# Patient Record
Sex: Female | Born: 1975 | Race: Black or African American | Hispanic: No | Marital: Single | Smoking: Never smoker
Health system: Southern US, Community
[De-identification: ages and names within clinical notes are randomized; demographics above are authoritative.]

## PROBLEM LIST (undated history)

## (undated) DIAGNOSIS — R600 Localized edema: Secondary | ICD-10-CM

## (undated) DIAGNOSIS — R7303 Prediabetes: Secondary | ICD-10-CM

## (undated) DIAGNOSIS — R079 Chest pain, unspecified: Secondary | ICD-10-CM

## (undated) DIAGNOSIS — E039 Hypothyroidism, unspecified: Secondary | ICD-10-CM

## (undated) DIAGNOSIS — M549 Dorsalgia, unspecified: Secondary | ICD-10-CM

## (undated) DIAGNOSIS — F32A Depression, unspecified: Secondary | ICD-10-CM

## (undated) DIAGNOSIS — K219 Gastro-esophageal reflux disease without esophagitis: Secondary | ICD-10-CM

## (undated) DIAGNOSIS — K5900 Constipation, unspecified: Secondary | ICD-10-CM

## (undated) DIAGNOSIS — M2550 Pain in unspecified joint: Secondary | ICD-10-CM

## (undated) DIAGNOSIS — I10 Essential (primary) hypertension: Secondary | ICD-10-CM

## (undated) DIAGNOSIS — F419 Anxiety disorder, unspecified: Secondary | ICD-10-CM

## (undated) DIAGNOSIS — E669 Obesity, unspecified: Secondary | ICD-10-CM

## (undated) DIAGNOSIS — G4730 Sleep apnea, unspecified: Secondary | ICD-10-CM

## (undated) DIAGNOSIS — R002 Palpitations: Secondary | ICD-10-CM

## (undated) HISTORY — DX: Sleep apnea, unspecified: G47.30

## (undated) HISTORY — PX: KELOID EXCISION: SHX1856

## (undated) HISTORY — DX: Localized edema: R60.0

## (undated) HISTORY — DX: Depression, unspecified: F32.A

## (undated) HISTORY — DX: Chest pain, unspecified: R07.9

## (undated) HISTORY — DX: Dorsalgia, unspecified: M54.9

## (undated) HISTORY — DX: Palpitations: R00.2

## (undated) HISTORY — DX: Obesity, unspecified: E66.9

## (undated) HISTORY — DX: Constipation, unspecified: K59.00

## (undated) HISTORY — DX: Pain in unspecified joint: M25.50

## (undated) HISTORY — DX: Prediabetes: R73.03

## (undated) HISTORY — DX: Gastro-esophageal reflux disease without esophagitis: K21.9

---

## 2006-05-22 ENCOUNTER — Other Ambulatory Visit: Admission: RE | Admit: 2006-05-22 | Discharge: 2006-05-22 | Payer: Self-pay | Admitting: Obstetrics and Gynecology

## 2008-02-06 ENCOUNTER — Encounter: Admission: RE | Admit: 2008-02-06 | Discharge: 2008-02-06 | Payer: Self-pay | Admitting: Obstetrics and Gynecology

## 2008-06-05 ENCOUNTER — Ambulatory Visit (HOSPITAL_COMMUNITY): Admission: RE | Admit: 2008-06-05 | Discharge: 2008-06-05 | Payer: Self-pay | Admitting: Obstetrics and Gynecology

## 2008-06-22 ENCOUNTER — Inpatient Hospital Stay (HOSPITAL_COMMUNITY): Admission: AD | Admit: 2008-06-22 | Discharge: 2008-06-22 | Payer: Self-pay | Admitting: Obstetrics and Gynecology

## 2008-06-29 ENCOUNTER — Ambulatory Visit (HOSPITAL_COMMUNITY): Admission: RE | Admit: 2008-06-29 | Discharge: 2008-06-29 | Payer: Self-pay | Admitting: Obstetrics and Gynecology

## 2008-07-02 ENCOUNTER — Inpatient Hospital Stay (HOSPITAL_COMMUNITY): Admission: AD | Admit: 2008-07-02 | Discharge: 2008-07-02 | Payer: Self-pay | Admitting: Obstetrics and Gynecology

## 2008-07-03 ENCOUNTER — Inpatient Hospital Stay (HOSPITAL_COMMUNITY)
Admission: RE | Admit: 2008-07-03 | Discharge: 2008-07-05 | Payer: Self-pay | Source: Ambulatory Visit | Admitting: Obstetrics and Gynecology

## 2008-07-22 ENCOUNTER — Encounter: Admission: RE | Admit: 2008-07-22 | Discharge: 2008-07-22 | Payer: Self-pay | Admitting: Obstetrics and Gynecology

## 2009-09-01 ENCOUNTER — Ambulatory Visit (HOSPITAL_COMMUNITY): Admission: RE | Admit: 2009-09-01 | Discharge: 2009-09-01 | Payer: Self-pay | Admitting: Obstetrics and Gynecology

## 2009-09-03 ENCOUNTER — Inpatient Hospital Stay (HOSPITAL_COMMUNITY): Admission: AD | Admit: 2009-09-03 | Discharge: 2009-09-03 | Payer: Self-pay | Admitting: Obstetrics and Gynecology

## 2009-09-05 ENCOUNTER — Encounter (INDEPENDENT_AMBULATORY_CARE_PROVIDER_SITE_OTHER): Payer: Self-pay | Admitting: Obstetrics and Gynecology

## 2009-09-05 ENCOUNTER — Inpatient Hospital Stay (HOSPITAL_COMMUNITY): Admission: AD | Admit: 2009-09-05 | Discharge: 2009-09-07 | Payer: Self-pay | Admitting: Obstetrics and Gynecology

## 2009-12-26 ENCOUNTER — Ambulatory Visit (HOSPITAL_BASED_OUTPATIENT_CLINIC_OR_DEPARTMENT_OTHER): Admission: RE | Admit: 2009-12-26 | Discharge: 2009-12-26 | Payer: Self-pay | Admitting: Family Medicine

## 2010-01-01 ENCOUNTER — Ambulatory Visit: Payer: Self-pay | Admitting: Internal Medicine

## 2010-07-14 ENCOUNTER — Emergency Department (HOSPITAL_COMMUNITY): Admission: EM | Admit: 2010-07-14 | Discharge: 2010-07-14 | Payer: Self-pay | Admitting: Emergency Medicine

## 2010-07-29 ENCOUNTER — Emergency Department (HOSPITAL_COMMUNITY): Admission: EM | Admit: 2010-07-29 | Discharge: 2010-07-29 | Payer: Self-pay | Admitting: Emergency Medicine

## 2010-12-29 LAB — BASIC METABOLIC PANEL
BUN: 12 mg/dL (ref 6–23)
CO2: 26 mEq/L (ref 19–32)
Creatinine, Ser: 0.74 mg/dL (ref 0.4–1.2)
GFR calc Af Amer: >60 mL/min (ref >60)
GFR calc non Af Amer: >60 mL/min (ref >60)
Glucose, Bld: 88 mg/dL (ref 70–99)
Potassium: 3.4 mEq/L — ABNORMAL LOW (ref 3.5–5.1)
Sodium: 136 mEq/L (ref 135–145)

## 2010-12-29 LAB — DIFFERENTIAL
Basophils Absolute: 0 10*3/uL (ref 0.0–0.1)
Eosinophils Relative: 2 % (ref 0–5)
Lymphs Abs: 2.1 10*3/uL (ref 0.7–4.0)

## 2010-12-29 LAB — CBC
HCT: 41.6 % (ref 36.0–46.0)
Hemoglobin: 13.9 g/dL (ref 12.0–15.0)
MCH: 28.5 pg (ref 26.0–34.0)
MCHC: 33.4 g/dL (ref 30.0–36.0)
MCV: 85.3 fL (ref 78.0–100.0)
RBC: 4.87 MIL/uL (ref 3.87–5.11)
RDW: 13.1 % (ref 11.5–15.5)
WBC: 6.8 10*3/uL (ref 4.0–10.5)

## 2010-12-29 LAB — POCT CARDIAC MARKERS: CKMB, poc: 1.5 ng/mL (ref 1.0–8.0)

## 2011-01-18 LAB — CBC
Hemoglobin: 10.9 g/dL — ABNORMAL LOW (ref 12.0–15.0)
MCHC: 33.1 g/dL (ref 30.0–36.0)
MCV: 89.3 fL (ref 78.0–100.0)
MCV: 90.7 fL (ref 78.0–100.0)
RDW: 13.8 % (ref 11.5–15.5)
WBC: 14.6 10*3/uL — ABNORMAL HIGH (ref 4.0–10.5)
WBC: 8.9 10*3/uL (ref 4.0–10.5)

## 2011-01-18 LAB — COMPREHENSIVE METABOLIC PANEL
ALT: 15 U/L (ref 0–35)
Albumin: 3.2 g/dL — ABNORMAL LOW (ref 3.5–5.2)
Alkaline Phosphatase: 131 U/L — ABNORMAL HIGH (ref 39–117)
BUN: 7 mg/dL (ref 6–23)
Creatinine, Ser: 0.59 mg/dL (ref 0.4–1.2)
GFR calc Af Amer: >60 mL/min (ref >60)
GFR calc non Af Amer: >60 mL/min (ref >60)
Sodium: 135 mEq/L (ref 135–145)
Total Protein: 6.8 g/dL (ref 6.0–8.3)

## 2011-01-18 LAB — URIC ACID: Uric Acid, Serum: 4.7 mg/dL (ref 2.4–7.0)

## 2011-01-18 LAB — LACTATE DEHYDROGENASE: LDH: 188 U/L (ref 94–250)

## 2011-01-18 LAB — RPR: RPR Ser Ql: NONREACTIVE

## 2011-02-28 NOTE — H&P (Signed)
NAME:  Julia Mccoy, Julia Mccoy NO.:  [id number]   MEDICAL RECORD NO.:  [id number]          PATIENT TYPE:  MAT   LOCATION:  [facility]                          FACILITY:  [facility]   PHYSICIAN:  Sherron Monday, MD        DATE OF BIRTH:  Apr 11, 1976   DATE OF ADMISSION:  07/02/2008  DATE OF DISCHARGE:                              HISTORY & PHYSICAL   .   ADMITTING DIAGNOSES:  1. Intrauterine pregnancy at term.  2. Chronic hypertension.   PROCEDURE PLANNED:  Induction of labor.   HISTORY OF PRESENT ILLNESS:  A 35 year old African American female G1,  P0 who presents for induction of labor secondary to favorable cervix and  chronic hypertension with mild proteinuria.  She states she has had good  fetal movement, no loss of fluid, no vaginal bleeding, occasional  contractions.  Her pregnancy has been followed closely secondary to  chronic hypertension.  She has been on labetalol 300 twice daily with  decent control of her blood pressures.  She has also been monitored with  biweekly NSTs and frequent BPP secondary to the nonreactivity of the  NST.    Past Medical History:  1. Chronic Hypertension  2. Obesity  3. thyroid disease   Past Surgical History:  None   Past Ob/Gyn History  G1 is the present pregnancy  no abnormal pap smears or sexually transmitted diseases.   MEDICATIONS:  1. Labetalol 300 twice daily.  2. Synthroid.   ALLERGIES:  No known drug allergies.   SOCIAL HISTORY:  Denies alcohol, tobacco or drug use.  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  Patient denies a family history.   PRENATAL LABS:  Are the following:  First trimester screen is negative.  Hemoglobin 11.1, platelets 182,000.  Blood type O+, antibody screen  negative, sickle cell screen normal.  Pap smear on September 2008 was  within normal limits.  Gonorrhea negative, chlamydia negative, RPR  nonreactive, rubella immune, hepatitis B surface antigen negative, HIV  negative, TSH was within  normal limits, cystic fibrosis screen was  negative, normal nuchal thickness, Glucola of 119.  Group B strep was  positive.  Baseline 24-hour urine revealed a total protein of 137 mg and  an adequate sample ultrasound at 19 weeks revealed [facility] of July 09, 2008.  Normal anatomy, somewhat a limited heart anatomy, anterior  placenta and a female fetus.  Cervical length was noted to be shortened in  her prenatal care.  Her anatomy was followed up and had continued to be  of limited evaluation.  A followup ultrasound at 20 weeks revealed  normal heart anatomy and stable cervical length.  BPPs were normal  throughout her prenatal care.    Physical Exam:   Afebrile, Vital signs stable  General - no apparent distress  Cardiovascular - regular rate and rhythm  Lungs clear to auscultation Bilaterally  Abdomen soft, fundus nontender  Extremity symmetric, nontender   Fetal Heart tones 140's   ASSESSMENT/PLAN:  A 35 year old African American female G1 who will be  admitted for induction of labor secondary  to favorable cervix and  chronic hypertension with mild proteinuria.  She will be admitted on the  18th.  Her water will be broken and she will be started on Pitocin to  augment her labor.  She voices understanding to all this and wishes to  proceed.      Sherron Monday, MD  Electronically Signed     JB/MEDQ  D:  07/02/2008  T:  07/02/2008  Job:  [id number]

## 2011-02-28 NOTE — Discharge Summary (Signed)
NAME:  Julia Mccoy, SCHLIE NO.:  [id number]   MEDICAL RECORD NO.:  [id number]          PATIENT TYPE:  INP   LOCATION:  9136                          FACILITY:  [facility]   PHYSICIAN:  Sherron Monday, MD        DATE OF BIRTH:  [birth year]   DATE OF ADMISSION:  07/03/2008  DATE OF DISCHARGE:  07/05/2008                               DISCHARGE SUMMARY   ADMITTING DIAGNOSES:  Intrauterine pregnancy at term, chronic  hypertension, for induction of labor secondary to favorable cervix and  chronic hypertension.   DISCHARGE DIAGNOSES:  Intrauterine pregnancy at term, chronic  hypertension, for induction of labor secondary to favorable cervix and  chronic hypertension, infant delivered by a spontaneous vaginal  delivery.   HISTORY OF PRESENT ILLNESS:  A 35 year old G1, P0 who presents for labor  secondary to favorable cervix and chronic hypertension with mild  proteinuria.  She states she has had good fetal movement, no loss of  fluid, no vaginal bleeding and occasional contractions.  Her pregnancy  is followed closely secondary to chronic hypertension and during her  pregnancy she has been on labetalol twice daily for decent control of  her blood pressures.  She has been monitored with biweekly NST and  frequent ultrasound for growth or frequent BPP secondary to  nonreactivity or the nonstress test.   PAST MEDICAL HISTORY:  Significant for chronic hypertension and thyroid  disease.   PAST SURGICAL HISTORY:  She has had surgery on her keloids.   PAST OB/GYN HISTORY:  G1 is the present pregnancy.  She did have an  abnormal Pap smear and colposcopy.  No sexually transmitted diseases.   MEDICATIONS:  1. Labetalol 600 twice daily.  2. Prenatal vitamin.  3. Levothyroxine.   ALLERGIES:  No known drug allergies.   SOCIAL HISTORY:  She denies alcohol, tobacco, or drug use.  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   FAMILY HISTORY:  Her parents have chronic hypertension and aunt had  colon and breast cancer.  Father with diabetes.   PRENATAL LABORATORY DATA:  First trimester screen was within normal  limits.  Hemoglobin 11.1 and platelets 182,000.  O positive.  Antibody  screen negative.  Sickle cell within normal limits.  Pap smear within  normal limits.  Gonorrhea negative.  Chlamydia negative.  RPR  nonreactive.  Rubella immune.  Hepatitis C surface antigen negative.  HIV negative.  TSH within normal limits.  Cystic fibrosis screen  negative.  Normal nuchal thickness.  Glucose 119.  Group strep B screen  was positive.  Baseline 24-hour urine revealed a total protein of 137 mg  and an adequate sample.   Ultrasound 19 weeks revealed [facility] of July 09, 2008, normal anatomy,  somewhat limited herniotomy.  Anterior placenta and  female fetus.  Cervical length was noted to be shortened in her prenatal care.  Anatomy  and growth were followed.  BCCs were normal as well as her prenatal  care.   PHYSICAL EXAMINATION:  On admission, her exam was benign.  Fetal heart  tones were reactive.  Vaginal exam was 2-3  cm dilated, 50% effaced, and  -2 station. Her labor was monitored closely.  She was given Pitocin to  augment her labor as well as after the penicillin had been given for the  group B strep, an artifical rupture of membranes was performed.  Therefore by confirming rupture of membranes that she had during her  intrapartum course.  Scalp lead was also placed.  She was monitored and  progressed all the way to complete +2 , pushed well for approximately 30  minutes to deliver a viable female infant at 2321 with Apgars of 9 at 1  minute and 9 at 5 minutes and a weight of 7 pounds 1 ounce, and infant  delivered in LOP.  OB laceration was noted, second degree perineal and  the right labial repaired with 3-0 Vicryl in a typical fashion.  Placenta was delivered at 23:25 and was inspected intact.  EBL was  approximately 500 mL.  Postpartum course, circumcision was performed  for  the female infant without complications after discussing the risks,  benefits, and alternatives with the patient.  Postpartum course was  relatively uncomplicated.  Her blood pressure remained well controlled  and she continued on her labetalol.  As she is breast-feeding she will  continue on 300 twice daily of labetalol.  She was doing well on  postpartum day 2 with her pain well controlled, tolerating a regular  diet.  Normal lochia and she is working on breast-feeding.  She will be  discharged home with a prescription for Motrin, Vicodin and prenatal  vitamins as well as instructions on labetalol.  She will return to the  office in 2 weeks for a blood pressure check.  She was given routine  discharge instructions and advised to call if any questions or problems.      Sherron Monday, MD  Electronically Signed     JB/MEDQ  D:  07/05/2008  T:  07/06/2008  Job:  [id number]

## 2011-07-17 LAB — COMPREHENSIVE METABOLIC PANEL
ALT: 18
Albumin: 3.1 — ABNORMAL LOW
Albumin: 3.1 — ABNORMAL LOW
Alkaline Phosphatase: 98
BUN: 7
CO2: 23
Chloride: 106
Creatinine, Ser: 0.53
Creatinine, Ser: 0.55
GFR calc Af Amer: >60
Potassium: 3.8
Total Protein: 6.2

## 2011-07-17 LAB — CBC
HCT: 32.8 — ABNORMAL LOW
HCT: 34.5 — ABNORMAL LOW
Hemoglobin: 11 — ABNORMAL LOW
MCV: 88.8
Platelets: 151
RBC: 3.93
WBC: 4.6
WBC: 4.9
WBC: 9.5

## 2011-07-17 LAB — URIC ACID
Uric Acid, Serum: 4.8
Uric Acid, Serum: 5.2

## 2011-07-17 LAB — RPR: RPR Ser Ql: NONREACTIVE

## 2012-01-30 ENCOUNTER — Encounter (HOSPITAL_BASED_OUTPATIENT_CLINIC_OR_DEPARTMENT_OTHER): Payer: Self-pay | Admitting: *Deleted

## 2012-01-30 ENCOUNTER — Emergency Department (HOSPITAL_BASED_OUTPATIENT_CLINIC_OR_DEPARTMENT_OTHER)
Admission: EM | Admit: 2012-01-30 | Discharge: 2012-01-30 | Disposition: A | Discharge status: Home / Self Care | Payer: 59 | Attending: Emergency Medicine | Admitting: Emergency Medicine

## 2012-01-30 DIAGNOSIS — E079 Disorder of thyroid, unspecified: Secondary | ICD-10-CM | POA: Insufficient documentation

## 2012-01-30 DIAGNOSIS — I10 Essential (primary) hypertension: Secondary | ICD-10-CM | POA: Insufficient documentation

## 2012-01-30 DIAGNOSIS — R51 Headache: Secondary | ICD-10-CM | POA: Insufficient documentation

## 2012-01-30 DIAGNOSIS — R350 Frequency of micturition: Secondary | ICD-10-CM

## 2012-01-30 HISTORY — DX: Essential (primary) hypertension: I10

## 2012-01-30 LAB — URINALYSIS, ROUTINE W REFLEX MICROSCOPIC
Glucose, UA: NEGATIVE mg/dL
Hgb urine dipstick: NEGATIVE
Specific Gravity, Urine: 1.026 (ref 1.005–1.030)
pH: 5 (ref 5.0–8.0)

## 2012-01-30 LAB — DIFFERENTIAL
Eosinophils Relative: 0 % (ref 0–5)
Lymphocytes Relative: 21 % (ref 12–46)
Lymphs Abs: 0.8 10*3/uL (ref 0.7–4.0)
Monocytes Absolute: 0.6 10*3/uL (ref 0.1–1.0)

## 2012-01-30 LAB — COMPREHENSIVE METABOLIC PANEL
ALT: 20 U/L (ref 0–35)
CO2: 27 mEq/L (ref 19–32)
Calcium: 9.1 mg/dL (ref 8.4–10.5)
Chloride: 102 mEq/L (ref 96–112)
Creatinine, Ser: 0.8 mg/dL (ref 0.50–1.10)
GFR calc Af Amer: >90 mL/min (ref >90)
GFR calc non Af Amer: >90 mL/min (ref >90)
Glucose, Bld: 87 mg/dL (ref 70–99)
Sodium: 138 mEq/L (ref 135–145)
Total Bilirubin: 0.4 mg/dL (ref 0.3–1.2)

## 2012-01-30 LAB — CBC
HCT: 37.6 % (ref 36.0–46.0)
MCV: 85.3 fL (ref 78.0–100.0)
RBC: 4.41 MIL/uL (ref 3.87–5.11)
WBC: 3.8 10*3/uL — ABNORMAL LOW (ref 4.0–10.5)

## 2012-01-30 NOTE — ED Notes (Signed)
States she has had urinary symptoms, aching all over and headache. Urine sent from triage.

## 2012-01-30 NOTE — ED Notes (Signed)
Pt presents with multiple sx including frequent urination weakness, HA and "felt feverish" sx began this weekend

## 2012-01-30 NOTE — ED Provider Notes (Signed)
History     CSN: [id number]  Arrival date & time 01/30/12  2120   First MD Initiated Contact with Patient 01/30/12 2202      Chief Complaint  Patient presents with  . Urinary Frequency    (Consider location/radiation/quality/duration/timing/severity/associated sxs/prior treatment) Patient is a 36 y.o. female presenting with frequency. The history is provided by the patient.  Urinary Frequency This is a new problem. Episode onset: 3 days ago. The problem occurs constantly. The problem has been gradually worsening. Associated symptoms include headaches. Pertinent negatives include no chest pain, no abdominal pain and no shortness of breath. The symptoms are aggravated by nothing. The symptoms are relieved by nothing. She has tried nothing for the symptoms.    Past Medical History  Diagnosis Date  . Hypertension   . Thyroid disease     History reviewed. No pertinent past surgical history.  History reviewed. No pertinent family history.  History  Substance Use Topics  . Smoking status: Never Smoker   . Smokeless tobacco: Not on file  . Alcohol Use: No    OB History    Grav Para Term Preterm Abortions TAB SAB Ect Mult Living                  Review of Systems  Respiratory: Negative for shortness of breath.   Cardiovascular: Negative for chest pain.  Gastrointestinal: Negative for abdominal pain.  Genitourinary: Positive for frequency.  Neurological: Positive for headaches.  All other systems reviewed and are negative.    Allergies  Review of patient's allergies indicates no known allergies.  Home Medications   Current Outpatient Rx  Name Route Sig Dispense Refill  . AMLODIPINE BESYLATE 10 MG PO TABS Oral Take 10 mg by mouth daily.    Marland Kitchen LEVONORGESTREL 20 MCG/24HR IU IUD Intrauterine 1 each by Intrauterine route once. Inserted January 2011    . LEVOTHYROXINE SODIUM 88 MCG PO TABS Oral Take 88 mcg by mouth daily.    Marland Kitchen LOSARTAN POTASSIUM-HCTZ 50-12.5 MG PO TABS  Oral Take 1 tablet by mouth daily.      BP 143/101  Pulse 100  Temp(Src) 99.9 F (37.7 C) (Oral)  Resp 20  SpO2 100%  LMP 01/16/2012  Physical Exam  Nursing note and vitals reviewed. Constitutional: She is oriented to person, place, and time. She appears well-developed and well-nourished. No distress.  HENT:  Head: Normocephalic and atraumatic.  Neck: Normal range of motion. Neck supple.  Cardiovascular: Normal rate and regular rhythm.  Exam reveals no gallop and no friction rub.   No murmur heard. Pulmonary/Chest: Effort normal and breath sounds normal. No respiratory distress. She has no wheezes.  Abdominal: Soft. Bowel sounds are normal. She exhibits no distension. There is no tenderness.  Musculoskeletal: Normal range of motion.  Neurological: She is alert and oriented to person, place, and time.  Skin: Skin is warm and dry. She is not diaphoretic.    ED Course  Procedures (including critical care time)  Labs Reviewed  URINALYSIS, ROUTINE W REFLEX MICROSCOPIC - Abnormal; Notable for the following:    Color, Urine AMBER (*) BIOCHEMICALS MAY BE AFFECTED BY COLOR   Bilirubin Urine SMALL (*)    Ketones, ur 15 (*)    All other components within normal limits  PREGNANCY, URINE  CBC  DIFFERENTIAL  COMPREHENSIVE METABOLIC PANEL   No results found.   No diagnosis found.    MDM  The labs look okay.  The patient's main complaint is urinary frequency.  I was concerned about her sugar, however there was no glucose in the urine and the cmp showed a glucose of 87.  Her symptoms are difficult to explain but do not appear emergent.  She will be discharged to home with follow up prn if her symptoms do not resolve or worsen.          Geoffery Lyons, MD 01/30/12 [phone]

## 2012-01-30 NOTE — Discharge Instructions (Signed)
Urinary Frequency The number of times a normal person urinates depends upon how much liquid they take in and how much liquid they are losing. If the temperature is hot and there is high humidity then the person will sweat more and usually breathe a little more frequently. These factors decrease the amount of frequency of urination that would be considered normal. The amount you drink is easily determined, but the amount of fluid lost is sometimes more difficult to calculate.  Fluid is lost in two ways:  Sensible fluid loss is usually measured by the amount of urine that you get rid of. Losses of fluid can also occur with diarrhea.   Insensible fluid loss is more difficult to measure. It is caused by evaporation. Insensible loss of fluid occurs through breathing and sweating. It usually ranges from a little less than a quart to a little more than a quart of fluid a day.  In normal temperatures and activity levels the average person may urinate 4 to 7 times in a 24-hour period. Needing to urinate more often than that could indicate a problem. If one urinates 4 to 7 times in 24 hours and has large volumes each time, that could indicate a different problem from one who urinates 4 to 7 times a day and has small volumes. The time of urinating is also an important. Most urinating should be done during the waking hours. Getting up at night to urinate frequently can indicate some problems. CAUSES  The bladder is the organ in your lower abdomen that holds urine. Like a balloon, it swells some as it fills up. Your nerves sense this and tell you it is time to head for the bathroom. There are a number of reasons that you might feel the need to urinate more often than usual. They include:  Urinary tract infection. This is usually associated with other signs such as burning when you urinate.   In men, problems with the prostate (a walnut-size gland that is located near the tube that carries urine out of your body).  There are two reasons why the prostate can cause an increased frequency of urination:   An enlarged prostate that does not let the bladder empty well. If the bladder only half empties when you urinate then it only has half the capacity to fill before you have to urinate again.   The nerves in the bladder become more hypersensitive with an increased size of the prostate even if the bladder empties completely.   Pregnancy.   Obesity. Excess weight is more likely to cause a problem for women more than for men.   Bladder stones or other bladder problems.   Caffeine.   Alcohol.   Medications. For example, drugs that help the body get rid of extra fluid (diuretics) increase urine production. Some other medicines must be taken with lots of fluids.   Muscle or nerve weakness. This might be the result of a spinal cord injury, a stroke, multiple sclerosis or Parkinson's disease.   Long-standing diabetes can decrease the sensation of the bladder. This loss of sensation makes it harder to sense the bladder needs to be emptied. Over a period of years the bladder is stretched out by constant overfilling. This weakens the bladder muscles so that the bladder does not empty well and has less capacity to fill with new urine.   Interstitial cystitis (also called painful bladder syndrome). This condition develops because the tissues that line the insider of the bladder are inflamed (  inflammation is the body's way of reacting to injury or infection). It causes pain and frequent urination. It occurs in women more often than in men.  DIAGNOSIS   To decide what might be causing your urinary frequency, your healthcare provider will probably:   Ask about symptoms you have noticed.   Ask about your overall health. This will include questions about any medications you are taking.   Do a physical examination.   Order some tests. These might include:   A blood test to check for diabetes or other health issues  that could be contributing to the problem.   Urine testing. This could measure the flow of urine and the pressure on the bladder.   A test of your neurological system (the brain, spinal cord and nerves). This is the system that senses the need to urinate.   A bladder test to check whether it is emptying completely when you urinate.   Cytoscopy. This test uses a thin tube with a tiny camera on it. It offers a look inside your urethra and bladder to see if there are problems.   Imaging tests. You might be given a contrast dye and then asked to urinate. X-rays are taken to see how your bladder is working.  TREATMENT  It is important for you to be evaluated to determine if the amount or frequency that you have is unusual or abnormal. If it is found to be abnormal the cause should be determined and this can usually be found out easily. Depending upon the cause treatment could include medication, stimulation of the nerves, or surgery. There are not too many things that you can do as an individual to change your urinary frequency. It is important that you balance the amount of fluid intake needed to compensate for your activity and the temperature. Medical problems will be diagnosed and taken care of by your physician. There is no particular bladder training such as Kegel's exercises that you can do to help urinary frequency. This is an exercise this is usually done for people who have leaking of urine when they laugh cough or sneeze. HOME CARE INSTRUCTIONS   Take any medications your healthcare provider prescribed or suggested. Follow the directions carefully.   Practice any lifestyle changes that are recommended. These might include:   Drinking less fluid or drinking at different times of the day. If you need to urinate often during the night, for example, you may need to stop drinking fluids early in the evening.   Cutting down on caffeine or alcohol. They both can make you need to urinate more  often than normal. Caffeine is found in coffee, tea and sodas.   Losing weight, if that is recommended.   Keep a journal or a log. You might be asked to record how much you drink and when and when you feel the need to urinate. This will also help evaluate how well the treatment provided by your physician is working.  SEEK MEDICAL CARE IF:   Your need to urinate often gets worse.   You feel increased pain or irritation when you urinate.   You notice blood in your urine.   You have questions about any medications that your healthcare provider recommended.   You notice blood, pus or swelling at the site of any test or treatment procedure.   You develop a fever of more than 100.5 F (38.1 C).  SEEK IMMEDIATE MEDICAL CARE IF:  You develop a fever of more than 102.0   F (38.9 C). Document Released: 07/29/2009 Document Revised: 09/21/2011 Document Reviewed: 07/29/2009 Exit[employer] Patient Information 2012 [employer]. 

## 2014-07-24 ENCOUNTER — Encounter: Payer: Self-pay | Admitting: Dietician

## 2014-07-24 ENCOUNTER — Encounter: Payer: 59 | Attending: Family Medicine | Admitting: Dietician

## 2014-07-24 DIAGNOSIS — Z713 Dietary counseling and surveillance: Secondary | ICD-10-CM | POA: Diagnosis not present

## 2014-07-24 DIAGNOSIS — E669 Obesity, unspecified: Secondary | ICD-10-CM | POA: Diagnosis present

## 2014-07-24 NOTE — Progress Notes (Signed)
  Medical Nutrition Therapy:  Appt start time: 1115 end time:  1200.   Assessment:  Primary concerns today: Julia Mccoy was referred for weight management and hypertension. 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 She picks up her kids and goes through their night routine and then she might do work at home in the evening.  She lives with her children.  She eats out 5 times a week to places like [employer], fast-food, greek food, pizza, or Congochinese food.    Learning Readiness:  Ready  MEDICATIONS: see list   DIETARY INTAKE:  Usual eating pattern includes 3 meals and 0 snacks per day.  24-hr recall:  B (9:30-10 AM): yogurt and belvita crackers and fruit if she has a morning meeting, [place] if not Snk: none  L (1 PM): out to eat, mushroom swiss burger, potato wedges with sweet tea Snk ( PM): none D (12 AM): rice, chicken Snk ( PM): none Beverages: sweet tea at meals  Usual physical activity: none currently  Progress Towards Goal(s):  In progress.   Nutritional Diagnosis:  NB-1.1 Food and nutrition-related knowledge deficit As related to no prior formal nutrition education.  As evidenced by patient report.     Intervention:  Nutrition education and counseling for healthy, sustainable weight loss and hypertension management through dietary interventions.  Goals: Make breakfast at home each morning during the week (5 days a week). Pack your lunch at least 3 days a week.   Try to limit sodium to 500-600 mg per meal and 100-200 mg per snack. (<2,300 mg per day) Begin exercise routine.  Walk 15 min in the morning 3 days a week.  Teaching Method Utilized: Visual Auditory  Handouts given during visit include:  Hypertension DASH tips  Low Sodium Flavoring Options  MyPlate Portion Method  Fast Food Options  Heart Healthy Foods  Barriers to  learning/adherence to lifestyle change: none  Demonstrated degree of understanding via:  Teach Back   Monitoring/Evaluation:  Dietary intake, exercise, labs, and body weight in 2 month(s).

## 2014-07-24 NOTE — Patient Instructions (Signed)
Make breakfast at home each morning during the week (5 days a week). Pack your lunch at least 3 days a week.   Try to limit sodium to 500-600 mg per meal and 100-200 mg per snack. Begin exercise routine.  Walk 15 min in the morning 3 days a week.

## 2014-10-02 ENCOUNTER — Ambulatory Visit: Payer: 59 | Admitting: Dietician

## 2015-08-26 ENCOUNTER — Emergency Department (HOSPITAL_BASED_OUTPATIENT_CLINIC_OR_DEPARTMENT_OTHER)
Admission: EM | Admit: 2015-08-26 | Discharge: 2015-08-26 | Disposition: A | Discharge status: Home / Self Care | Payer: Medicaid Other | Attending: Emergency Medicine | Admitting: Emergency Medicine

## 2015-08-26 ENCOUNTER — Encounter (HOSPITAL_BASED_OUTPATIENT_CLINIC_OR_DEPARTMENT_OTHER): Payer: Self-pay

## 2015-08-26 DIAGNOSIS — Y9389 Activity, other specified: Secondary | ICD-10-CM | POA: Insufficient documentation

## 2015-08-26 DIAGNOSIS — X509XXA Other and unspecified overexertion or strenuous movements or postures, initial encounter: Secondary | ICD-10-CM | POA: Insufficient documentation

## 2015-08-26 DIAGNOSIS — E079 Disorder of thyroid, unspecified: Secondary | ICD-10-CM | POA: Insufficient documentation

## 2015-08-26 DIAGNOSIS — Y990 Civilian activity done for income or pay: Secondary | ICD-10-CM | POA: Diagnosis not present

## 2015-08-26 DIAGNOSIS — Y9289 Other specified places as the place of occurrence of the external cause: Secondary | ICD-10-CM | POA: Insufficient documentation

## 2015-08-26 DIAGNOSIS — S3992XA Unspecified injury of lower back, initial encounter: Secondary | ICD-10-CM | POA: Diagnosis present

## 2015-08-26 DIAGNOSIS — E669 Obesity, unspecified: Secondary | ICD-10-CM | POA: Insufficient documentation

## 2015-08-26 DIAGNOSIS — S39012A Strain of muscle, fascia and tendon of lower back, initial encounter: Secondary | ICD-10-CM | POA: Diagnosis not present

## 2015-08-26 DIAGNOSIS — I10 Essential (primary) hypertension: Secondary | ICD-10-CM | POA: Diagnosis not present

## 2015-08-26 DIAGNOSIS — Z79899 Other long term (current) drug therapy: Secondary | ICD-10-CM | POA: Diagnosis not present

## 2015-08-26 MED ORDER — CYCLOBENZAPRINE HCL 5 MG PO TABS: 5.0000 mg | ORAL_TABLET | Freq: Two times a day (BID) | ORAL | Status: DC | PRN

## 2015-08-26 NOTE — ED Notes (Signed)
Reports back pain that started on Monday and reports its from bending down under a desk. Denies numbness or tingling down either leg.

## 2015-08-26 NOTE — ED Provider Notes (Signed)
CSN: [id number]     Arrival date & time 08/26/15  1953 History   First MD Initiated Contact with Patient 08/26/15 2033     Chief Complaint  Patient presents with  . Back Pain     (Consider location/radiation/quality/duration/timing/severity/associated sxs/prior Treatment) HPI Comments: Pt comes in with c/o left lower back pain that started after climbing under her desk at work. Denies numbness, weakness or incontinence. She states that she has history of similar symptoms and it usually resolves on its own. She has not taken anything for the symptoms.   The history is provided by the patient. No language interpreter was used.    Past Medical History  Diagnosis Date  . Hypertension   . Thyroid disease   . Obesity    Past Surgical History  Procedure Laterality Date  . Keloid excision     No family history on file. Social History  Substance Use Topics  . Smoking status: Never Smoker   . Smokeless tobacco: None  . Alcohol Use: No   OB History    No data available     Review of Systems  All other systems reviewed and are negative.     Allergies  Review of patient's allergies indicates no known allergies.  Home Medications   Prior to Admission medications   Medication Sig Start Date End Date Taking? Authorizing Provider  amLODipine (NORVASC) 10 MG tablet Take 10 mg by mouth daily.    Historical Provider, MD  cyclobenzaprine (FLEXERIL) 5 MG tablet Take 1 tablet (5 mg total) by mouth 2 (two) times daily as needed. 08/26/15   Teressa LowerVrinda Freddy Kinne, NP  levonorgestrel (MIRENA) 20 MCG/24HR IUD 1 each by Intrauterine route once. Inserted January 2011    Historical Provider, MD  levothyroxine (SYNTHROID, LEVOTHROID) 88 MCG tablet Take 88 mcg by mouth daily.    Historical Provider, MD  losartan-hydrochlorothiazide (HYZAAR) 50-12.5 MG per tablet Take 1 tablet by mouth daily.    Historical Provider, MD   BP 130/80 mmHg  Pulse 84  Temp(Src) 98.4 F (36.9 C) (Oral)  Resp 18  Ht  5\' 11"  (1.803 m)  Wt 350 lb (158.759 kg)  BMI 48.84 kg/m2  SpO2 99% Physical Exam  Constitutional: She is oriented to person, place, and time. She appears well-developed and well-nourished.  Cardiovascular: Normal rate and regular rhythm.   Pulmonary/Chest: Effort normal and breath sounds normal.  Abdominal: Soft. Bowel sounds are normal. There is no tenderness.  Musculoskeletal:  Left lumbar paraspinal tenderness.able to do bilateral straight leg raises  Neurological: She is alert and oriented to person, place, and time.  Skin: Skin is warm and dry.  Nursing note and vitals reviewed.   ED Course  Procedures (including critical care time) Labs Review Labs Reviewed - No data to display  Imaging Review No results found. I have personally reviewed and evaluated these images and lab results as part of my medical decision-making.   EKG Interpretation None      MDM   Final diagnoses:  Lumbar strain, initial encounter    Don't think any imaging is needed at this time. Pt is neurologically intact.  Will given flexeril. Discussed follow up and return precautions with pt    Teressa LowerVrinda Andreya Lacks, NP 08/26/15 2056  Benjiman CoreNathan Yvette Roark, MD 08/26/15 [phone]

## 2015-08-26 NOTE — Discharge Instructions (Signed)
Muscle Strain °A muscle strain (pulled muscle) happens when a muscle is stretched beyond normal length. It happens when a sudden, violent force stretches your muscle too far. Usually, a few of the fibers in your muscle are torn. Muscle strain is common in athletes. Recovery usually takes 1-2 weeks. Complete healing takes 5-6 weeks.  °HOME CARE  °· Follow the PRICE method of treatment to help your injury get better. Do this the first 2-3 days after the injury: °¨ Protect. Protect the muscle to keep it from getting injured again. °¨ Rest. Limit your activity and rest the injured body part. °¨ Ice. Put ice in a plastic bag. Place a towel between your skin and the bag. Then, apply the ice and leave it on from 15-20 minutes each hour. After the third day, switch to moist heat packs. °¨ Compression. Use a splint or elastic bandage on the injured area for comfort. Do not put it on too tightly. °¨ Elevate. Keep the injured body part above the level of your heart. °· Only take medicine as told by your doctor. °· Warm up before doing exercise to prevent future muscle strains. °GET HELP IF:  °· You have more pain or puffiness (swelling) in the injured area. °· You feel numbness, tingling, or notice a loss of strength in the injured area. °MAKE SURE YOU:  °· Understand these instructions. °· Will watch your condition. °· Will get help right away if you are not doing well or get worse. °  °This information is not intended to replace advice given to you by your health care provider. Make sure you discuss any questions you have with your health care provider. °  °Document Released: 07/11/2008 Document Revised: 07/23/2013 Document Reviewed: 05/01/2013 °Elsevier Interactive Patient Education ©2016 [employer] ° °

## 2015-09-24 ENCOUNTER — Emergency Department (HOSPITAL_BASED_OUTPATIENT_CLINIC_OR_DEPARTMENT_OTHER)
Admission: EM | Admit: 2015-09-24 | Discharge: 2015-09-24 | Disposition: A | Discharge status: Home / Self Care | Payer: Medicaid Other | Attending: Emergency Medicine | Admitting: Emergency Medicine

## 2015-09-24 ENCOUNTER — Encounter (HOSPITAL_BASED_OUTPATIENT_CLINIC_OR_DEPARTMENT_OTHER): Payer: Self-pay | Admitting: *Deleted

## 2015-09-24 ENCOUNTER — Emergency Department (HOSPITAL_BASED_OUTPATIENT_CLINIC_OR_DEPARTMENT_OTHER): Payer: Medicaid Other

## 2015-09-24 DIAGNOSIS — J208 Acute bronchitis due to other specified organisms: Secondary | ICD-10-CM | POA: Insufficient documentation

## 2015-09-24 DIAGNOSIS — R0789 Other chest pain: Secondary | ICD-10-CM | POA: Insufficient documentation

## 2015-09-24 DIAGNOSIS — Z79899 Other long term (current) drug therapy: Secondary | ICD-10-CM | POA: Insufficient documentation

## 2015-09-24 DIAGNOSIS — I10 Essential (primary) hypertension: Secondary | ICD-10-CM | POA: Insufficient documentation

## 2015-09-24 DIAGNOSIS — Z7951 Long term (current) use of inhaled steroids: Secondary | ICD-10-CM | POA: Insufficient documentation

## 2015-09-24 DIAGNOSIS — R059 Cough, unspecified: Secondary | ICD-10-CM

## 2015-09-24 DIAGNOSIS — R05 Cough: Secondary | ICD-10-CM | POA: Diagnosis present

## 2015-09-24 DIAGNOSIS — E669 Obesity, unspecified: Secondary | ICD-10-CM | POA: Insufficient documentation

## 2015-09-24 DIAGNOSIS — E079 Disorder of thyroid, unspecified: Secondary | ICD-10-CM | POA: Diagnosis not present

## 2015-09-24 MED ORDER — IBUPROFEN 800 MG PO TABS
800.0000 mg | ORAL_TABLET | Freq: Once | ORAL | Status: AC
  Administered 2015-09-24: 800 mg via ORAL
  Filled 2015-09-24: qty 1

## 2015-09-24 MED ORDER — ALBUTEROL SULFATE HFA 108 (90 BASE) MCG/ACT IN AERS
2.0000 | INHALATION_SPRAY | RESPIRATORY_TRACT | Status: DC | PRN
  Administered 2015-09-24: 2 via RESPIRATORY_TRACT
  Filled 2015-09-24: qty 6.7

## 2015-09-24 MED ORDER — BENZONATATE 100 MG PO CAPS: 200.0000 mg | ORAL_CAPSULE | Freq: Two times a day (BID) | ORAL | Status: DC | PRN

## 2015-09-24 MED ORDER — AEROCHAMBER PLUS FLO-VU MEDIUM MISC
1.0000 | Freq: Once | Status: DC
  Filled 2015-09-24: qty 1

## 2015-09-24 MED ORDER — IBUPROFEN 800 MG PO TABS: 800.0000 mg | ORAL_TABLET | Freq: Three times a day (TID) | ORAL | Status: DC

## 2015-09-24 MED ORDER — FLUTICASONE PROPIONATE 50 MCG/ACT NA SUSP: 2.0000 | Freq: Every day | NASAL | Status: DC

## 2015-09-24 NOTE — ED Provider Notes (Signed)
CSN: [id number]     Arrival date & time 09/24/15  1924 History   First MD Initiated Contact with Patient 09/24/15 1950     Chief Complaint  Patient presents with  . Cough     (Consider location/radiation/quality/duration/timing/severity/associated sxs/prior Treatment) Patient is a 39 y.o. female presenting with cough. The history is provided by the patient and medical records. No language interpreter was used.  Cough Associated symptoms: no chest pain, no chills, no ear pain, no fever, no headaches, no myalgias, no rash, no rhinorrhea, no shortness of breath, no sore throat and no wheezing      Julia Mccoy is a 39 y.o. female  with a hx of hypertension, thyroid disease, obesity presents to the Emergency Department complaining of gradual, persistent, progressively worsening cough onset 2 weeks ago. Patient reports she initially had rhinorrhea, sore throat and otalgia however these have resolved. Her cough has persisted. She reports taking coricidin and Robitussin. She reports she saw her primary care physician last week who recommended she continue with her treatments. She reports anterior and lateral rib pain with coughing. She reports that she believes her cough has worsened in the last several days. She also has a persistent nasal congestion. Nothing seems to make her symptoms better or worse. She denies fever, chills, headache, neck pain, neck stiffness, shortness of breath, abdominal pain, nausea, vomiting, diarrhea, weakness, dizziness, syncope.  Past Medical History  Diagnosis Date  . Hypertension   . Thyroid disease   . Obesity    Past Surgical History  Procedure Laterality Date  . Keloid excision     No family history on file. Social History  Substance Use Topics  . Smoking status: Never Smoker   . Smokeless tobacco: None  . Alcohol Use: No   OB History    No data available     Review of Systems  Constitutional: Positive for fatigue. Negative for fever, chills and  appetite change.  HENT: Positive for congestion and postnasal drip. Negative for ear discharge, ear pain, mouth sores, rhinorrhea, sinus pressure and sore throat.   Eyes: Negative for visual disturbance.  Respiratory: Positive for cough and chest tightness. Negative for shortness of breath, wheezing and stridor.   Cardiovascular: Negative for chest pain, palpitations and leg swelling.  Gastrointestinal: Negative for nausea, vomiting, abdominal pain and diarrhea.  Genitourinary: Negative for dysuria, urgency, frequency and hematuria.  Musculoskeletal: Negative for myalgias, back pain, arthralgias and neck stiffness.  Skin: Negative for rash.  Neurological: Negative for syncope, light-headedness, numbness and headaches.  Hematological: Negative for adenopathy.  Psychiatric/Behavioral: The patient is not nervous/anxious.   All other systems reviewed and are negative.     Allergies  Review of patient's allergies indicates no known allergies.  Home Medications   Prior to Admission medications   Medication Sig Start Date End Date Taking? Authorizing Provider  amLODipine (NORVASC) 10 MG tablet Take 10 mg by mouth daily.    Historical Provider, MD  benzonatate (TESSALON) 100 MG capsule Take 2 capsules (200 mg total) by mouth 2 (two) times daily as needed for cough. 09/24/15   Malaia Buchta, PA-C  cyclobenzaprine (FLEXERIL) 5 MG tablet Take 1 tablet (5 mg total) by mouth 2 (two) times daily as needed. 08/26/15   Teressa Lower, NP  fluticasone (FLONASE) 50 MCG/ACT nasal spray Place 2 sprays into both nostrils daily. 09/24/15   Mychal Decarlo, PA-C  ibuprofen (ADVIL,MOTRIN) 800 MG tablet Take 1 tablet (800 mg total) by mouth 3 (three) times daily. 09/24/15  Dahlia ClientHannah Alicyn Klann, PA-C  levonorgestrel (MIRENA) 20 MCG/24HR IUD 1 each by Intrauterine route once. Inserted January 2011    Historical Provider, MD  levothyroxine (SYNTHROID, LEVOTHROID) 88 MCG tablet Take 88 mcg by mouth daily.     Historical Provider, MD  losartan-hydrochlorothiazide (HYZAAR) 50-12.5 MG per tablet Take 1 tablet by mouth daily.    Historical Provider, MD   BP 138/89 mmHg  Pulse 80  Temp(Src) 98 F (36.7 C) (Oral)  Wt 157.852 kg  SpO2 98% Physical Exam  Constitutional: She is oriented to person, place, and time. She appears well-developed and well-nourished. No distress.  HENT:  Head: Normocephalic and atraumatic.  Right Ear: Tympanic membrane, external ear and ear canal normal.  Left Ear: Tympanic membrane, external ear and ear canal normal.  Nose: Mucosal edema and rhinorrhea present. No epistaxis. Right sinus exhibits no maxillary sinus tenderness and no frontal sinus tenderness. Left sinus exhibits no maxillary sinus tenderness and no frontal sinus tenderness.  Mouth/Throat: Uvula is midline and mucous membranes are normal. Mucous membranes are not pale and not cyanotic. No oropharyngeal exudate, posterior oropharyngeal edema, posterior oropharyngeal erythema or tonsillar abscesses.  Eyes: Conjunctivae are normal. Pupils are equal, round, and reactive to light.  Neck: Normal range of motion and full passive range of motion without pain.  Cardiovascular: Normal rate and intact distal pulses.   Pulmonary/Chest: Effort normal. No accessory muscle usage or stridor. No tachypnea. No respiratory distress. She has decreased breath sounds. She exhibits tenderness.  Clear and equal breath sounds without focal wheezes, rhonchi, rales Diminished breath sounds throughout Mild tenderness to palpation along the anterior chest wall and bilateral ribs  Abdominal: Soft. Bowel sounds are normal. There is no tenderness.  Musculoskeletal: Normal range of motion.  Lymphadenopathy:    She has no cervical adenopathy.  Neurological: She is alert and oriented to person, place, and time.  Skin: Skin is warm and dry. No rash noted. She is not diaphoretic.  Psychiatric: She has a normal mood and affect.  Nursing note  and vitals reviewed.   ED Course  Procedures (including critical care time)  Imaging Review Dg Chest 2 View  09/24/2015  CLINICAL DATA:  Cough and congestion ; fever for 2 weeks EXAM: CHEST  2 VIEW COMPARISON:  None. FINDINGS: The lungs are clear. Heart size and pulmonary vascularity are normal. No adenopathy. No bone lesions. IMPRESSION: No edema or consolidation. Electronically Signed   By: Bretta BangWilliam  Woodruff III M.D.   On: 09/24/2015 20:28   I have personally reviewed and evaluated these images and lab results as part of my medical decision-making.    MDM   Final diagnoses:  Viral bronchitis  Cough   Julia Mccoy presents with contrast consistent with viral bronchitis.  Pt CXR negative for acute infiltrate. Patients symptoms are consistent with URI, likely viral etiology. Discussed that antibiotics are not indicated for viral infections. Pt will be discharged with symptomatic treatment.  Verbalizes understanding and is agreeable with plan. Pt is hemodynamically stable & in NAD prior to dc.  BP 138/89 mmHg  Pulse 80  Temp(Src) 98 F (36.7 C) (Oral)  Wt 157.852 kg  SpO2 98%    Dierdre ForthHannah Bilan Tedesco, PA-C 09/24/15 2037  Lyndal Pulleyaniel Knott, MD 09/25/15 [phone]

## 2015-09-24 NOTE — Discharge Instructions (Signed)
1. Medications: flonase, mucinex (not DM), tessalon, albuterol inhaler, usual home medications 2. Treatment: rest, drink plenty of fluids, take tylenol or ibuprofen for fever control 3. Follow Up: Please followup with your primary doctor in 3 days for discussion of your diagnoses and further evaluation after today's visit; if you do not have a primary care doctor use the resource guide provided to find one; Return to the ER for high fevers, difficulty breathing or other concerning symptoms    Acute Bronchitis Bronchitis is inflammation of the airways that extend from the windpipe into the lungs (bronchi). The inflammation often causes mucus to develop. This leads to a cough, which is the most common symptom of bronchitis.  In acute bronchitis, the condition usually develops suddenly and goes away over time, usually in a couple weeks. Smoking, allergies, and asthma can make bronchitis worse. Repeated episodes of bronchitis may cause further lung problems.  CAUSES Acute bronchitis is most often caused by the same virus that causes a cold. The virus can spread from person to person (contagious) through coughing, sneezing, and touching contaminated objects. SIGNS AND SYMPTOMS   Cough.   Fever.   Coughing up mucus.   Body aches.   Chest congestion.   Chills.   Shortness of breath.   Sore throat.  DIAGNOSIS  Acute bronchitis is usually diagnosed through a physical exam. Your health care provider will also ask you questions about your medical history. Tests, such as chest X-rays, are sometimes done to rule out other conditions.  TREATMENT  Acute bronchitis usually goes away in a couple weeks. Oftentimes, no medical treatment is necessary. Medicines are sometimes given for relief of fever or cough. Antibiotic medicines are usually not needed but may be prescribed in certain situations. In some cases, an inhaler may be recommended to help reduce shortness of breath and control the  cough. A cool mist vaporizer may also be used to help thin bronchial secretions and make it easier to clear the chest.  HOME CARE INSTRUCTIONS  Get plenty of rest.   Drink enough fluids to keep your urine clear or pale yellow (unless you have a medical condition that requires fluid restriction). Increasing fluids may help thin your respiratory secretions (sputum) and reduce chest congestion, and it will prevent dehydration.   Take medicines only as directed by your health care provider.  If you were prescribed an antibiotic medicine, finish it all even if you start to feel better.  Avoid smoking and secondhand smoke. Exposure to cigarette smoke or irritating chemicals will make bronchitis worse. If you are a smoker, consider using nicotine gum or skin patches to help control withdrawal symptoms. Quitting smoking will help your lungs heal faster.   Reduce the chances of another bout of acute bronchitis by washing your hands frequently, avoiding people with cold symptoms, and trying not to touch your hands to your mouth, nose, or eyes.   Keep all follow-up visits as directed by your health care provider.  SEEK MEDICAL CARE IF: Your symptoms do not improve after 1 week of treatment.  SEEK IMMEDIATE MEDICAL CARE IF:  You develop an increased fever or chills.   You have chest pain.   You have severe shortness of breath.  You have bloody sputum.   You develop dehydration.  You faint or repeatedly feel like you are going to pass out.  You develop repeated vomiting.  You develop a severe headache. MAKE SURE YOU:   Understand these instructions.  Will watch your condition.  Will get help right away if you are not doing well or get worse.   This information is not intended to replace advice given to you by your health care provider. Make sure you discuss any questions you have with your health care provider.   Document Released: 11/09/2004 Document Revised: 10/23/2014  Document Reviewed: 03/25/2013 Elsevier Interactive Patient Education 2016 [employer].    Emergency Department Resource Guide 1) Find a Doctor and Pay Out of Pocket Although you won't have to find out who is covered by your insurance plan, it is a good idea to ask around and get recommendations. You will then need to call the office and see if the doctor you have chosen will accept you as a new patient and what types of options they offer for patients who are self-pay. Some doctors offer discounts or will set up payment plans for their patients who do not have insurance, but you will need to ask so you aren't surprised when you get to your appointment.  2) Contact Your Local Health Department Not all health departments have doctors that can see patients for sick visits, but many do, so it is worth a call to see if yours does. If you don't know where your local health department is, you can check in your phone book. The CDC also has a tool to help you locate your state's health department, and many state websites also have listings of all of their local health departments.  3) Find a Walk-in Clinic If your illness is not likely to be very severe or complicated, you may want to try a walk in clinic. These are popping up all over the country in pharmacies, drugstores, and shopping centers. They're usually staffed by nurse practitioners or physician assistants that have been trained to treat common illnesses and complaints. They're usually fairly quick and inexpensive. However, if you have serious medical issues or chronic medical problems, these are probably not your best option.  No Primary Care Doctor: - Call Health Connect at  [phone] - they can help you locate a primary care doctor that  accepts your insurance, provides certain services, etc. - Physician Referral Service- [phone]  Chronic Pain Problems: Organization         Address  Phone   Notes  [facility]   [phone] Patients need to be referred by their primary care doctor.   Medication Assistance: Organization         Address  Phone   Notes  [facility] Medication [facility] [address] [place] [place], [place] [address] [phone] --Must be a resident of [facility] -- Must have NO insurance coverage whatsoever (no Medicaid/ Medicare, etc.) -- The pt. MUST have a primary care doctor that directs their care regularly and follows them in the community   [facility]ist  [phone]   [employer]  [phone]    Agencies that provide inexpensive medical care: Organization         Address  Phone   Notes  [facility]  [phone]   [facility]    [phone]   [facility] [address] [place], [place] [address] [phone]   [facility] [address] [place] [place] [phone]   Planned Parenthood    [phone]   [facility]    [phone]   [facility]  [address]  Lynne Logan Phone:  [phone], Fax:  [phone] Hours of Operation:  9 am - 6 pm, M-F.  Also accepts Medicaid/Medicare and self-pay.  [facility]  [address], [place] Phone: [phone], Fax: [phone]. Hours of Operation:  8:30 am - 5:30 pm, M-F.  Also accepts Medicaid and self-pay.  [facility] [place] [address], [place] Phone: [phone]   [facility] [address] Natasha Bence [place], [place] [phone], Ext. [phone] Mondays & Thursdays: 7-9 AM.  First 15 patients are seen on a first come, first serve basis.    Medicaid-accepting [facility] Providers:  Organization         Address  Phone   Notes  [facility] [address], [place] [phone] Also accepts self-pay patients.  [facility] [address] Laurell Josephs [place],  [place]  [phone]   [facility] [address], [place] [phone]   [facility] [address] [place] [phone]   Renaye Rakers [address], [place]   [phone] Only accepts [place] Access [place] patients after they have their name applied to their card.   Self-Pay (no insurance) in [facility]:  Organization         Address  Phone   Notes  Sickle Cell Patients, [facility] Internal Medicine [address] [place], [place] [phone]   [facility] [address] [place], [place] [phone]   [facility] [place]  [address] [place] [address], [place] [phone]   [facility]/Dr. Osei-Bonsu  [address] [place] or [address], [place] [phone] Phone number for both [place] and [place] locations is the same.  [facility]gent [facility] and [facility] [address], [place] [phone]   [facility] [address] [place] or [address] [phone]   [facility] [address] [place] [phone], phone; [phone], fax Sees patients 1st and 3rd Saturday of every month.  Must not qualify for public or private insurance (i.e. Medicaid, Medicare, [place] Health Choice, Veterans' Benefits)  Household income should be no more than 200% of the poverty level The clinic cannot treat you if you are pregnant or think you are pregnant  Sexually transmitted diseases are not treated at the clinic.    Dental Care: Organization         Address  Phone  Notes  [facility] Department of [facility] [address] [place], [place] [phone] Accepts children up to age 25 who are enrolled in [place] or [place] Health Choice; pregnant women with a Medicaid card; and children who have applied for Medicaid or [place] [facility]  Choice, but were declined, whose parents can pay a reduced fee at time of service.  [facility] Department of [facility]  [address], [place] [phone] Accepts children up to age 47 who are enrolled in [place] or [place] Health Choice; pregnant women with a Medicaid card; and children who have applied for Medicaid or [place] Health Choice, but were declined, whose parents can pay a reduced fee at time of service.  [facility]  [address] [place], [place] [phone] Patients are seen  by appointment only. Walk-ins are not accepted. [facility] will see patients 71 years of age and older. Monday - Tuesday (8am-5pm) Most Wednesdays (8:30-5pm) $30 per visit, cash only  [facility]  [address], [facility] [phone] Patients are seen by appointment only. Walk-ins are not accepted. [facility] will see patients 10 years of age and older. One Wednesday Evening (Monthly: Volunteer Based).  $30 per visit, cash only  [employer]  [phone] for adults; Children under age 45, call Graduate Pediatric Dentistry at [phone]. Children aged 32-14, please call [phone] to request a pediatric application.  Dental services are provided in all areas of dental care including fillings, crowns and bridges, complete and partial dentures, implants, gum treatment, root canals, and extractions. Preventive care is also provided. Treatment is provided to both adults and children. Patients are selected via a lottery and there is often a waiting list.   [facility] [address], [place]  [phone] [email or web address]   [facility] [address] [place], [place] [phone], Ext. [phone] Second and Fourth Thursday of each month, opens at 6:30 AM; Clinic ends at 9 AM.  Patients are seen on a first-come first-served basis, and a limited number are seen during each  clinic.   [employer]  [address] Ether Griffins [place], [place] [phone]   Eligibility Requirements You must have lived in [place], [place], or [place] counties for at least the last three months.   You cannot be eligible for state or federal sponsored [place], including [employer], [place], or [employer].   You generally cannot be eligible for healthcare insurance through your employer.    How to apply: Eligibility screenings are held every Tuesday and Wednesday afternoon from 1:00 pm until 4:00 pm. You do not need an appointment for the interview!  [facility] [address], [place], [place] [phone]   [facility] Health Department  [phone]   [facility] Health Department  [phone]   [facility] Health Department  [phone]    Behavioral Health Resources in the Community: Intensive Outpatient Programs Organization         Address  Phone  Notes  [facility] [address] [place], [place] [phone]   [facility] Outpatient [address], [place], [place] [phone]   [facility] [address] [place], [place]  [phone]   [facility] [address],  [place], [place] [phone] or [phone]   Substance Abuse Resources Organization         Address  Phone  Notes  [facility]  [phone]   [facility]  [phone]   [facility]  [phone]   Floydene Flock  [phone]   Residential & Outpatient Substance Abuse Program  [phone]   Psychological Services Organization         Address  Phone  Notes  [facility]  [phone]   [facility] Services  [phone]   [facility] Health [address] [place] [phone] or [phone]    Mobile Crisis Teams Organization         Address  Phone  Notes  [facility], Mobile  Crisis Care Unit  [phone]   [facility]  [address] [place], [place] [phone]   [facility] [address] [place]  [phone]    Self-Help/Support Groups Organization         Address  Phone             Notes  [facility] [place] - variety of support groups  336- I7437963 Call for more information  Narcotics Anonymous (NA), [facility] [address], [employer] [place]  2 meetings at this location   Statistician         Address  Phone  Notes  [facility] [address],    [place]  [phone]   [facility]  [address] [place] [address], [place], [place] [phone]   [facility] [address] [place], [place] [phone] Admissions: 8am-3pm M-F  [facility] [address],    [place], [place] [phone]   [facility] [address] [place], [place], [place] [phone]   [facility] [address]  [place], [place] [phone]   [facility] - Intensive Outpatient [address] Laurell Josephs [address], [place], [place] [phone]   [facility] ([facility] [address] [place].,  [place], [place] [phone] or [phone]   [facility] ([facility]) [address], [place], [place] [phone] Accepts Medicaid  [facility] [address]  [place] [phone] Substance Abuse/Addiction Treatment   [facility] Organization         Address  Phone  Notes  [facility]  [phone]   Angie Fava, PhD [address] Ervin Knack [place], [place]   [phone] or [phone]   [facility] Behavioral   [address] [place], [place] [phone]   [facility] [address], [place], [place] [phone] Insurance/Medicaid/sponsorship through [facility] [address]                                    [place], [place] [phone] Therapy/tele-psych/case  [facility] [address], [place] [phone]    Dr. Lolly Mustache  [phone]   [facility]  [employer] [facility] Dept. 1) [address], [place] 2) [address] [place] 3)  [address], [place] [phone]  [phone]   [facility] Child Abuse Hotline (916)194-9435 or [phone] (After Hours)

## 2015-09-24 NOTE — ED Notes (Signed)
Pt c/o cough, congestion cp w cough x 2 weeks  Worse past week

## 2015-09-24 NOTE — ED Notes (Signed)
Pt with URI sx x 2 weeks worsened in last week. Cough chest congestion.

## 2015-10-29 DIAGNOSIS — G4733 Obstructive sleep apnea (adult) (pediatric): Secondary | ICD-10-CM | POA: Insufficient documentation

## 2016-06-13 ENCOUNTER — Emergency Department (HOSPITAL_BASED_OUTPATIENT_CLINIC_OR_DEPARTMENT_OTHER)
Admission: EM | Admit: 2016-06-13 | Discharge: 2016-06-13 | Disposition: A | Discharge status: Home / Self Care | Payer: 59 | Attending: Emergency Medicine | Admitting: Emergency Medicine

## 2016-06-13 DIAGNOSIS — Y999 Unspecified external cause status: Secondary | ICD-10-CM | POA: Diagnosis not present

## 2016-06-13 DIAGNOSIS — I10 Essential (primary) hypertension: Secondary | ICD-10-CM | POA: Diagnosis not present

## 2016-06-13 DIAGNOSIS — Z79899 Other long term (current) drug therapy: Secondary | ICD-10-CM | POA: Insufficient documentation

## 2016-06-13 DIAGNOSIS — M549 Dorsalgia, unspecified: Secondary | ICD-10-CM | POA: Insufficient documentation

## 2016-06-13 DIAGNOSIS — Y9389 Activity, other specified: Secondary | ICD-10-CM | POA: Insufficient documentation

## 2016-06-13 DIAGNOSIS — Y92410 Unspecified street and highway as the place of occurrence of the external cause: Secondary | ICD-10-CM | POA: Diagnosis not present

## 2016-06-13 DIAGNOSIS — M542 Cervicalgia: Secondary | ICD-10-CM | POA: Diagnosis not present

## 2016-06-13 NOTE — Discharge Instructions (Signed)
Take ibuprofen as needed for pain. Use heat on your neck as needed for discomfort. Follow-up with your primary care provider in 3 days if your symptoms are worsening or not improving. Return to emergency department if you experience chest pain, shortness of breath, dizziness, worsening headache, visual changes, abdominal pain, nausea, vomiting, numbness/tingling or weakness or any other concerning symptoms.
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
## 2016-06-16 ENCOUNTER — Emergency Department (HOSPITAL_COMMUNITY)
Admission: EM | Admit: 2016-06-16 | Discharge: 2016-06-16 | Disposition: A | Discharge status: Home / Self Care | Payer: 59 | Attending: Emergency Medicine | Admitting: Emergency Medicine

## 2016-06-16 ENCOUNTER — Emergency Department (HOSPITAL_COMMUNITY): Payer: 59

## 2016-06-16 ENCOUNTER — Encounter (HOSPITAL_COMMUNITY): Payer: Self-pay | Admitting: Emergency Medicine

## 2016-06-16 DIAGNOSIS — E039 Hypothyroidism, unspecified: Secondary | ICD-10-CM | POA: Insufficient documentation

## 2016-06-16 DIAGNOSIS — I10 Essential (primary) hypertension: Secondary | ICD-10-CM | POA: Insufficient documentation

## 2016-06-16 DIAGNOSIS — R002 Palpitations: Secondary | ICD-10-CM | POA: Insufficient documentation

## 2016-06-16 DIAGNOSIS — R000 Tachycardia, unspecified: Secondary | ICD-10-CM | POA: Diagnosis present

## 2016-06-16 DIAGNOSIS — R5383 Other fatigue: Secondary | ICD-10-CM | POA: Diagnosis not present

## 2016-06-16 DIAGNOSIS — R5381 Other malaise: Secondary | ICD-10-CM

## 2016-06-16 DIAGNOSIS — Z79899 Other long term (current) drug therapy: Secondary | ICD-10-CM | POA: Insufficient documentation

## 2016-06-16 LAB — URINE MICROSCOPIC-ADD ON

## 2016-06-16 LAB — CBC WITH DIFFERENTIAL/PLATELET
Basophils Absolute: 0 10*3/uL (ref 0.0–0.1)
Basophils Relative: 0 %
EOS ABS: 0.1 10*3/uL (ref 0.0–0.7)
Eosinophils Relative: 1 %
HEMATOCRIT: 40.9 % (ref 36.0–46.0)
HEMOGLOBIN: 12.7 g/dL (ref 12.0–15.0)
LYMPHS ABS: 2.2 10*3/uL (ref 0.7–4.0)
Lymphocytes Relative: 42 %
MCH: 27.3 pg (ref 26.0–34.0)
MCHC: 31.1 g/dL (ref 30.0–36.0)
MCV: 88 fL (ref 78.0–100.0)
MONOS PCT: 8 %
Monocytes Absolute: 0.4 10*3/uL (ref 0.1–1.0)
NEUTROS PCT: 49 %
Neutro Abs: 2.6 10*3/uL (ref 1.7–7.7)
Platelets: 224 10*3/uL (ref 150–400)
RBC: 4.65 MIL/uL (ref 3.87–5.11)
RDW: 13.8 % (ref 11.5–15.5)
WBC: 5.3 10*3/uL (ref 4.0–10.5)

## 2016-06-16 LAB — URINALYSIS, ROUTINE W REFLEX MICROSCOPIC
BILIRUBIN URINE: NEGATIVE
GLUCOSE, UA: NEGATIVE mg/dL
Ketones, ur: NEGATIVE mg/dL
Nitrite: NEGATIVE
PH: 7 (ref 5.0–8.0)
Protein, ur: NEGATIVE mg/dL
SPECIFIC GRAVITY, URINE: 1.019 (ref 1.005–1.030)

## 2016-06-16 LAB — COMPREHENSIVE METABOLIC PANEL
ALK PHOS: 53 U/L (ref 38–126)
ALT: 19 U/L (ref 14–54)
ANION GAP: 7 (ref 5–15)
AST: 21 U/L (ref 15–41)
Albumin: 3.9 g/dL (ref 3.5–5.0)
BILIRUBIN TOTAL: 0.7 mg/dL (ref 0.3–1.2)
BUN: 10 mg/dL (ref 6–20)
CALCIUM: 9 mg/dL (ref 8.9–10.3)
CO2: 26 mmol/L (ref 22–32)
CREATININE: 0.75 mg/dL (ref 0.44–1.00)
Chloride: 105 mmol/L (ref 101–111)
GFR calc non Af Amer: >60 mL/min (ref >60)
Glucose, Bld: 104 mg/dL — ABNORMAL HIGH (ref 65–99)
Potassium: 3.6 mmol/L (ref 3.5–5.1)
Sodium: 138 mmol/L (ref 135–145)
TOTAL PROTEIN: 7.7 g/dL (ref 6.5–8.1)

## 2016-06-16 LAB — POC URINE PREG, ED: Preg Test, Ur: NEGATIVE

## 2016-06-16 LAB — I-STAT TROPONIN, ED: TROPONIN I, POC: 0 ng/mL (ref 0.00–0.08)
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
## 2016-06-16 NOTE — Discharge Instructions (Signed)
You were seen in the emergency room today for evaluation of feeling like your heart is racing and generally feeling unwell. Your workup was completely unremarkable. Please fill and take your levothyroxine when your insurance is reinstated. Please follow up with your primary care provider as soon as possible. Return to the ER for new or worsening symptoms.
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
## 2016-09-02 ENCOUNTER — Encounter (HOSPITAL_BASED_OUTPATIENT_CLINIC_OR_DEPARTMENT_OTHER): Payer: Self-pay | Admitting: Emergency Medicine

## 2016-09-02 ENCOUNTER — Emergency Department (HOSPITAL_BASED_OUTPATIENT_CLINIC_OR_DEPARTMENT_OTHER): Payer: 59

## 2016-09-02 ENCOUNTER — Emergency Department (HOSPITAL_BASED_OUTPATIENT_CLINIC_OR_DEPARTMENT_OTHER)
Admission: EM | Admit: 2016-09-02 | Discharge: 2016-09-03 | Disposition: A | Discharge status: Home / Self Care | Payer: 59 | Attending: Emergency Medicine | Admitting: Emergency Medicine

## 2016-09-02 DIAGNOSIS — I10 Essential (primary) hypertension: Secondary | ICD-10-CM | POA: Insufficient documentation

## 2016-09-02 DIAGNOSIS — R0789 Other chest pain: Secondary | ICD-10-CM | POA: Insufficient documentation

## 2016-09-02 DIAGNOSIS — Z79899 Other long term (current) drug therapy: Secondary | ICD-10-CM | POA: Diagnosis not present

## 2016-09-02 LAB — CBC
HCT: 39.8 % (ref 36.0–46.0)
HEMOGLOBIN: 12.7 g/dL (ref 12.0–15.0)
MCH: 27.9 pg (ref 26.0–34.0)
MCHC: 31.9 g/dL (ref 30.0–36.0)
MCV: 87.5 fL (ref 78.0–100.0)
PLATELETS: 216 10*3/uL (ref 150–400)
RBC: 4.55 MIL/uL (ref 3.87–5.11)
RDW: 14.1 % (ref 11.5–15.5)
WBC: 6.2 10*3/uL (ref 4.0–10.5)

## 2016-09-02 LAB — COMPREHENSIVE METABOLIC PANEL
ALBUMIN: 3.9 g/dL (ref 3.5–5.0)
ALK PHOS: 49 U/L (ref 38–126)
ALT: 20 U/L (ref 14–54)
AST: 24 U/L (ref 15–41)
Anion gap: 8 (ref 5–15)
BUN: 11 mg/dL (ref 6–20)
CALCIUM: 9.1 mg/dL (ref 8.9–10.3)
CHLORIDE: 105 mmol/L (ref 101–111)
CO2: 25 mmol/L (ref 22–32)
CREATININE: 0.72 mg/dL (ref 0.44–1.00)
GFR calc Af Amer: >60 mL/min (ref >60)
GFR calc non Af Amer: >60 mL/min (ref >60)
GLUCOSE: 114 mg/dL — AB (ref 65–99)
Potassium: 3.4 mmol/L — ABNORMAL LOW (ref 3.5–5.1)
SODIUM: 138 mmol/L (ref 135–145)
Total Bilirubin: 0.3 mg/dL (ref 0.3–1.2)
Total Protein: 7.8 g/dL (ref 6.5–8.1)

## 2016-09-02 NOTE — ED Notes (Signed)
Pt (denies: pain, sob, nausea or dizziness), alert, NAD, calm, interactive, resps e/u, no dyspnea noted, VSS/improved.

## 2016-09-02 NOTE — ED Notes (Signed)
IV attempts x 2 without success. Able to draw blood from site, however.

## 2016-09-02 NOTE — ED Notes (Signed)
EDP at BS 

## 2016-09-02 NOTE — ED Notes (Signed)
Back from xray, alert, NAD, calm, no dyspnea noted.

## 2016-09-02 NOTE — ED Triage Notes (Signed)
Pt reports chest "discomfort" and left arm pain since waking from nap this afternoon.

## 2016-09-02 NOTE — ED Provider Notes (Signed)
[facility] Note: Lowella DellJ. Lane Admire Bunnell, MD, FACEP  CSN: [id number] MRN: [id number] ARRIVAL: 09/02/16 at 2204 ROOM: [facility]  By signing my name below, I, Modena JanskyAlbert Thayil, attest that this documentation has been prepared under the direction and in the presence of Paula LibraJohn Clemmie Buelna, MD . Electronically Signed: Modena JanskyAlbert Thayil, Scribe. 09/02/2016. 11:25 PM.  CHIEF COMPLAINT  Chest Pain   HISTORY OF PRESENT ILLNESS  Tilden FossaQuandra Garron is a 40 y.o. female with a PMHx of HTN complaining of central chest discomfort that started about 4 hours ago. She has had similar discomfort in the past due to high blood pressure. She currently rates the pain as a 7/10. She reports no modifying factors for the pain. She denies any SOB, nausea, vomiting or diaphoresis.    Past Medical History:  Diagnosis Date  . Hypertension   . Obesity   . Thyroid disease     Past Surgical History:  Procedure Laterality Date  . KELOID EXCISION      No family history on file.  Social History  Substance Use Topics  . Smoking status: Never Smoker  . Smokeless tobacco: Never Used  . Alcohol use No    Prior to Admission medications   Medication Sig Start Date End Date Taking? Authorizing Provider  amLODipine (NORVASC) 10 MG tablet Take 10 mg by mouth daily.   Yes Historical Provider, MD  levothyroxine (SYNTHROID, LEVOTHROID) 88 MCG tablet Take 88 mcg by mouth daily.   Yes Historical Provider, MD  losartan-hydrochlorothiazide (HYZAAR) 50-12.5 MG per tablet Take 1 tablet by mouth daily.   Yes Historical Provider, MD  Multiple Vitamins-Minerals (MULTIVITAMIN WITH MINERALS) tablet Take 1 tablet by mouth daily.   Yes Historical Provider, MD  benzonatate (TESSALON) 100 MG capsule Take 2 capsules (200 mg total) by mouth 2 (two) times daily as needed for cough. Patient not taking: Reported on 06/16/2016 09/24/15   Dahlia ClientHannah Muthersbaugh, PA-C  cyclobenzaprine (FLEXERIL) 5 MG tablet Take 1 tablet (5 mg total) by mouth 2  (two) times daily as needed. Patient not taking: Reported on 06/16/2016 08/26/15   Teressa LowerVrinda Pickering, NP  fluticasone (FLONASE) 50 MCG/ACT nasal spray Place 2 sprays into both nostrils daily. Patient not taking: Reported on 06/16/2016 09/24/15   Dahlia ClientHannah Muthersbaugh, PA-C  ibuprofen (ADVIL,MOTRIN) 800 MG tablet Take 1 tablet (800 mg total) by mouth 3 (three) times daily. Patient not taking: Reported on 06/16/2016 09/24/15   Dahlia ClientHannah Muthersbaugh, PA-C    Allergies Patient has no known allergies.   REVIEW OF SYSTEMS  Negative except as noted here or in the History of Present Illness.   PHYSICAL EXAMINATION  Initial Vital Signs Blood pressure (!) 154/107, pulse 85, temperature 97.4 F (36.3 C), temperature source Oral, resp. rate 21, height 5\' 11"  (1.803 m), weight (!) 340 lb (154.2 kg), last menstrual period 08/21/2016, SpO2 99 %.  Examination General: Well-developed, obese female in no acute distress; appearance consistent with age of record HENT: normocephalic; atraumatic Eyes: pupils equal, round and reactive to light; extraocular muscles intact Neck: supple Heart: regular rate and rhythm; no murmur Lungs: clear to auscultation bilaterally Abdomen: soft; obese; nontender; bowel sounds present Extremities: No deformity; full range of motion; pulses normal Neurologic: Awake, alert and oriented; motor function intact in all extremities and symmetric; no facial droop Skin: Warm and dry, keloids to the anterior neck Psychiatric: Normal mood and affect   RESULTS  Summary of this visit's results, reviewed by myself:   EKG Interpretation  Date/Time:  Saturday September 02 2016  22:29:40 EST Ventricular Rate:  85 PR Interval:    QRS Duration: 95 QT Interval:  380 QTC Calculation: 452 R Axis:   82 Text Interpretation:  Sinus rhythm ST elev, probable normal early repol pattern Baseline wander in lead(s) II III aVF No significant change was found Confirmed by Aeneas Longsworth  MD, Jonny Ruiz ([id number]) on  09/02/2016 10:45:09 PM      Laboratory Studies: Results for orders placed or performed during the hospital encounter of 09/02/16 (from the past 24 hour(s))  CBC     Status: None   Collection Time: 09/02/16 10:43 PM  Result Value Ref Range   WBC 6.2 4.0 - 10.5 K/uL   RBC 4.55 3.87 - 5.11 MIL/uL   Hemoglobin 12.7 12.0 - 15.0 g/dL   HCT 60.4 54.0 - 98.1 %   MCV 87.5 78.0 - 100.0 fL   MCH 27.9 26.0 - 34.0 pg   MCHC 31.9 30.0 - 36.0 g/dL   RDW 19.1 47.8 - 29.5 %   Platelets 216 150 - 400 K/uL  Comprehensive metabolic panel     Status: Abnormal   Collection Time: 09/02/16 10:43 PM  Result Value Ref Range   Sodium 138 135 - 145 mmol/L   Potassium 3.4 (L) 3.5 - 5.1 mmol/L   Chloride 105 101 - 111 mmol/L   CO2 25 22 - 32 mmol/L   Glucose, Bld 114 (H) 65 - 99 mg/dL   BUN 11 6 - 20 mg/dL   Creatinine, Ser 6.21 0.44 - 1.00 mg/dL   Calcium 9.1 8.9 - 30.8 mg/dL   Total Protein 7.8 6.5 - 8.1 g/dL   Albumin 3.9 3.5 - 5.0 g/dL   AST 24 15 - 41 U/L   ALT 20 14 - 54 U/L   Alkaline Phosphatase 49 38 - 126 U/L   Total Bilirubin 0.3 0.3 - 1.2 mg/dL   GFR calc non Af Amer >60 >60 mL/min   GFR calc Af Amer >60 >60 mL/min   Anion gap 8 5 - 15  Troponin I     Status: None   Collection Time: 09/02/16 10:43 PM  Result Value Ref Range   Troponin I <0.03 <0.03 ng/mL  Troponin I     Status: None   Collection Time: 09/03/16 12:59 AM  Result Value Ref Range   Troponin I <0.03 <0.03 ng/mL   Imaging Studies: Dg Chest 2 View  Result Date: 09/02/2016 CLINICAL DATA:  Acute onset of generalized chest pain and nausea. Initial encounter. EXAM: CHEST  2 VIEW COMPARISON:  Chest radiograph performed 06/16/2016 FINDINGS: The lungs are well-aerated. Mild vascular congestion is noted. Mild bilateral atelectasis is seen. There is no evidence of pleural effusion or pneumothorax. The heart is borderline enlarged. No acute osseous abnormalities are seen. IMPRESSION: Mild vascular congestion and borderline  cardiomegaly. Mild bilateral atelectasis seen. Electronically Signed   By: Roanna Raider M.D.   On: 09/02/2016 23:13    ED COURSE  Nursing notes and initial vitals signs, including pulse oximetry, reviewed.  Vitals:   09/02/16 2232 09/02/16 2330 09/03/16 0000 09/03/16 0030  BP: (!) 154/107 136/86 137/85 136/87  Pulse: 85 78 80 79  Resp: 21 16 19  (!) 27  Temp:      TempSrc:      SpO2: 99% 100% 100% 99%  Weight:      Height:       1:43 AM Patient's symptoms completely relieved after GI cocktail. She has had 2 negative troponins and an unchanged EKG. We will  treat for acid reflux.  PROCEDURES    ED DIAGNOSES     ICD-9-CM ICD-10-CM   1. Atypical chest pain 786.59 R07.89     I personally performed the services described in this documentation, which was scribed in my presence. The recorded information has been reviewed and is accurate.     Paula LibraJohn Ariann Khaimov, MD 09/03/16 [phone]

## 2016-09-02 NOTE — ED Notes (Signed)
Pt reports at 8 pm this evening Chest Pain started while she was dancing around.  Pt felt dizzy when Chest Pain started pt denies dizziness now.  Pt denies nausea and vomiting.  Pt denies diarrhea.  Pt denies numbness and tingling.  Pt did not take anything for chest pain at home.  Pt NAD.

## 2016-09-03 LAB — TROPONIN I: Troponin I: <0.03 ng/mL (ref <0.03)

## 2016-09-03 MED ORDER — OMEPRAZOLE 20 MG PO CPDR: 20.0000 mg | DELAYED_RELEASE_CAPSULE | Freq: Every day | ORAL | 0 refills | Status: DC

## 2016-09-03 MED ORDER — GI COCKTAIL ~~LOC~~
30.0000 mL | Freq: Once | ORAL | Status: AC
  Administered 2016-09-03: 30 mL via ORAL
  Filled 2016-09-03: qty 30

## 2016-09-03 MED ORDER — PANTOPRAZOLE SODIUM 40 MG PO TBEC
40.0000 mg | DELAYED_RELEASE_TABLET | Freq: Once | ORAL | Status: AC
  Administered 2016-09-03: 40 mg via ORAL
  Filled 2016-09-03: qty 1

## 2016-10-26 DIAGNOSIS — E782 Mixed hyperlipidemia: Secondary | ICD-10-CM | POA: Insufficient documentation

## 2016-10-26 DIAGNOSIS — R7303 Prediabetes: Secondary | ICD-10-CM | POA: Insufficient documentation

## 2016-11-08 DIAGNOSIS — F411 Generalized anxiety disorder: Secondary | ICD-10-CM | POA: Insufficient documentation

## 2016-12-10 ENCOUNTER — Emergency Department (HOSPITAL_BASED_OUTPATIENT_CLINIC_OR_DEPARTMENT_OTHER): Payer: BLUE CROSS/BLUE SHIELD

## 2016-12-10 ENCOUNTER — Emergency Department (HOSPITAL_BASED_OUTPATIENT_CLINIC_OR_DEPARTMENT_OTHER)
Admission: EM | Admit: 2016-12-10 | Discharge: 2016-12-10 | Disposition: A | Discharge status: Home / Self Care | Payer: BLUE CROSS/BLUE SHIELD | Attending: Emergency Medicine | Admitting: Emergency Medicine

## 2016-12-10 ENCOUNTER — Encounter (HOSPITAL_BASED_OUTPATIENT_CLINIC_OR_DEPARTMENT_OTHER): Payer: Self-pay | Admitting: Emergency Medicine

## 2016-12-10 DIAGNOSIS — R000 Tachycardia, unspecified: Secondary | ICD-10-CM | POA: Diagnosis not present

## 2016-12-10 DIAGNOSIS — E039 Hypothyroidism, unspecified: Secondary | ICD-10-CM | POA: Diagnosis not present

## 2016-12-10 DIAGNOSIS — I10 Essential (primary) hypertension: Secondary | ICD-10-CM | POA: Insufficient documentation

## 2016-12-10 DIAGNOSIS — Z79899 Other long term (current) drug therapy: Secondary | ICD-10-CM | POA: Diagnosis not present

## 2016-12-10 DIAGNOSIS — R002 Palpitations: Secondary | ICD-10-CM | POA: Insufficient documentation

## 2016-12-10 LAB — CBC WITH DIFFERENTIAL/PLATELET
BASOS ABS: 0 10*3/uL (ref 0.0–0.1)
Basophils Relative: 0 %
Eosinophils Absolute: 0.1 10*3/uL (ref 0.0–0.7)
Eosinophils Relative: 1 %
HEMATOCRIT: 38.9 % (ref 36.0–46.0)
HEMOGLOBIN: 12.7 g/dL (ref 12.0–15.0)
LYMPHS PCT: 33 %
Lymphs Abs: 2.4 10*3/uL (ref 0.7–4.0)
MCH: 28.3 pg (ref 26.0–34.0)
MCHC: 32.6 g/dL (ref 30.0–36.0)
MCV: 86.8 fL (ref 78.0–100.0)
MONO ABS: 0.6 10*3/uL (ref 0.1–1.0)
Monocytes Relative: 8 %
NEUTROS ABS: 4.2 10*3/uL (ref 1.7–7.7)
Neutrophils Relative %: 58 %
Platelets: 230 10*3/uL (ref 150–400)
RBC: 4.48 MIL/uL (ref 3.87–5.11)
RDW: 13.8 % (ref 11.5–15.5)
WBC: 7.2 10*3/uL (ref 4.0–10.5)

## 2016-12-10 LAB — BASIC METABOLIC PANEL
Anion gap: 6 (ref 5–15)
BUN: 9 mg/dL (ref 6–20)
CHLORIDE: 103 mmol/L (ref 101–111)
CO2: 28 mmol/L (ref 22–32)
CREATININE: 0.75 mg/dL (ref 0.44–1.00)
Calcium: 9 mg/dL (ref 8.9–10.3)
GFR calc non Af Amer: >60 mL/min (ref >60)
Glucose, Bld: 132 mg/dL — ABNORMAL HIGH (ref 65–99)
Potassium: 3.6 mmol/L (ref 3.5–5.1)
Sodium: 137 mmol/L (ref 135–145)

## 2016-12-10 LAB — TROPONIN I: Troponin I: <0.03 ng/mL (ref <0.03)

## 2016-12-10 LAB — D-DIMER, QUANTITATIVE (NOT AT ARMC)

## 2016-12-10 MED ORDER — METOPROLOL TARTRATE 25 MG PO TABS: 12.5000 mg | ORAL_TABLET | Freq: Two times a day (BID) | ORAL | 0 refills | Status: DC

## 2016-12-10 NOTE — ED Provider Notes (Signed)
[facility] Provider Note   CSN: [id number] Arrival date & time: 12/10/16  [id number]     History   Chief Complaint Chief Complaint  Patient presents with  . Palpitations    HPI Julia Mccoy is a 41 y.o. female.  HPI Patient has had racing heart since last night. It started while watching a movie. She denies chest pain or dyspnea. This has occurred intermittently over the past year. No syncope or lightheadedness. Patient has not had further evaluation for tachycardia\palpitation. Denies caffeine consumption, denies tobacco use, denies OTC products. She reports she did follow up with her primary care doctor regarding these symptoms. She reports she was prescribed Prozac but she has not started it yet. Past Medical History:  Diagnosis Date  . Hypertension   . Obesity   . Thyroid disease     There are no active problems to display for this patient.   Past Surgical History:  Procedure Laterality Date  . KELOID EXCISION      OB History    No data available       Home Medications    Prior to Admission medications   Medication Sig Start Date End Date Taking? Authorizing Provider  amLODipine (NORVASC) 10 MG tablet Take 10 mg by mouth daily.    Historical Provider, MD  levothyroxine (SYNTHROID, LEVOTHROID) 88 MCG tablet Take 88 mcg by mouth daily.    Historical Provider, MD  losartan-hydrochlorothiazide (HYZAAR) 50-12.5 MG per tablet Take 1 tablet by mouth daily.    Historical Provider, MD  metoprolol tartrate (LOPRESSOR) 25 MG tablet Take 0.5 tablets (12.5 mg total) by mouth 2 (two) times daily. 12/10/16   Arby BarretteMarcy Smiley Birr, MD  Multiple Vitamins-Minerals (MULTIVITAMIN WITH MINERALS) tablet Take 1 tablet by mouth daily.    Historical Provider, MD  omeprazole (PRILOSEC) 20 MG capsule Take 1 capsule (20 mg total) by mouth daily. 09/03/16   Paula LibraJohn Molpus, MD    Family History No family history on file.  Social History Social History  Substance Use Topics  . Smoking  status: Never Smoker  . Smokeless tobacco: Never Used  . Alcohol use No     Allergies   Patient has no known allergies.   Review of Systems Review of Systems 10 Systems reviewed and are negative for acute change except as noted in the HPI.   Physical Exam Updated Vital Signs BP 132/87   Pulse 100   Temp 98.4 F (36.9 C) (Oral)   Resp 20   Ht 5\' 11"  (1.803 m)   Wt (!) 333 lb (151 kg)   LMP 11/22/2016 (Exact Date)   SpO2 100%   BMI 46.44 kg/m   Physical Exam  Constitutional: She is oriented to person, place, and time. No distress.  Alert and nontoxic. No respiratory distress. Obesity.  HENT:  Head: Normocephalic and atraumatic.  Eyes: Conjunctivae and EOM are normal.  Neck: Neck supple.  Large keloid scars on neck. No thyromegaly.  Cardiovascular: Regular rhythm.   No murmur heard. Tachycardia. No rub murmur gallop. Monitor shows sinus rhythm of 105.  Pulmonary/Chest: Effort normal and breath sounds normal. No respiratory distress.  Abdominal: Soft. There is no tenderness.  Musculoskeletal: She exhibits no edema or tenderness.  Neurological: She is alert and oriented to person, place, and time. No cranial nerve deficit. She exhibits normal muscle tone. Coordination normal.  Skin: Skin is warm and dry.  Psychiatric: She has a normal mood and affect.  Nursing note and vitals reviewed.    ED  Treatments / Results  Labs (all labs ordered are listed, but only abnormal results are displayed) Labs Reviewed  BASIC METABOLIC PANEL - Abnormal; Notable for the following:       Result Value   Glucose, Bld 132 (*)    All other components within normal limits  TROPONIN I  D-DIMER, QUANTITATIVE (NOT AT [facility])  CBC WITH DIFFERENTIAL/PLATELET  URINALYSIS, ROUTINE W REFLEX MICROSCOPIC  PREGNANCY, URINE  RAPID URINE DRUG SCREEN, HOSP PERFORMED    EKG  EKG Interpretation None       Radiology Dg Chest 2 View  Result Date: 12/10/2016 CLINICAL DATA:  Palpitations  beginning yesterday.  Hypertension. EXAM: CHEST  2 VIEW COMPARISON:  09/02/2016 FINDINGS: The heart size and mediastinal contours are within normal limits. Both lungs are clear. No evidence of pneumothorax or pleural effusion. Mild thoracic spine degenerative changes again noted. IMPRESSION: No active cardiopulmonary disease. Electronically Signed   By: Myles Rosenthal M.D.   On: 12/10/2016 08:10    Procedures Procedures (including critical care time)  Medications Ordered in ED Medications - No data to display   Initial Impression / Assessment and Plan / ED Course  I have reviewed the triage vital signs and the nursing notes.  Pertinent labs & imaging results that were available during my care of the patient were reviewed by me and considered in my medical decision making (see chart for details).      Final Clinical Impressions(s) / ED Diagnoses   Final diagnoses:  Palpitations  Tachycardia  Patient is alert and clinically well in appearance. She has mild sinus tachycardia. This has been paroxysmal over about the past year. At this time, diagnostic evaluation is within normal limits. Patient does not have any electrolyte disturbance or anemia. Troponin is normal and d-dimer is negative. At this time, plan will be for follow-up with cardiology. I will empirically start the patient on a low dose of metoprolol. She is counseled on signs and symptoms for which return.  New Prescriptions New Prescriptions   METOPROLOL TARTRATE (LOPRESSOR) 25 MG TABLET    Take 0.5 tablets (12.5 mg total) by mouth 2 (two) times daily.     Arby Barrette, MD 12/10/16 [phone]

## 2016-12-10 NOTE — ED Triage Notes (Signed)
Pt reports "palpitations, feeling like my heart is racing" intermittently since around 2300 last night. Stated at the beginning she felt short of breath, but then just feeling like its racing. Denies chest pain, lightheadedness, nausea, vomiting or other symptoms.

## 2016-12-10 NOTE — ED Notes (Signed)
Pt in waiting room when dayshift staff arrived. Rn, Arline Asperi Masten brought pt back to room at this time and this EMT began vitals and EKG.

## 2016-12-13 ENCOUNTER — Emergency Department (HOSPITAL_BASED_OUTPATIENT_CLINIC_OR_DEPARTMENT_OTHER)
Admission: EM | Admit: 2016-12-13 | Discharge: 2016-12-13 | Disposition: A | Discharge status: Home / Self Care | Payer: BLUE CROSS/BLUE SHIELD | Attending: Emergency Medicine | Admitting: Emergency Medicine

## 2016-12-13 ENCOUNTER — Emergency Department (HOSPITAL_BASED_OUTPATIENT_CLINIC_OR_DEPARTMENT_OTHER): Payer: BLUE CROSS/BLUE SHIELD

## 2016-12-13 ENCOUNTER — Encounter (HOSPITAL_BASED_OUTPATIENT_CLINIC_OR_DEPARTMENT_OTHER): Payer: Self-pay | Admitting: *Deleted

## 2016-12-13 DIAGNOSIS — E039 Hypothyroidism, unspecified: Secondary | ICD-10-CM | POA: Insufficient documentation

## 2016-12-13 DIAGNOSIS — R0789 Other chest pain: Secondary | ICD-10-CM | POA: Insufficient documentation

## 2016-12-13 DIAGNOSIS — Z79899 Other long term (current) drug therapy: Secondary | ICD-10-CM | POA: Diagnosis not present

## 2016-12-13 DIAGNOSIS — I10 Essential (primary) hypertension: Secondary | ICD-10-CM | POA: Insufficient documentation

## 2016-12-13 DIAGNOSIS — R002 Palpitations: Secondary | ICD-10-CM | POA: Insufficient documentation

## 2016-12-13 DIAGNOSIS — R079 Chest pain, unspecified: Secondary | ICD-10-CM

## 2016-12-13 HISTORY — DX: Hypothyroidism, unspecified: E03.9

## 2016-12-13 LAB — TROPONIN I: Troponin I: <0.03 ng/mL (ref <0.03)

## 2016-12-13 NOTE — Discharge Instructions (Signed)
Follow-up with your cardiologist tomorrow as scheduled.  Continue your Lopressor as previously recommended.

## 2016-12-13 NOTE — ED Triage Notes (Signed)
Pt reports chest pressure and palpitations since 0630 this morning. Was seen here for similar symptoms 3 days ago. States she had had a cough for a couple of weeks

## 2016-12-13 NOTE — ED Notes (Signed)
Pt verbalizes understanding to f/u with cardiology as scheduled

## 2016-12-13 NOTE — ED Provider Notes (Signed)
[facility]   CSN: [id number] Arrival date & time: 12/13/16  [id number]     History   Chief Complaint Chief Complaint  Patient presents with  . Chest Pain    HPI Julia Mccoy is a 41 y.o. female.  Patient is a 41 year old female with history of hypertension and obesity. She presents for evaluation of palpitations and chest tightness. This started early this morning when she was getting her children ready for school. She reports feeling mildly short of breath and pressure in her jaw. This lasted for several minutes, then seemed to improve. She still feels as though her heart is racing.  She was seen here 3 days ago with similar complaints. She underwent a cardiac workup, however nothing emergent was found. Her d-dimer was negative and EKG and troponin were unremarkable. She was scheduled for a cardiology appointment which she has made for tomorrow.   The history is provided by the patient.  Chest Pain   This is a new problem. The current episode started 1 to 2 hours ago. The problem occurs constantly. The problem has been gradually improving. The pain is present in the substernal region. The pain is moderate. The quality of the pain is described as pressure-like. The pain does not radiate. She has tried nothing for the symptoms. The treatment provided no relief.    Past Medical History:  Diagnosis Date  . Hypertension   . Hypothyroidism   . Obesity   . Thyroid disease     There are no active problems to display for this patient.   Past Surgical History:  Procedure Laterality Date  . KELOID EXCISION      OB History    No data available       Home Medications    Prior to Admission medications   Medication Sig Start Date End Date Taking? Authorizing Provider  amLODipine (NORVASC) 10 MG tablet Take 10 mg by mouth daily.   Yes Historical Provider, MD  levothyroxine (SYNTHROID, LEVOTHROID) 88 MCG tablet Take 88 mcg by mouth daily.   Yes Historical  Provider, MD  losartan-hydrochlorothiazide (HYZAAR) 50-12.5 MG per tablet Take 1 tablet by mouth daily.   Yes Historical Provider, MD  Multiple Vitamins-Minerals (MULTIVITAMIN WITH MINERALS) tablet Take 1 tablet by mouth daily.   Yes Historical Provider, MD  metoprolol tartrate (LOPRESSOR) 25 MG tablet Take 0.5 tablets (12.5 mg total) by mouth 2 (two) times daily. 12/10/16   Arby Barrette, MD  omeprazole (PRILOSEC) 20 MG capsule Take 1 capsule (20 mg total) by mouth daily. 09/03/16   Paula Libra, MD    Family History No family history on file.  Social History Social History  Substance Use Topics  . Smoking status: Never Smoker  . Smokeless tobacco: Never Used  . Alcohol use No     Allergies   Patient has no known allergies.   Review of Systems Review of Systems  Cardiovascular: Positive for chest pain.  All other systems reviewed and are negative.    Physical Exam Updated Vital Signs BP 126/81 (BP Location: Right Arm)   Pulse 108   Temp 97.8 F (36.6 C) (Oral)   Resp 18   Ht 5\' 11"  (1.803 m)   Wt (!) 333 lb (151 kg)   LMP 11/22/2016 (Exact Date)   SpO2 100%   BMI 46.44 kg/m   Physical Exam  Constitutional: She is oriented to person, place, and time. She appears well-developed and well-nourished. No distress.  HENT:  Head: Normocephalic  and atraumatic.  Neck: Normal range of motion. Neck supple.  Cardiovascular: Normal rate and regular rhythm.  Exam reveals no gallop and no friction rub.   No murmur heard. Pulmonary/Chest: Effort normal and breath sounds normal. No respiratory distress. She has no wheezes.  Abdominal: Soft. Bowel sounds are normal. She exhibits no distension. There is no tenderness.  Musculoskeletal: Normal range of motion. She exhibits no edema.  There is no calf tenderness or swelling.  Neurological: She is alert and oriented to person, place, and time.  Skin: Skin is warm and dry. She is not diaphoretic.  Nursing note and vitals  reviewed.    ED Treatments / Results  Labs (all labs ordered are listed, but only abnormal results are displayed) Labs Reviewed  TROPONIN I    EKG  EKG Interpretation  Date/Time:  Wednesday December 13 2016 07:35:55 EST Ventricular Rate:  97 PR Interval:    QRS Duration: 82 QT Interval:  366 QTC Calculation: 465 R Axis:   82 Text Interpretation:  Sinus rhythm Probable left atrial enlargement Baseline wander in lead(s) I II III aVR aVF Unchanged from 12/10/2016 Confirmed by Jesten Cappuccio  MD, Anyssa Sharpless ([id number]) on 12/13/2016 7:48:31 AM       Radiology No results found.  Procedures Procedures (including critical care time)  Medications Ordered in ED Medications - No data to display   Initial Impression / Assessment and Plan / ED Course  I have reviewed the triage vital signs and the nursing notes.  Pertinent labs & imaging results that were available during my care of the patient were reviewed by me and considered in my medical decision making (see chart for details).  Troponin is negative. EKG is unchanged. She has a follow-up appointment with cardiology tomorrow to discuss the same symptoms. I see no indication for hospitalization or emergent intervention. She is to follow-up tomorrow as scheduled.  Final Clinical Impressions(s) / ED Diagnoses   Final diagnoses:  None    New Prescriptions New Prescriptions   No medications on file     Geoffery Lyonsouglas Tomasa Dobransky, MD 12/13/16 [phone]

## 2016-12-14 ENCOUNTER — Encounter: Payer: Self-pay | Admitting: Physician Assistant

## 2016-12-14 ENCOUNTER — Ambulatory Visit (INDEPENDENT_AMBULATORY_CARE_PROVIDER_SITE_OTHER): Payer: BLUE CROSS/BLUE SHIELD | Admitting: Physician Assistant

## 2016-12-14 VITALS — BP 125/84 | HR 66 | Ht 71.0 in | Wt 330.0 lb

## 2016-12-14 DIAGNOSIS — R072 Precordial pain: Secondary | ICD-10-CM

## 2016-12-14 DIAGNOSIS — R002 Palpitations: Secondary | ICD-10-CM

## 2016-12-14 DIAGNOSIS — E039 Hypothyroidism, unspecified: Secondary | ICD-10-CM | POA: Diagnosis not present

## 2016-12-14 NOTE — Progress Notes (Signed)
Cardiology Office Note   Date:  12/14/2016   ID:  Julia Mccoy, DOB 06-05-1976, MRN [id number]  PCP:  Heron NayYOUNG, LAUREN E, PA  Cardiologist:  New, will be Dr. Lauraine RinneHarding  Julia Herdt, PA-C   Chief Complaint  Patient presents with  . New Evaluation    pt had palpitations, went to er was told she had mild tachy, yesterday morning went back to ED with chest pains that she woke up with    History of Present Illness: Julia Mccoy is a 41 y.o. female with a history of hypertension and obesity, thyroid disease  ER visit 12/10/2016 for palpitations, no chest pain at that time. Patient previously evaluated by PCP for this and prescribed Prozac. Appointment made ER visit 12/13/2016 for palpitations and chest tightness, nothing acute on labs or ECG, patient encouraged to keep appointment  Julia Mccoy presents for cardiology evaluation post-hospital  Pt w/ hx of about 10 episodes over the past 2 years of sudden onset of very high BP (same as before she started on rx) and palpitations. The symptoms would pass in about 10-15 minutes. She would go get fresh air but did not take any meds or get any evaluation during them. Her PCP recommended Prozac, not started.  02/24, pt was in a movie and her heart started racing, she did not have any other symptoms at that time. She does not know how fast her heart was, but it was regular. She agrees with a heart rate of about 130. She went to bed but could not sleep because of her heart. She was able to doze a little. She went to the ER the following morning. She was having some mild sinus tachycardia, no arrhythmia seen. She was prescribed the metoprolol, but never started it.   02/28 am, she had symptoms again. This time she had palpitations as well as chest discomfort, tightness and jaw discomfort. It got to a 6/10. She had never had the CP/jaw pain before. She was SOB, felt hot. She had an episode of diarrhea, no vomiting. She was coughing, gagging a little  with the cough.   She went to the ER, she was still having CP, but the palpitations had improved (not resolved). She did not get any rx, her ECG was SR in the 90s. She was discharged home, keep cards appt.   She got a cold about 2 weeks ago, with a cough, no fevers. She has been taking generic Robitussin and Coricidin HBP for the symptoms.  She is compliant with rx, has not had a recent TSH but her PCP has ordered it.   She is not very active at baseline. She attends a dance class once a week. She gets SOB climbing stairs, has not done so in a long time.    Past Medical History:  Diagnosis Date  . Hypertension   . Hypothyroidism   . Hypothyroidism   . Obesity     Past Surgical History:  Procedure Laterality Date  . KELOID EXCISION      Current Outpatient Prescriptions  Medication Sig Dispense Refill  . amLODipine (NORVASC) 10 MG tablet Take 10 mg by mouth daily.    Marland Kitchen. levothyroxine (SYNTHROID, LEVOTHROID) 88 MCG tablet Take 88 mcg by mouth daily.    Marland Kitchen. losartan-hydrochlorothiazide (HYZAAR) 50-12.5 MG per tablet Take 1 tablet by mouth daily.    . Multiple Vitamins-Minerals (MULTIVITAMIN WITH MINERALS) tablet Take 1 tablet by mouth daily.    . metoprolol tartrate (LOPRESSOR) 25 MG tablet  Take 0.5 tablets (12.5 mg total) by mouth 2 (two) times daily. (Patient not taking: Reported on 12/14/2016) 30 tablet 0   No current facility-administered medications for this visit.     Allergies:   Patient has no known allergies.    Social History:  The patient  reports that she has never smoked. She has never used smokeless tobacco. She reports that she does not drink alcohol or use drugs.   Family History:  The patient's family history includes Diabetes in her father; Heart attack in her cousin; Hyperlipidemia in her father and mother; Hypertension in her father and mother.    ROS:  Please see the history of present illness. All other systems are reviewed and negative.    PHYSICAL  EXAM: VS:  BP 125/84 (BP Location: Left Arm, Patient Position: Sitting, Cuff Size: Large)   Pulse 66   Ht 5\' 11"  (1.803 m)   Wt (!) 330 lb (149.7 kg)   LMP 11/22/2016 (Exact Date)   BMI 46.03 kg/m  , BMI Body mass index is 46.03 kg/m. GEN: Well nourished, well developed, female in no acute distress  HEENT: normal for age  Neck: no JVD seen but difficult to assess 2nd body habitus, no carotid bruit, no masses Cardiac: RRR; no murmur, no rubs, or gallops Respiratory:  clear to auscultation bilaterally, but BS decreased at bases, normal work of breathing GI: soft, nontender, nondistended, + BS MS: no deformity or atrophy; no edema; distal pulses are 2+ in all 4 extremities   Skin: warm and dry, no rash Neuro:  Strength and sensation are intact Psych: euthymic mood, full affect   EKG:  EKG is not ordered today. The ECG from 02/28 ER visit demonstrates SR, HR 90, no acute changes   Recent Labs: 09/02/2016: ALT 20 12/10/2016: BUN 9; Creatinine, Ser 0.75; Hemoglobin 12.7; Platelets 230; Potassium 3.6; Sodium 137    Lipid Panel No results found for: CHOL, TRIG, HDL, CHOLHDL, VLDL, LDLCALC, LDLDIRECT   Wt Readings from Last 3 Encounters:  12/14/16 (!) 330 lb (149.7 kg)  12/13/16 (!) 333 lb (151 kg)  12/10/16 (!) 333 lb (151 kg)     Other studies Reviewed: Additional studies/ records that were reviewed today include: hospital records.  ASSESSMENT AND PLAN:The patient and the plan were reviewed with Dr. Herbie Baltimore who agrees  1.  Palpitations: pt will need to wear an Event monitor, follow up after that. No arrhythmias documented in 2 ER visits. She had been started on metoprolol by the ER, she had not started taking yet, she should start.   2. Chest pain: Had once in the setting of palpitations, she has not had exertional symptoms. Because she also had palpitations, will ck echo. If that is normal, hold off on ischemic testing for now. If she has additional episodes or her echo is  abnormal, will need ischemic evaluation.  3. Hypothyroid: get TSH per PCP, continue rx. If Synthroid dose too high, that could contribute to palpitations.   Current medicines are reviewed at length with the patient today.  The patient does not have concerns regarding medicines.  The following changes have been made:  Go ahead and start metoprolol as ordered.  Labs/ tests ordered today include:  No orders of the defined types were placed in this encounter.    Disposition:   FU with Dr Herbie Baltimore after monitor.   Tawny Asal  12/14/2016 3:15 PM    [facility] Phone: [phone]; Fax: [phone]  This note was written with the assistance of speech recognition software. Please excuse any transcriptional errors.

## 2016-12-14 NOTE — Patient Instructions (Addendum)
Medication Instructions:  START Metoprolol  Labwork: None   Testing/Procedures: Your physician has recommended that you wear an 30 DAY event monitor. Event monitors are medical devices that record the heart's electrical activity. Doctors most often us these monitors to diagnose arrhythmias. Arrhythmias are problems with the speed or rhythm of the heartbeat. The monitor is a small, portable device. You can wear one while you do your normal daily activities. This is usually used to diagnose what is causing palpitations/syncope (passing out).  Your physician has requested that you have an echocardiogram. Echocardiography is a painless test that uses sound waves to create images of your heart. It provides your doctor with information about the size and shape of your heart and how well your heart's chambers and valves are working. This procedure takes approximately one hour. There are no restrictions for this procedure.  BOTH TESTS ARE COMPLETED AT THE [address] OFFICE: [address]  Follow-Up: Your physician recommends that you schedule a follow-up appointment in: 6 WEEKS [facility]    Any Other Special Instructions Will Be Listed Below (If Applicable).  If you need a refill on your cardiac medications before your next appointment, please call your pharmacy.

## 2016-12-20 ENCOUNTER — Other Ambulatory Visit: Payer: Self-pay | Admitting: Physician Assistant

## 2016-12-20 DIAGNOSIS — R002 Palpitations: Secondary | ICD-10-CM

## 2016-12-20 DIAGNOSIS — R079 Chest pain, unspecified: Secondary | ICD-10-CM

## 2016-12-21 ENCOUNTER — Encounter: Payer: Self-pay | Admitting: *Deleted

## 2016-12-21 NOTE — Progress Notes (Signed)
Please call and see if she is able to reschedule Thanks

## 2016-12-21 NOTE — Progress Notes (Signed)
Patient ID: Julia Mccoy, female   DOBTilden Mccoy: 1976/10/15, 41 y.o.   MRN: [id number] Patient did not show up for 12/21/16, 9:00, appointment, to have a 30 day cardiac event monitor applied.  She has a follow up appointment scheduled with Dr Herbie BaltimoreHarding on 01/31/17.

## 2016-12-25 NOTE — Progress Notes (Signed)
Let's sure that she does not, in for a follow-up appointment with me until she has had the monitor on. Otherwise I would be wasting my time and hers.  Bryan Lemmaavid Charlann Wayne, MD

## 2016-12-26 NOTE — Progress Notes (Signed)
Pt is coming on 03/15 for the monitor.  Her appt with Dr Herbie BaltimoreHarding is 04/18.  Do I need to move the f/u appt out?

## 2016-12-27 NOTE — Progress Notes (Signed)
Agreed -

## 2016-12-27 NOTE — Progress Notes (Signed)
Yes, please move patient follow up appointment with Dr Herbie BaltimoreHarding  At least 2 -3 weeks out from the completion of monitor  thanks

## 2016-12-28 ENCOUNTER — Ambulatory Visit (INDEPENDENT_AMBULATORY_CARE_PROVIDER_SITE_OTHER): Payer: BLUE CROSS/BLUE SHIELD

## 2016-12-28 DIAGNOSIS — R079 Chest pain, unspecified: Secondary | ICD-10-CM

## 2016-12-28 DIAGNOSIS — R002 Palpitations: Secondary | ICD-10-CM | POA: Diagnosis not present

## 2017-01-03 ENCOUNTER — Other Ambulatory Visit: Payer: Self-pay

## 2017-01-03 ENCOUNTER — Ambulatory Visit (HOSPITAL_COMMUNITY): Payer: BLUE CROSS/BLUE SHIELD | Attending: Internal Medicine

## 2017-01-03 DIAGNOSIS — R002 Palpitations: Secondary | ICD-10-CM | POA: Insufficient documentation

## 2017-01-03 DIAGNOSIS — R072 Precordial pain: Secondary | ICD-10-CM | POA: Insufficient documentation

## 2017-01-05 ENCOUNTER — Telehealth: Payer: Self-pay | Admitting: *Deleted

## 2017-01-05 NOTE — Telephone Encounter (Signed)
Left msg to call.

## 2017-01-05 NOTE — Telephone Encounter (Signed)
-----   Message from Rhonda G Barrett, PA-C sent at 3Darrol Jump/23/2018 12:03 PM EDT ----- Pt of Dr Herbie BaltimoreHarding Please let her know her echo was fine. It did show that her BP has been high for a long time. Good BP control is important, no indication for a stress test at this time f/u with Dr Herbie BaltimoreHarding. Thanks

## 2017-01-10 NOTE — Telephone Encounter (Signed)
Spoke to patient who indicates understanding of results and recommendation to keep good BP control going forward. She is aware she will need to f/u w Dr. Herbie BaltimoreHarding as scheduled.   She also informed me she was having some issues w the monitor and "could not get it to work correctly" since being fitted.  She requested call from monitor techs - please assist patient if able. Thanks

## 2017-01-15 NOTE — Telephone Encounter (Signed)
Left message to contact the monitor company, [employer], directly at [phone].  They will be able to assist and troubleshoot any problems with their monitor.  They are available 24/7 for assistance, parts, or supplies.

## 2017-01-31 ENCOUNTER — Ambulatory Visit: Payer: BLUE CROSS/BLUE SHIELD | Admitting: Cardiology

## 2017-02-23 ENCOUNTER — Ambulatory Visit: Payer: BLUE CROSS/BLUE SHIELD | Admitting: Cardiology

## 2017-03-09 ENCOUNTER — Telehealth: Payer: Self-pay | Admitting: *Deleted

## 2017-03-09 NOTE — Telephone Encounter (Signed)
-----   Message from Marykay Lexavid W Harding, MD sent at 03/07/2017  1:04 AM EDT ----- Pretty normal monitor. We can discuss and follow-up no arrhythmias noted. Nothing significant  dh

## 2017-03-09 NOTE — Telephone Encounter (Signed)
LEFT MESSAGE TO CALL BACK IN REGARDS TO EVENT MONITOR RESULTS  PATIENT ALSO NEEDS FOLLOW UP APPOINTMENT FOR MONITOR AND ECHO- IF LIKE DISCUSS FURTHER.

## 2017-03-19 NOTE — Telephone Encounter (Signed)
Spoke to patient on 03/14/17  Result given and follow up appointment

## 2017-04-04 ENCOUNTER — Ambulatory Visit: Payer: BLUE CROSS/BLUE SHIELD | Admitting: Cardiology

## 2017-04-11 ENCOUNTER — Ambulatory Visit: Payer: BLUE CROSS/BLUE SHIELD | Admitting: Cardiology

## 2017-04-11 NOTE — Progress Notes (Deleted)
PCP: Heron Nay, PA  Clinic Note: No chief complaint on file.   HPI: Julia Mccoy is a 41 y.o. female who is being seen today for follow-up evaluation of rapid palpitations at the request of Heron Nay, PA.  Julia Mccoy was initially seen in consultation by Theodore Demark, PA-C-C March 2018 -> she apparently does not emergency room on February 25 complained of palpitations and then 3 days later went in with palpitations and chest tightness. She is referred for cardiology evaluation. When she saw Bjorn Loser she described 10 episodes of sudden onset high blood pressure and palpitations lasting roughly 10-15 minutes. She was started on Prozac by her PCP. She was evaluated with a event monitor and echocardiogram.  Recent Hospitalizations: ***  Studies Personally Reviewed - (if available, images/films reviewed: From Epic Chart or Care Everywhere)  30 day Event Monitor March 2018: Normal study Mostly normal sinus rhythm with sinus bradycardia and tachycardia.  Minimum heart rate 56 bpm at 11:55 PM. Maximum heart rate 145 bpm at 5:31 AM  No PVCs, PACs or arrhythmias noted.   2-D echo 01/03/2017: Normal LV size with moderate LVH. EF 65%.  No RWMA. GR 1 DD w/ indeterminate filling.  Interval History: ***   No chest pain or shortness of breath with rest or exertion. No PND, orthopnea or edema. No palpitations, lightheadedness, dizziness, weakness or syncope/near syncope. No TIA/amaurosis fugax symptoms. No melena, hematochezia, hematuria, or epstaxis. No claudication.  ROS: A comprehensive was performed. ROS   I have reviewed and (if needed) personally updated the patient's problem list, medications, allergies, past medical and surgical history, social and family history.   Past Medical History:  Diagnosis Date  . Hypertension   . Hypothyroidism   . Obesity     Past Surgical History:  Procedure Laterality Date  . KELOID EXCISION      No outpatient prescriptions have  been marked as taking for the 04/11/17 encounter (Appointment) with Marykay Lex, MD.    No Known Allergies  Social History   Social History  . Marital status: Single    Spouse name: N/A  . Number of children: N/A  . Years of education: N/A   Social History Main Topics  . Smoking status: Never Smoker  . Smokeless tobacco: Never Used  . Alcohol use No  . Drug use: No  . Sexual activity: Not on file   Other Topics Concern  . Not on file   Social History Narrative  . No narrative on file    family history includes Diabetes in her father; Heart attack in her cousin; Hyperlipidemia in her father and mother; Hypertension in her father and mother.  Wt Readings from Last 3 Encounters:  12/14/16 (!) 330 lb (149.7 kg)  12/13/16 (!) 333 lb (151 kg)  12/10/16 (!) 333 lb (151 kg)    PHYSICAL EXAM There were no vitals taken for this visit. General appearance: alert, cooperative, appears stated age, no distress. Morbidly obese HEENT: [place]/AT, EOMI, MMM, anicteric sclera Neck: no adenopathy, no carotid bruit and no JVD Lungs: clear to auscultation bilaterally, normal percussion bilaterally and non-labored Heart: regular rate and rhythm, S1 &S2 normal, no murmur, click, rub or gallop; nondisplaced PMI. Difficult to palpate Abdomen: soft, non-tender; bowel sounds normal; no masses,  no organomegaly; no HJR Extremities: extremities normal, atraumatic, no cyanosis, or edema  Pulses: 2+ and symmetric;  Neurologic: Mental status: Alert & oriented x 3, thought content appropriate; non-focal exam.  Pleasant mood & affect.  Adult ECG Report  Rate: *** ;  Rhythm: {rhythm:17366};   Narrative Interpretation: ***   Other studies Reviewed: Additional studies/ records that were reviewed today include:  Recent Labs:  ***     ASSESSMENT / PLAN: Problem List Items Addressed This Visit    None      Current medicines are reviewed at length with the patient today. (+/- concerns)  *** The following changes have been made: ***  There are no Patient Instructions on file for this visit.  Studies Ordered:   No orders of the defined types were placed in this encounter.     Bryan Lemmaavid Forrest Jaroszewski, M.D., M.S. Interventional Cardiologist   Pager # [phone] Phone # [phone] [address] [place], [place] [address]

## 2017-12-19 DIAGNOSIS — Z800 Family history of malignant neoplasm of digestive organs: Secondary | ICD-10-CM | POA: Insufficient documentation

## 2017-12-19 DIAGNOSIS — K5909 Other constipation: Secondary | ICD-10-CM | POA: Insufficient documentation

## 2019-12-08 ENCOUNTER — Emergency Department (HOSPITAL_BASED_OUTPATIENT_CLINIC_OR_DEPARTMENT_OTHER): Payer: 59

## 2019-12-08 ENCOUNTER — Encounter (HOSPITAL_BASED_OUTPATIENT_CLINIC_OR_DEPARTMENT_OTHER): Payer: Self-pay | Admitting: *Deleted

## 2019-12-08 ENCOUNTER — Other Ambulatory Visit: Payer: Self-pay

## 2019-12-08 ENCOUNTER — Emergency Department (HOSPITAL_BASED_OUTPATIENT_CLINIC_OR_DEPARTMENT_OTHER)
Admission: EM | Admit: 2019-12-08 | Discharge: 2019-12-08 | Disposition: A | Discharge status: Home / Self Care | Payer: 59 | Attending: Emergency Medicine | Admitting: Emergency Medicine

## 2019-12-08 DIAGNOSIS — E039 Hypothyroidism, unspecified: Secondary | ICD-10-CM | POA: Diagnosis not present

## 2019-12-08 DIAGNOSIS — Z79899 Other long term (current) drug therapy: Secondary | ICD-10-CM | POA: Diagnosis not present

## 2019-12-08 DIAGNOSIS — R0789 Other chest pain: Secondary | ICD-10-CM | POA: Diagnosis not present

## 2019-12-08 DIAGNOSIS — I10 Essential (primary) hypertension: Secondary | ICD-10-CM | POA: Insufficient documentation

## 2019-12-08 DIAGNOSIS — R079 Chest pain, unspecified: Secondary | ICD-10-CM

## 2019-12-08 LAB — CBC WITH DIFFERENTIAL/PLATELET
Abs Immature Granulocytes: 0.02 10*3/uL (ref 0.00–0.07)
Basophils Absolute: 0 10*3/uL (ref 0.0–0.1)
Basophils Relative: 0 %
Eosinophils Absolute: 0 10*3/uL (ref 0.0–0.5)
Eosinophils Relative: 0 %
HCT: 39.3 % (ref 36.0–46.0)
Hemoglobin: 12.4 g/dL (ref 12.0–15.0)
Immature Granulocytes: 0 %
Lymphocytes Relative: 29 %
Lymphs Abs: 1.8 10*3/uL (ref 0.7–4.0)
MCH: 27.3 pg (ref 26.0–34.0)
MCHC: 31.6 g/dL (ref 30.0–36.0)
MCV: 86.6 fL (ref 80.0–100.0)
Monocytes Absolute: 0.4 10*3/uL (ref 0.1–1.0)
Monocytes Relative: 7 %
Neutro Abs: 3.9 10*3/uL (ref 1.7–7.7)
Neutrophils Relative %: 64 %
Platelets: 248 10*3/uL (ref 150–400)
RBC: 4.54 MIL/uL (ref 3.87–5.11)
RDW: 14.7 % (ref 11.5–15.5)
WBC: 6.2 10*3/uL (ref 4.0–10.5)
nRBC: 0 % (ref 0.0–0.2)

## 2019-12-08 LAB — COMPREHENSIVE METABOLIC PANEL
ALT: 16 U/L (ref 0–44)
AST: 17 U/L (ref 15–41)
Albumin: 3.9 g/dL (ref 3.5–5.0)
Alkaline Phosphatase: 53 U/L (ref 38–126)
Anion gap: 9 (ref 5–15)
BUN: 13 mg/dL (ref 6–20)
CO2: 23 mmol/L (ref 22–32)
Calcium: 9 mg/dL (ref 8.9–10.3)
Chloride: 103 mmol/L (ref 98–111)
Creatinine, Ser: 0.71 mg/dL (ref 0.44–1.00)
GFR calc Af Amer: >60 mL/min (ref >60)
GFR calc non Af Amer: >60 mL/min (ref >60)
Glucose, Bld: 132 mg/dL — ABNORMAL HIGH (ref 70–99)
Potassium: 3.3 mmol/L — ABNORMAL LOW (ref 3.5–5.1)
Sodium: 135 mmol/L (ref 135–145)
Total Bilirubin: 0.5 mg/dL (ref 0.3–1.2)
Total Protein: 8.4 g/dL — ABNORMAL HIGH (ref 6.5–8.1)

## 2019-12-08 LAB — PREGNANCY, URINE: Preg Test, Ur: NEGATIVE

## 2019-12-08 LAB — TROPONIN I (HIGH SENSITIVITY)
Troponin I (High Sensitivity): 3 ng/L (ref <18)
Troponin I (High Sensitivity): 2 ng/L (ref <18)

## 2019-12-08 LAB — TSH: TSH: 2.199 u[IU]/mL (ref 0.350–4.500)

## 2019-12-08 NOTE — ED Triage Notes (Signed)
Her feels like her BP has been elevated last night. Her arms were tingling. States she just felt like her BP was high. She did not take her BP at home.

## 2019-12-08 NOTE — ED Provider Notes (Signed)
[facility] EMERGENCY DEPARTMENT Provider Note   CSN: [id number] Arrival date & time: 12/08/19  1109     History Chief Complaint  Patient presents with  . Hypertension    Terrell Shimko is a 44 y.o. female with a pertinent PMH of HTN, hypothyroid, GAD, who presents with a chief complaint of chest pain.  HPI  Patient presents with acute onset chest pain that started last night. The chest pain is central with associated palpitations and intermittent tingling of arms and legs. She admits to being anxious over night, which made it difficult for her to sleep. She admits to a single episode of emesis this morning without nausea. She denies shortness of breath, orthopnea, PND, or LE edema. She denies smoking, OCPs, Hx of DM, recent travel, prolonged immobilization, hx of cancer, or recent trauma. She does have a history of HTN, Hypothyroidism, family history of CAD.    Past Medical History:  Diagnosis Date  . Hypertension   . Hypothyroidism   . Obesity     Patient Active Problem List   Diagnosis Date Noted  . Hypothyroidism     Past Surgical History:  Procedure Laterality Date  . KELOID EXCISION       OB History   No obstetric history on file.     Family History  Problem Relation Age of Onset  . Hypertension Mother   . Hyperlipidemia Mother   . Hypertension Father   . Hyperlipidemia Father   . Diabetes Father   . Heart attack Cousin     Social History   Tobacco Use  . Smoking status: Never Smoker  . Smokeless tobacco: Never Used  Substance Use Topics  . Alcohol use: No  . Drug use: No    Home Medications Prior to Admission medications   Medication Sig Start Date End Date Taking? Authorizing Provider  amLODipine (NORVASC) 10 MG tablet Take 10 mg by mouth daily.   Yes [provider]  levothyroxine (SYNTHROID, LEVOTHROID) 88 MCG tablet Take 88 mcg by mouth daily.   Yes [provider]  losartan-hydrochlorothiazide (HYZAAR) 50-12.5  MG per tablet Take 1 tablet by mouth daily.   Yes [provider]  metoprolol tartrate (LOPRESSOR) 25 MG tablet Take 0.5 tablets (12.5 mg total) by mouth 2 (two) times daily. Patient not taking: Reported on 12/14/2016 12/10/16   Arby Barrette, MD  Multiple Vitamins-Minerals (MULTIVITAMIN WITH MINERALS) tablet Take 1 tablet by mouth daily.    [provider]    Allergies    Patient has no known allergies.  Review of Systems   Review of Systems - All review of systems are negative except what is noted on the HPI.  Physical Exam Updated Vital Signs BP (!) 142/77 (BP Location: Right Arm)   Pulse 72   Temp 98.7 F (37.1 C) (Oral)   Resp (!) 22   Ht 5\' 11"  (1.803 m)   Wt (!) 157.9 kg   LMP 11/08/2019   SpO2 100%   BMI 48.54 kg/m   Physical Exam Constitutional:      Appearance: She is obese.     Comments: Appears anxious  HENT:     Head: Normocephalic and atraumatic.  Eyes:     Extraocular Movements: Extraocular movements intact.     Conjunctiva/sclera: Conjunctivae normal.     Pupils: Pupils are equal, round, and reactive to light.  Cardiovascular:     Rate and Rhythm: Regular rhythm. Tachycardia present.     Pulses: Normal pulses.  Heart sounds: Normal heart sounds.  Pulmonary:     Effort: Pulmonary effort is normal. No respiratory distress.     Breath sounds: Normal breath sounds.  Abdominal:     General: There is no distension.     Tenderness: There is no abdominal tenderness.  Musculoskeletal:        General: No swelling, tenderness, deformity or signs of injury. Normal range of motion.     Cervical back: Normal range of motion. No rigidity or tenderness.  Skin:    General: Skin is warm and dry.     Capillary Refill: Capillary refill takes less than 2 seconds.  Neurological:     General: No focal deficit present.     Mental Status: She is alert and oriented to person, place, and time.     Cranial Nerves: No cranial nerve deficit.     Sensory:  Sensation is intact. No sensory deficit.     Motor: No weakness.     Coordination: Coordination is intact. Coordination normal.     Gait: Gait is intact. Gait normal.     ED Results / Procedures / Treatments   Labs (all labs ordered are listed, but only abnormal results are displayed) Labs Reviewed  COMPREHENSIVE METABOLIC PANEL - Abnormal; Notable for the following components:      Result Value   Potassium 3.3 (*)    Glucose, Bld 132 (*)    Total Protein 8.4 (*)    All other components within normal limits  CBC WITH DIFFERENTIAL/PLATELET  PREGNANCY, URINE  TSH  TROPONIN I (HIGH SENSITIVITY)  TROPONIN I (HIGH SENSITIVITY)    EKG EKG Interpretation  Date/Time:  Monday December 08 2019 11:39:56 EST Ventricular Rate:  79 PR Interval:    QRS Duration: 84 QT Interval:  408 QTC Calculation: 468 R Axis:   81 Text Interpretation: Sinus rhythm ST elev, probable normal early repol pattern Baseline wander in lead(s) V6 No significant change since last tracing Confirmed by Frederick Peers [phone]) on 12/08/2019 12:03:14 PM   Radiology DG Chest Portable 1 View  Result Date: 12/08/2019 CLINICAL DATA:  Chest pain, hypertension EXAM: PORTABLE CHEST 1 VIEW COMPARISON:  12/10/2016 FINDINGS: The heart size and mediastinal contours are within normal limits. No focal airspace consolidation, pleural effusion, or pneumothorax. The visualized skeletal structures are unremarkable. IMPRESSION: No active disease. Electronically Signed   By: Duanne Guess D.O.   On: 12/08/2019 12:05    Procedures Procedures (including critical care time)  Medications Ordered in ED Medications - No data to display  ED Course  I have reviewed the triage vital signs and the nursing notes.  Pertinent labs & imaging results that were available during my care of the patient were reviewed by me and considered in my medical decision making (see chart for details).    MDM Rules/Calculators/A&P                         Final Clinical Impression(s) / ED Diagnoses Final diagnoses:  Chest pain, unspecified type   Chest pain: Patient presented with acute onset chest pain and bilateral extremity parethesias. She is not currently having chest pain or paresthesias. Her 2x troponins are WNL.  Patient was counseled on return precautions and she states that she understands.  Counseled on the importance of following up with her PCP. - Follow up with PCP  Rx / DC Orders ED Discharge Orders    None       Dellia Cloud,  MD 12/08/19 P[place], Wenda Overland, MD 12/09/19 [phone]

## 2019-12-08 NOTE — Discharge Instructions (Addendum)
Thank you for letting us take part in your care. Today we discussed chest pain. Please see the instruction notes below"\  Instructions: Please follow up with your PCP.

## 2020-02-03 ENCOUNTER — Emergency Department (HOSPITAL_BASED_OUTPATIENT_CLINIC_OR_DEPARTMENT_OTHER): Payer: 59

## 2020-02-03 ENCOUNTER — Other Ambulatory Visit: Payer: Self-pay

## 2020-02-03 ENCOUNTER — Encounter (HOSPITAL_BASED_OUTPATIENT_CLINIC_OR_DEPARTMENT_OTHER): Payer: Self-pay | Admitting: Emergency Medicine

## 2020-02-03 ENCOUNTER — Emergency Department (HOSPITAL_BASED_OUTPATIENT_CLINIC_OR_DEPARTMENT_OTHER)
Admission: EM | Admit: 2020-02-03 | Discharge: 2020-02-03 | Disposition: A | Discharge status: Home / Self Care | Payer: 59 | Attending: Emergency Medicine | Admitting: Emergency Medicine

## 2020-02-03 DIAGNOSIS — I10 Essential (primary) hypertension: Secondary | ICD-10-CM | POA: Insufficient documentation

## 2020-02-03 DIAGNOSIS — G44209 Tension-type headache, unspecified, not intractable: Secondary | ICD-10-CM | POA: Diagnosis not present

## 2020-02-03 DIAGNOSIS — Z79899 Other long term (current) drug therapy: Secondary | ICD-10-CM | POA: Insufficient documentation

## 2020-02-03 DIAGNOSIS — R519 Headache, unspecified: Secondary | ICD-10-CM | POA: Diagnosis present

## 2020-02-03 DIAGNOSIS — E039 Hypothyroidism, unspecified: Secondary | ICD-10-CM | POA: Insufficient documentation

## 2020-02-03 HISTORY — DX: Anxiety disorder, unspecified: F41.9

## 2020-02-03 LAB — BASIC METABOLIC PANEL
Anion gap: 10 (ref 5–15)
BUN: 16 mg/dL (ref 6–20)
CO2: 23 mmol/L (ref 22–32)
Calcium: 9.2 mg/dL (ref 8.9–10.3)
Chloride: 102 mmol/L (ref 98–111)
Creatinine, Ser: 0.84 mg/dL (ref 0.44–1.00)
GFR calc Af Amer: >60 mL/min (ref >60)
GFR calc non Af Amer: >60 mL/min (ref >60)
Glucose, Bld: 100 mg/dL — ABNORMAL HIGH (ref 70–99)
Potassium: 3.7 mmol/L (ref 3.5–5.1)
Sodium: 135 mmol/L (ref 135–145)

## 2020-02-03 LAB — CBC WITH DIFFERENTIAL/PLATELET
Abs Immature Granulocytes: 0.01 10*3/uL (ref 0.00–0.07)
Basophils Absolute: 0 10*3/uL (ref 0.0–0.1)
Basophils Relative: 0 %
Eosinophils Absolute: 0.1 10*3/uL (ref 0.0–0.5)
Eosinophils Relative: 1 %
HCT: 41.9 % (ref 36.0–46.0)
Hemoglobin: 13.2 g/dL (ref 12.0–15.0)
Immature Granulocytes: 0 %
Lymphocytes Relative: 29 %
Lymphs Abs: 2.2 10*3/uL (ref 0.7–4.0)
MCH: 27.7 pg (ref 26.0–34.0)
MCHC: 31.5 g/dL (ref 30.0–36.0)
MCV: 87.8 fL (ref 80.0–100.0)
Monocytes Absolute: 0.8 10*3/uL (ref 0.1–1.0)
Monocytes Relative: 11 %
Neutro Abs: 4.4 10*3/uL (ref 1.7–7.7)
Neutrophils Relative %: 59 %
Platelets: 220 10*3/uL (ref 150–400)
RBC: 4.77 MIL/uL (ref 3.87–5.11)
RDW: 14.5 % (ref 11.5–15.5)
WBC: 7.6 10*3/uL (ref 4.0–10.5)
nRBC: 0 % (ref 0.0–0.2)

## 2020-02-03 MED ORDER — DIPHENHYDRAMINE HCL 50 MG/ML IJ SOLN
12.5000 mg | Freq: Once | INTRAMUSCULAR | Status: AC
  Administered 2020-02-03: 12.5 mg via INTRAVENOUS
  Filled 2020-02-03: qty 1

## 2020-02-03 MED ORDER — KETOROLAC TROMETHAMINE 30 MG/ML IJ SOLN
30.0000 mg | Freq: Once | INTRAMUSCULAR | Status: AC
  Administered 2020-02-03: 30 mg via INTRAVENOUS
  Filled 2020-02-03: qty 1

## 2020-02-03 MED ORDER — IOHEXOL 350 MG/ML SOLN
100.0000 mL | Freq: Once | INTRAVENOUS | Status: AC | PRN
  Administered 2020-02-03: 100 mL via INTRAVENOUS

## 2020-02-03 MED ORDER — METOCLOPRAMIDE HCL 5 MG/ML IJ SOLN
5.0000 mg | Freq: Once | INTRAMUSCULAR | Status: AC
  Administered 2020-02-03: 5 mg via INTRAVENOUS
  Filled 2020-02-03: qty 2

## 2020-02-03 MED ORDER — SODIUM CHLORIDE 0.9 % IV BOLUS
1000.0000 mL | Freq: Once | INTRAVENOUS | Status: AC
  Administered 2020-02-03: 1000 mL via INTRAVENOUS

## 2020-02-03 NOTE — ED Provider Notes (Signed)
[facility] EMERGENCY DEPARTMENT Provider Note   CSN: [id number] Arrival date & time: 02/03/20  1643    History Chief Complaint  Patient presents with  . Headache   Julia Mccoy is a 44 y.o. female with past medical history significant for hypertension who presents for evaluation of headache.  Patient with intermittent headaches over the last 2 weeks.  States pain always starts at the posterior aspect of her head and radiates into her temples.  Today she states she has worsening neck pain located to her left paraspinal muscles which radiates into her posterior scalp.  No recent injury or trauma.  Denies any sudden onset thunderclap headache.  She has no associated visual changes, weakness or paresthesias.  Has been taking aspirin at home for her headaches which resolves her headaches however this did not resolve her headache or neck pain today.  She rates her pain a 10/10.  Denies facial droop, difficulty with word speaking, unilateral weakness, nausea, vomiting, chest pain, shortness of breath, photophobia, phonophobia, abdominal pain, lateral weakness, paresthesias.  Denies additional aggravating or alleviating factors.  States she did start using a different pillow 1 week ago because she thought her prior pillows are what was causing her HAs.  History obtained from patient and past medical records.  No interpreter is used.  HPI     Past Medical History:  Diagnosis Date  . Anxiety   . Hypertension   . Hypothyroidism   . Obesity     Patient Active Problem List   Diagnosis Date Noted  . Hypothyroidism     Past Surgical History:  Procedure Laterality Date  . KELOID EXCISION       OB History   No obstetric history on file.     Family History  Problem Relation Age of Onset  . Hypertension Mother   . Hyperlipidemia Mother   . Hypertension Father   . Hyperlipidemia Father   . Diabetes Father   . Heart attack Cousin     Social History   Tobacco Use  .  Smoking status: Never Smoker  . Smokeless tobacco: Never Used  Substance Use Topics  . Alcohol use: No  . Drug use: No    Home Medications Prior to Admission medications   Medication Sig Start Date End Date Taking? Authorizing Provider  amLODipine (NORVASC) 10 MG tablet Take 10 mg by mouth daily.    [provider]  busPIRone (BUSPAR) 10 MG tablet Take 10 mg by mouth 2 (two) times daily as needed. 11/27/19   [provider]  FLUoxetine (PROZAC) 20 MG capsule Take 20 mg by mouth daily. 11/27/19   [provider]  levothyroxine (SYNTHROID, LEVOTHROID) 88 MCG tablet Take 88 mcg by mouth daily.    [provider]  losartan-hydrochlorothiazide (HYZAAR) 50-12.5 MG per tablet Take 1 tablet by mouth daily.    [provider]    Allergies    Patient has no known allergies.  Review of Systems   Review of Systems  Constitutional: Negative.   HENT: Negative.   Respiratory: Negative.   Cardiovascular: Negative.   Gastrointestinal: Negative.   Genitourinary: Negative.   Musculoskeletal: Positive for neck pain. Negative for arthralgias, back pain, gait problem, joint swelling, myalgias and neck stiffness.  Skin: Negative.   Neurological: Positive for headaches. Negative for dizziness, tremors, seizures, syncope, facial asymmetry, speech difficulty, weakness, light-headedness and numbness.  All other systems reviewed and are negative.  Physical Exam Updated Vital Signs BP (!) 148/89 (BP Location:  Right Arm)   Pulse 67   Temp 99.4 F (37.4 C) (Oral)   Resp 19   Ht 5\' 11"  (1.803 m)   Wt (!) 151.4 kg   LMP 01/06/2020   SpO2 100%   BMI 46.54 kg/m   Physical Exam Physical Exam  Constitutional: Pt is oriented to person, place, and time. Pt appears well-developed and well-nourished. No distress. Sitting in room playing game on phone looking down at phone. HENT:  Head: Normocephalic and atraumatic.  Mouth/Throat: Oropharynx is clear and moist.    Eyes: Conjunctivae and EOM are normal. Pupils are equal, round, and reactive to light. No scleral icterus.  No horizontal, vertical or rotational nystagmus  Neck: Normal range of motion. Neck supple.  Full active and passive ROM without pain Moderate tenderness to left paraspinal area of posterior neck No nuchal rigidity or meningeal signs  Cardiovascular: Normal rate, regular rhythm and intact distal pulses.   Pulmonary/Chest: Effort normal and breath sounds normal. No respiratory distress. Pt has no wheezes. No rales.  Abdominal: Soft. Bowel sounds are normal. There is no tenderness. There is no rebound and no guarding.  Musculoskeletal: Normal range of motion.  Lymphadenopathy:    No cervical adenopathy.  Neurological: Pt. is alert and oriented to person, place, and time. He has normal reflexes. No cranial nerve deficit.  Exhibits normal muscle tone. Coordination normal.  Mental Status:  Alert, oriented, thought content appropriate. Speech fluent without evidence of aphasia. Able to follow 2 step commands without difficulty.  Cranial Nerves:  II:  Peripheral visual fields grossly normal, pupils equal, round, reactive to light III,IV, VI: ptosis not present, extra-ocular motions intact bilaterally  V,VII: smile symmetric, facial light touch sensation equal VIII: hearing grossly normal bilaterally  IX,X: midline uvula rise  XI: bilateral shoulder shrug equal and strong XII: midline tongue extension  Motor:  5/5 in upper and lower extremities bilaterally including strong and equal grip strength and dorsiflexion/plantar flexion Sensory: Pinprick and light touch normal in all extremities.  Deep Tendon Reflexes: 2+ and symmetric  Cerebellar: normal finger-to-nose with bilateral upper extremities Gait: normal gait and balance CV: distal pulses palpable throughout   Skin: Skin is warm and dry. No rash noted. Pt is not diaphoretic.  Psychiatric: Pt has a normal mood and affect. Behavior is  normal. Judgment and thought content normal.  Nursing note and vitals reviewed. ED Results / Procedures / Treatments   Labs (all labs ordered are listed, but only abnormal results are displayed) Labs Reviewed  BASIC METABOLIC PANEL - Abnormal; Notable for the following components:      Result Value   Glucose, Bld 100 (*)    All other components within normal limits  CBC WITH DIFFERENTIAL/PLATELET    EKG None  Radiology CT Angio Head W or Wo Contrast  Result Date: 02/03/2020 CLINICAL DATA:  Initial evaluation for acute headache. EXAM: CT ANGIOGRAPHY HEAD AND NECK TECHNIQUE: Multidetector CT imaging of the head and neck was performed using the standard protocol during bolus administration of intravenous contrast. Multiplanar CT image reconstructions and MIPs were obtained to evaluate the vascular anatomy. Carotid stenosis measurements (when applicable) are obtained utilizing NASCET criteria, using the distal internal carotid diameter as the denominator. CONTRAST:  02/05/2020 OMNIPAQUE IOHEXOL 350 MG/ML SOLN COMPARISON:  None. FINDINGS: CT HEAD FINDINGS Brain: Cerebral volume within normal limits for patient age. No evidence for acute intracranial hemorrhage. No findings to suggest acute large vessel territory infarct. No mass lesion, midline shift, or mass effect. Ventricles  are normal in size without evidence for hydrocephalus. No extra-axial fluid collection identified. Empty sella noted. -- Vascular: No hyperdense vessel identified. Skull: Scalp soft tissues demonstrate no acute abnormality. Calvarium intact. Mild hyperostosis frontalis interna noted. Sinuses/Orbits: Globes and orbital soft tissues within normal limits. Opacification of the lateral recess of the left sphenoid sinus. Paranasal sinuses are otherwise clear. No mastoid effusion. CTA NECK FINDINGS Aortic arch: Aortic arch of normal caliber with normal 3 vessel morphology. No flow-limiting stenosis seen about the origin of the great  vessels. Visualized subclavian arteries widely patent. Right carotid system: Right CCA not well assessed proximally due to body habitus. Visualized right common and internal carotid arteries widely patent without stenosis, dissection or occlusion. Left carotid system: Left CCA not well evaluated proximally due to habitus. Visualized left common and internal carotid arteries widely patent without stenosis, dissection or occlusion. Vertebral arteries: Both vertebral arteries arise from the subclavian arteries. Proximal vertebral arteries not well assessed due to habitus. Visualized vertebral arteries widely patent without stenosis, dissection or occlusion. Skeleton: Mild cervical spondylosis at C5-6. No acute osseous abnormality. No discrete or worrisome osseous lesions. Other neck: No other acute soft tissue abnormality within the neck. No mass lesion or adenopathy. Upper chest: Unremarkable. Review of the MIP images confirms the above findings CTA HEAD FINDINGS Anterior circulation: Both internal carotid arteries patent to the termini without stenosis or other abnormality. A1 segments widely patent. Normal anterior communicating artery complex. Anterior cerebral arteries patent to their distal aspects without stenosis. No M1 stenosis or occlusion. Normal MCA bifurcations. Distal MCA branches well perfused and symmetric. Posterior circulation: Both vertebral arteries widely patent to the vertebrobasilar junction without stenosis. Both picas patent. Basilar widely patent to its distal aspect without stenosis. Superior cerebral arteries grossly patent proximally. Both PCAs primarily supplied via the basilar and are well perfused to their distal aspects. Venous sinuses: Patent. Anatomic variants: None significant.  No intracranial aneurysm. Review of the MIP images confirms the above findings IMPRESSION: CT HEAD IMPRESSION: 1. No acute intracranial abnormality. 2. Empty sella. While this finding is often incidental in  nature and of no clinical significance, this can also be seen in the setting of idiopathic intracranial hypertension. 3. Otherwise unremarkable head CT. CTA HEAD AND NECK IMPRESSION: Normal CTA of the head and neck. No large vessel occlusion, hemodynamically significant stenosis, or other acute vascular abnormality. No aneurysm. Electronically Signed   By: Rise Mu M.D.   On: 02/03/2020 21:00   CT Angio Neck W and/or Wo Contrast  Result Date: 02/03/2020 CLINICAL DATA:  Initial evaluation for acute headache. EXAM: CT ANGIOGRAPHY HEAD AND NECK TECHNIQUE: Multidetector CT imaging of the head and neck was performed using the standard protocol during bolus administration of intravenous contrast. Multiplanar CT image reconstructions and MIPs were obtained to evaluate the vascular anatomy. Carotid stenosis measurements (when applicable) are obtained utilizing NASCET criteria, using the distal internal carotid diameter as the denominator. CONTRAST:  OMNIPAQUE IOHEXOL 350 MG/ML SOLN COMPARISON:  None. FINDINGS: CT HEAD FINDINGS Brain: Cerebral volume within normal limits for patient age. No evidence for acute intracranial hemorrhage. No findings to suggest acute large vessel territory infarct. No mass lesion, midline shift, or mass effect. Ventricles are normal in size without evidence for hydrocephalus. No extra-axial fluid collection identified. Empty sella noted. -- Vascular: No hyperdense vessel identified. Skull: Scalp soft tissues demonstrate no acute abnormality. Calvarium intact. Mild hyperostosis frontalis interna noted. Sinuses/Orbits: Globes and orbital soft tissues within normal limits. Opacification of the  lateral recess of the left sphenoid sinus. Paranasal sinuses are otherwise clear. No mastoid effusion. CTA NECK FINDINGS Aortic arch: Aortic arch of normal caliber with normal 3 vessel morphology. No flow-limiting stenosis seen about the origin of the great vessels. Visualized subclavian  arteries widely patent. Right carotid system: Right CCA not well assessed proximally due to body habitus. Visualized right common and internal carotid arteries widely patent without stenosis, dissection or occlusion. Left carotid system: Left CCA not well evaluated proximally due to habitus. Visualized left common and internal carotid arteries widely patent without stenosis, dissection or occlusion. Vertebral arteries: Both vertebral arteries arise from the subclavian arteries. Proximal vertebral arteries not well assessed due to habitus. Visualized vertebral arteries widely patent without stenosis, dissection or occlusion. Skeleton: Mild cervical spondylosis at C5-6. No acute osseous abnormality. No discrete or worrisome osseous lesions. Other neck: No other acute soft tissue abnormality within the neck. No mass lesion or adenopathy. Upper chest: Unremarkable. Review of the MIP images confirms the above findings CTA HEAD FINDINGS Anterior circulation: Both internal carotid arteries patent to the termini without stenosis or other abnormality. A1 segments widely patent. Normal anterior communicating artery complex. Anterior cerebral arteries patent to their distal aspects without stenosis. No M1 stenosis or occlusion. Normal MCA bifurcations. Distal MCA branches well perfused and symmetric. Posterior circulation: Both vertebral arteries widely patent to the vertebrobasilar junction without stenosis. Both picas patent. Basilar widely patent to its distal aspect without stenosis. Superior cerebral arteries grossly patent proximally. Both PCAs primarily supplied via the basilar and are well perfused to their distal aspects. Venous sinuses: Patent. Anatomic variants: None significant.  No intracranial aneurysm. Review of the MIP images confirms the above findings IMPRESSION: CT HEAD IMPRESSION: 1. No acute intracranial abnormality. 2. Empty sella. While this finding is often incidental in nature and of no clinical  significance, this can also be seen in the setting of idiopathic intracranial hypertension. 3. Otherwise unremarkable head CT. CTA HEAD AND NECK IMPRESSION: Normal CTA of the head and neck. No large vessel occlusion, hemodynamically significant stenosis, or other acute vascular abnormality. No aneurysm. Electronically Signed   By: Rise Mu M.D.   On: 02/03/2020 21:00    Procedures Procedures (including critical care time)  Medications Ordered in ED Medications  sodium chloride 0.9 % bolus 1,000 mL (0 mLs Intravenous Stopped 02/03/20 2106)  metoCLOPramide (REGLAN) injection 5 mg (5 mg Intravenous Given 02/03/20 1903)  diphenhydrAMINE (BENADRYL) injection 12.5 mg (12.5 mg Intravenous Given 02/03/20 1905)  iohexol (OMNIPAQUE) 350 MG/ML injection 100 mL (100 mLs Intravenous Contrast Given 02/03/20 1937)  ketorolac (TORADOL) 30 MG/ML injection 30 mg (30 mg Intravenous Given 02/03/20 2109)   ED Course  I have reviewed the triage vital signs and the nursing notes.  Pertinent labs & imaging results that were available during my care of the patient were reviewed by me and considered in my medical decision making (see chart for details).  44 year old female presents for evaluation of headache and neck pain.  She is afebrile, nonseptic, non-ill-appearing.  Patient with intermittent headaches x2 weeks which are normally resolved with aspirin.  Today developed neck pain.  Denies sudden onset thunderclap headache.  No emesis, photophobia or phonophobia.  Did recently try new pillows 1 week ago due to her thinking that her pillows were the cause of her headaches.  With nonfocal neuro exam without deficits.  She does have some moderate tenderness to her left paraspinal muscles.  She has no meningismus.  She looks  overall well.  Her headache and neck pain will obtain CTA head and neck.  We will also give migraine cocktail.  Symptoms do not seem consistent with ICH.  Patient reassess.  Headache resolved  with Reglan and Benadryl.  She is awaiting CT imaging.  Patient reassessed.  CTA head and neck negative for acute dissection.  Possibility of empty sella. Discussed with attending Dr. Rex Kras. Does not need LP here in ED. She can follow up with PCP for HA and Optometry for dilated eye exam to assess for increased pressure. Patient follows with [facility].  Patient has no eye complaints at this time and is requesting dc home. Low suspicion for CVA, dissection, mass, SAH, meningitis, temporal arteritis, acute angle glaucoma.  Pt is afebrile with no focal neuro deficits, nuchal rigidity, or change in vision. Suspect patients neck pain likely MSK related.   The patient has been appropriately medically screened and/or stabilized in the ED. I have low suspicion for any other emergent medical condition which would require further screening, evaluation or treatment in the ED or require inpatient management.  Patient is hemodynamically stable and in no acute distress.  Patient able to ambulate in department prior to ED.  Evaluation does not show acute pathology that would require ongoing or additional emergent interventions while in the emergency department or further inpatient treatment.  I have discussed the diagnosis with the patient and answered all questions.  Pain is been managed while in the emergency department and patient has no further complaints prior to discharge.  Patient is comfortable with plan discussed in room and is stable for discharge at this time.  I have discussed strict return precautions for returning to the emergency department.  Patient was encouraged to follow-up with PCP/specialist refer to at discharge.   MDM Rules/Calculators/A&P                       Final Clinical Impression(s) / ED Diagnoses Final diagnoses:  Acute nonintractable headache, unspecified headache type    Rx / DC Orders ED Discharge Orders    None       Chantia Amalfitano A, PA-C 02/03/20 2143      Little, Wenda Overland, MD 02/03/20 2317

## 2020-02-03 NOTE — Discharge Instructions (Signed)
Need to follow-up with the eye doctor to obtain a better visual inspection to see if there are any signs of increased pressure in your head.  I would suggest you also follow-up with your primary care doctor.  Return for new or worsening symptoms.

## 2020-02-03 NOTE — ED Triage Notes (Signed)
Pt has been having intermittent headaches for the past couple weeks.  They start in the back of her neck and move up to her forehead and over her eyes.  At this time the pain is in the back of her neck.

## 2020-02-03 NOTE — ED Notes (Signed)
Pt transported to radiology.

## 2020-09-08 ENCOUNTER — Encounter (HOSPITAL_COMMUNITY): Payer: Self-pay

## 2020-09-08 ENCOUNTER — Emergency Department (HOSPITAL_COMMUNITY)
Admission: EM | Admit: 2020-09-08 | Discharge: 2020-09-08 | Disposition: A | Discharge status: Home / Self Care | Payer: 59 | Attending: Emergency Medicine | Admitting: Emergency Medicine

## 2020-09-08 ENCOUNTER — Emergency Department (HOSPITAL_COMMUNITY): Payer: 59

## 2020-09-08 DIAGNOSIS — Z79899 Other long term (current) drug therapy: Secondary | ICD-10-CM | POA: Diagnosis not present

## 2020-09-08 DIAGNOSIS — M25531 Pain in right wrist: Secondary | ICD-10-CM | POA: Diagnosis not present

## 2020-09-08 DIAGNOSIS — T23471A Corrosion of unspecified degree of right wrist, initial encounter: Secondary | ICD-10-CM | POA: Diagnosis not present

## 2020-09-08 DIAGNOSIS — I10 Essential (primary) hypertension: Secondary | ICD-10-CM | POA: Diagnosis not present

## 2020-09-08 DIAGNOSIS — T6591XA Toxic effect of unspecified substance, accidental (unintentional), initial encounter: Secondary | ICD-10-CM | POA: Diagnosis not present

## 2020-09-08 DIAGNOSIS — Y92410 Unspecified street and highway as the place of occurrence of the external cause: Secondary | ICD-10-CM | POA: Diagnosis not present

## 2020-09-08 DIAGNOSIS — E039 Hypothyroidism, unspecified: Secondary | ICD-10-CM | POA: Insufficient documentation

## 2020-09-08 DIAGNOSIS — W2211XA Striking against or struck by driver side automobile airbag, initial encounter: Secondary | ICD-10-CM

## 2020-09-08 MED ORDER — NAPROXEN 500 MG PO TABS: 500.0000 mg | ORAL_TABLET | Freq: Two times a day (BID) | ORAL | 0 refills | Status: DC

## 2020-09-08 MED ORDER — IBUPROFEN 800 MG PO TABS
800.0000 mg | ORAL_TABLET | Freq: Once | ORAL | Status: AC
  Administered 2020-09-08: 800 mg via ORAL
  Filled 2020-09-08: qty 1
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
## 2020-09-08 NOTE — ED Notes (Signed)
Ice pack applied.
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
## 2020-10-11 ENCOUNTER — Other Ambulatory Visit: Payer: 59

## 2021-01-07 ENCOUNTER — Encounter (HOSPITAL_BASED_OUTPATIENT_CLINIC_OR_DEPARTMENT_OTHER): Payer: Self-pay

## 2021-01-07 ENCOUNTER — Other Ambulatory Visit: Payer: Self-pay

## 2021-01-07 ENCOUNTER — Emergency Department (HOSPITAL_BASED_OUTPATIENT_CLINIC_OR_DEPARTMENT_OTHER)
Admission: EM | Admit: 2021-01-07 | Discharge: 2021-01-07 | Disposition: A | Discharge status: Home / Self Care | Payer: 59 | Attending: Emergency Medicine | Admitting: Emergency Medicine

## 2021-01-07 ENCOUNTER — Emergency Department (HOSPITAL_BASED_OUTPATIENT_CLINIC_OR_DEPARTMENT_OTHER): Payer: 59

## 2021-01-07 DIAGNOSIS — I10 Essential (primary) hypertension: Secondary | ICD-10-CM | POA: Insufficient documentation

## 2021-01-07 DIAGNOSIS — R110 Nausea: Secondary | ICD-10-CM | POA: Insufficient documentation

## 2021-01-07 DIAGNOSIS — Z79899 Other long term (current) drug therapy: Secondary | ICD-10-CM | POA: Diagnosis not present

## 2021-01-07 DIAGNOSIS — F606 Avoidant personality disorder: Secondary | ICD-10-CM | POA: Insufficient documentation

## 2021-01-07 DIAGNOSIS — R638 Other symptoms and signs concerning food and fluid intake: Secondary | ICD-10-CM | POA: Insufficient documentation

## 2021-01-07 DIAGNOSIS — R350 Frequency of micturition: Secondary | ICD-10-CM | POA: Insufficient documentation

## 2021-01-07 DIAGNOSIS — E039 Hypothyroidism, unspecified: Secondary | ICD-10-CM | POA: Insufficient documentation

## 2021-01-07 DIAGNOSIS — R52 Pain, unspecified: Secondary | ICD-10-CM

## 2021-01-07 DIAGNOSIS — R109 Unspecified abdominal pain: Secondary | ICD-10-CM | POA: Diagnosis present

## 2021-01-07 LAB — CBG MONITORING, ED: Glucose-Capillary: 115 mg/dL — ABNORMAL HIGH (ref 70–99)

## 2021-01-07 LAB — COMPREHENSIVE METABOLIC PANEL
ALT: 17 U/L (ref 0–44)
AST: 21 U/L (ref 15–41)
Albumin: 4.4 g/dL (ref 3.5–5.0)
Alkaline Phosphatase: 56 U/L (ref 38–126)
Anion gap: 9 (ref 5–15)
BUN: 14 mg/dL (ref 6–20)
CO2: 24 mmol/L (ref 22–32)
Calcium: 9.3 mg/dL (ref 8.9–10.3)
Chloride: 101 mmol/L (ref 98–111)
Creatinine, Ser: 0.73 mg/dL (ref 0.44–1.00)
GFR, Estimated: >60 mL/min (ref >60)
Glucose, Bld: 96 mg/dL (ref 70–99)
Potassium: 3.4 mmol/L — ABNORMAL LOW (ref 3.5–5.1)
Sodium: 134 mmol/L — ABNORMAL LOW (ref 135–145)
Total Bilirubin: 0.3 mg/dL (ref 0.3–1.2)
Total Protein: 9.4 g/dL — ABNORMAL HIGH (ref 6.5–8.1)

## 2021-01-07 LAB — URINALYSIS, ROUTINE W REFLEX MICROSCOPIC
Bilirubin Urine: NEGATIVE
Glucose, UA: NEGATIVE mg/dL
Hgb urine dipstick: NEGATIVE
Ketones, ur: NEGATIVE mg/dL
Leukocytes,Ua: NEGATIVE
Nitrite: NEGATIVE
Protein, ur: NEGATIVE mg/dL
Specific Gravity, Urine: 1.025 (ref 1.005–1.030)
pH: 6.5 (ref 5.0–8.0)

## 2021-01-07 LAB — CBC WITH DIFFERENTIAL/PLATELET
Abs Immature Granulocytes: 0.01 10*3/uL (ref 0.00–0.07)
Basophils Absolute: 0 10*3/uL (ref 0.0–0.1)
Basophils Relative: 0 %
Eosinophils Absolute: 0 10*3/uL (ref 0.0–0.5)
Eosinophils Relative: 1 %
HCT: 42.8 % (ref 36.0–46.0)
Hemoglobin: 13.8 g/dL (ref 12.0–15.0)
Immature Granulocytes: 0 %
Lymphocytes Relative: 35 %
Lymphs Abs: 2.2 10*3/uL (ref 0.7–4.0)
MCH: 28 pg (ref 26.0–34.0)
MCHC: 32.2 g/dL (ref 30.0–36.0)
MCV: 86.8 fL (ref 80.0–100.0)
Monocytes Absolute: 0.5 10*3/uL (ref 0.1–1.0)
Monocytes Relative: 7 %
Neutro Abs: 3.5 10*3/uL (ref 1.7–7.7)
Neutrophils Relative %: 57 %
Platelets: 245 10*3/uL (ref 150–400)
RBC: 4.93 MIL/uL (ref 3.87–5.11)
RDW: 14 % (ref 11.5–15.5)
WBC: 6.2 10*3/uL (ref 4.0–10.5)
nRBC: 0 % (ref 0.0–0.2)

## 2021-01-07 LAB — PREGNANCY, URINE: Preg Test, Ur: NEGATIVE

## 2021-01-07 LAB — LIPASE, BLOOD: Lipase: 40 U/L (ref 11–51)

## 2021-01-07 MED ORDER — PANTOPRAZOLE SODIUM 20 MG PO TBEC: 20.0000 mg | DELAYED_RELEASE_TABLET | Freq: Every day | ORAL | 0 refills | Status: DC

## 2021-01-07 MED ORDER — POTASSIUM CHLORIDE CRYS ER 20 MEQ PO TBCR
20.0000 meq | EXTENDED_RELEASE_TABLET | Freq: Once | ORAL | Status: AC
  Administered 2021-01-07: 20 meq via ORAL
  Filled 2021-01-07: qty 1

## 2021-01-07 MED ORDER — ALUM & MAG HYDROXIDE-SIMETH 200-200-20 MG/5ML PO SUSP
30.0000 mL | Freq: Once | ORAL | Status: AC
  Administered 2021-01-07: 30 mL via ORAL
  Filled 2021-01-07: qty 30

## 2021-01-07 NOTE — ED Provider Notes (Signed)
[facility] EMERGENCY DEPARTMENT Provider Note   CSN: [id number] Arrival date & time: 01/07/21  1640     History Chief Complaint  Patient presents with  . Abdominal Pain    States after eating she develops abdominal pain and nausea.  . Nausea    Julia Mccoy is a 45 y.o. female with a past medical history of anxiety, hypertension, obesity, who presents today for evaluation of feeling poorly after she eats.  She states that for the past 2 weeks after she eats something that is high in sugar she develops abdominal pain and nausea.  When asked her if it is possible that this is actually foods high in fat she says that she has not been paying attention to that and is unsure.  She states that in general she feels poorly however has difficulty describing it beyond that.  She states that she feels no pain during it but will dry heave and gag.  She denies syncopal events.  She reports no abdominal pain.  She notes that she has been having urinary frequency.  She denies any medication changes.  No diarrhea.  She states that she has only had the symptoms when she eats, has not had them if she doesn't eat.  She denies chest pains, however just notes she feels poorly and is unable to further clarify.    HPI     Past Medical History:  Diagnosis Date  . Anxiety   . Hypertension   . Hypothyroidism   . Obesity     Patient Active Problem List   Diagnosis Date Noted  . Hypothyroidism     Past Surgical History:  Procedure Laterality Date  . KELOID EXCISION       OB History   No obstetric history on file.     Family History  Problem Relation Age of Onset  . Hypertension Mother   . Hyperlipidemia Mother   . Hypertension Father   . Hyperlipidemia Father   . Diabetes Father   . Heart attack Cousin     Social History   Tobacco Use  . Smoking status: Never Smoker  . Smokeless tobacco: Never Used  Substance Use Topics  . Alcohol use: No  . Drug use: No    Home  Medications Prior to Admission medications   Medication Sig Start Date End Date Taking? Authorizing Provider  pantoprazole (PROTONIX) 20 MG tablet Take 1 tablet (20 mg total) by mouth daily. 01/07/21  Yes Cristina Gong, PA-C  amLODipine (NORVASC) 10 MG tablet Take 10 mg by mouth daily.    [provider]  busPIRone (BUSPAR) 10 MG tablet Take 10 mg by mouth 2 (two) times daily as needed. 11/27/19   [provider]  FLUoxetine (PROZAC) 20 MG capsule Take 20 mg by mouth daily. 11/27/19   [provider]  levothyroxine (SYNTHROID, LEVOTHROID) 88 MCG tablet Take 88 mcg by mouth daily.    [provider]  losartan-hydrochlorothiazide (HYZAAR) 50-12.5 MG per tablet Take 1 tablet by mouth daily.    [provider]  naproxen (NAPROSYN) 500 MG tablet Take 1 tablet (500 mg total) by mouth 2 (two) times daily. 09/08/20   Eber Hong, MD    Allergies    Patient has no known allergies.  Review of Systems   Review of Systems  Constitutional: Negative for chills and fever.  Respiratory: Negative for cough and shortness of breath.   Gastrointestinal: Positive for nausea. Negative for abdominal pain, constipation, diarrhea and vomiting.  Genitourinary: Positive for frequency.  Musculoskeletal: Negative for back pain.  Skin: Negative for color change and rash.  Neurological: Negative for weakness and headaches.  Psychiatric/Behavioral: The patient is nervous/anxious.   All other systems reviewed and are negative.   Physical Exam Updated Vital Signs BP (!) 141/72 (BP Location: Right Arm)   Pulse 66   Temp 98.2 F (36.8 C) (Oral)   Resp 18   Ht 5\' 11"  (1.803 m)   Wt (!) 156.5 kg   LMP 12/12/2020   SpO2 100%   BMI 48.12 kg/m   Physical Exam Vitals and nursing note reviewed.  Constitutional:      General: She is not in acute distress.    Appearance: She is not diaphoretic.  HENT:     Head: Normocephalic and atraumatic.  Eyes:     General:  No scleral icterus.       Right eye: No discharge.        Left eye: No discharge.     Conjunctiva/sclera: Conjunctivae normal.  Cardiovascular:     Rate and Rhythm: Normal rate and regular rhythm.  Pulmonary:     Effort: Pulmonary effort is normal. No respiratory distress.     Breath sounds: No stridor.  Abdominal:     General: Bowel sounds are normal. There is no distension.     Palpations: Abdomen is soft.     Tenderness: There is abdominal tenderness in the left upper quadrant.     Hernia: No hernia is present.  Musculoskeletal:        General: No deformity.     Cervical back: Normal range of motion.  Skin:    General: Skin is warm and dry.  Neurological:     Mental Status: She is alert.     Motor: No abnormal muscle tone.     Comments: Patient is awake and alert.  Speech is nonslurred.  She answers questions appropriately without difficulty.  Psychiatric:        Mood and Affect: Mood is anxious.        Behavior: Behavior normal.     ED Results / Procedures / Treatments   Labs (all labs ordered are listed, but only abnormal results are displayed) Labs Reviewed  COMPREHENSIVE METABOLIC PANEL - Abnormal; Notable for the following components:      Result Value   Sodium 134 (*)    Potassium 3.4 (*)    Total Protein 9.4 (*)    All other components within normal limits  CBG MONITORING, ED - Abnormal; Notable for the following components:   Glucose-Capillary 115 (*)    All other components within normal limits  LIPASE, BLOOD  CBC WITH DIFFERENTIAL/PLATELET  URINALYSIS, ROUTINE W REFLEX MICROSCOPIC  PREGNANCY, URINE    EKG EKG Interpretation  Date/Time:  Friday January 07 2021 19:07:21 EDT Ventricular Rate:  74 PR Interval:    QRS Duration: 82 QT Interval:  410 QTC Calculation: 455 R Axis:   76 Text Interpretation: Sinus rhythm ST elev, probable normal early repol pattern No significant change since prior 2/21 Confirmed by 3/21 [phone]) on 01/07/2021  7:09:13 PM   Radiology DG Chest 2 View  Result Date: 01/07/2021 CLINICAL DATA:  Intermittent abdominal and chest pain. EXAM: CHEST - 2 VIEW COMPARISON:  12/08/2019 FINDINGS: The heart size and mediastinal contours are within normal limits. Both lungs are clear. 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 IMPRESSION: No active cardiopulmonary abnormalities. Electronically Signed   By: 12/10/2019.D.  On: 01/07/2021 19:27    Procedures Procedures   Medications Ordered in ED Medications  alum & mag hydroxide-simeth (MAALOX/MYLANTA) 200-200-20 MG/5ML suspension 30 mL (30 mLs Oral Given 01/07/21 1904)  potassium chloride SA (KLOR-CON) CR tablet 20 mEq (20 mEq Oral Given 01/07/21 1904)    ED Course  I have reviewed the triage vital signs and the nursing notes.  Pertinent labs & imaging results that were available during my care of the patient were reviewed by me and considered in my medical decision making (see chart for details).  Clinical Course as of 01/07/21 2343  Caleen Essex Jan 07, 2021  2056 Patient is reevaluated, she has tolerated water's and crackers without difficulty and states she feels better after GI cocktail. [EH]    Clinical Course User Index [EH] Norman Clay   MDM Rules/Calculators/A&P                         Patient is a 45 year old woman who presents today for evaluation of nominal pain/feeling poorly after she eats.  She is unable to provide clear symptoms for what happens beyond that she feels poorly.  She has not had this symptom outside the setting of eating. She had been attributing this to eating sugary foods however I suspect this is actually related to eating fatty foods.  She was previously prediabetic and does not take any medications for diabetes. Here her CBG is normal.  I recommended that she follow-up with her primary care doctor for reevaluation of the A1c.  CBC, UA, CMP, lipase and pregnancy test are all unremarkable.   Given her difficulty in describing her symptoms I did obtain EKG and chest x-ray without cause for her symptoms found.  She was able to p.o. challenge with water and crackers after GI cocktail and felt better. I suspect that she has either gastritis/ulcers or is having symptomatic cholelithiasis.  She does not have evidence of cholecystitis as her abdomen is soft nontender, she does not have any right upper quadrant abdominal pain with reassuring labs. Recommended a bland, low-fat diet.  Recommended PPI and PCP follow-up for consideration of nonemergent right upper quadrant ultrasound to evaluate for possible gallstones.  On repeat exam patient does not have a surgical abdomen and is generally well-appearing with reassuring vitals.  Return precautions were discussed with patient who states their understanding.  At the time of discharge patient denied any unaddressed complaints or concerns.  Patient is agreeable for discharge home.  Note: Portions of this report may have been transcribed using voice recognition software. Every effort was made to ensure accuracy; however, inadvertent computerized transcription errors may be present  Final Clinical Impression(s) / ED Diagnoses Final diagnoses:  Pain aggravated by eating or drinking    Rx / DC Orders ED Discharge Orders         Ordered    pantoprazole (PROTONIX) 20 MG tablet  Daily        01/07/21 2101           Cristina Gong, [place] 01/07/21 2346    Terrilee Files, MD 01/08/21 [phone]

## 2021-01-07 NOTE — Discharge Instructions (Addendum)
As we discussed today I suspect that your symptoms may be caused by a combination of stomach irritation such as heartburn or stomach ulcers in addition to gallbladder related pain.  Your blood work today was very reassuring. Please start taking Protonix.  Take this every morning, spaced out from your thyroid medications.   If your insurance does not cover this or you prefer you may get Prilosec which is available over-the-counter.  Please make sure you are eating bland foods.  Your potassium was slightly low today.  Please try to eat a banana every day for the next few days to help with this. Additionally some of your pain may be caused by stones in your gallbladder that hurt when your gallbladder squeezes after eating a fatty meal.  I would recommend that you try to eat low-fat foods and follow-up with your primary care doctor.  If your symptoms worsen, you develop fevers, are unable to eat or drink, your pain becomes constant, starts happening when you are not eating or you have other concerns please seek additional medical care and evaluation.

## 2021-02-07 ENCOUNTER — Other Ambulatory Visit: Payer: Self-pay | Admitting: Obstetrics and Gynecology

## 2021-02-07 DIAGNOSIS — N6489 Other specified disorders of breast: Secondary | ICD-10-CM

## 2021-02-28 ENCOUNTER — Ambulatory Visit
Admission: RE | Admit: 2021-02-28 | Discharge: 2021-02-28 | Disposition: A | Discharge status: Home / Self Care | Payer: 59 | Source: Ambulatory Visit | Attending: Obstetrics and Gynecology | Admitting: Obstetrics and Gynecology

## 2021-02-28 ENCOUNTER — Other Ambulatory Visit: Payer: Self-pay

## 2021-02-28 ENCOUNTER — Ambulatory Visit: Payer: 59

## 2021-02-28 DIAGNOSIS — N6489 Other specified disorders of breast: Secondary | ICD-10-CM

## 2021-04-20 ENCOUNTER — Other Ambulatory Visit: Payer: Self-pay

## 2021-04-20 ENCOUNTER — Encounter (INDEPENDENT_AMBULATORY_CARE_PROVIDER_SITE_OTHER): Payer: Self-pay | Admitting: Bariatrics

## 2021-04-20 ENCOUNTER — Ambulatory Visit (INDEPENDENT_AMBULATORY_CARE_PROVIDER_SITE_OTHER): Payer: 59 | Admitting: Bariatrics

## 2021-04-20 VITALS — BP 137/82 | HR 72 | Temp 97.8°F | Ht 70.0 in | Wt 340.0 lb

## 2021-04-20 DIAGNOSIS — Z6841 Body Mass Index (BMI) 40.0 and over, adult: Secondary | ICD-10-CM

## 2021-04-20 DIAGNOSIS — Z1331 Encounter for screening for depression: Secondary | ICD-10-CM

## 2021-04-20 DIAGNOSIS — I10 Essential (primary) hypertension: Secondary | ICD-10-CM

## 2021-04-20 DIAGNOSIS — Z0289 Encounter for other administrative examinations: Secondary | ICD-10-CM

## 2021-04-20 DIAGNOSIS — E559 Vitamin D deficiency, unspecified: Secondary | ICD-10-CM

## 2021-04-20 DIAGNOSIS — Z9189 Other specified personal risk factors, not elsewhere classified: Secondary | ICD-10-CM | POA: Diagnosis not present

## 2021-04-20 DIAGNOSIS — R5383 Other fatigue: Secondary | ICD-10-CM | POA: Diagnosis not present

## 2021-04-20 DIAGNOSIS — R7303 Prediabetes: Secondary | ICD-10-CM

## 2021-04-20 DIAGNOSIS — G4739 Other sleep apnea: Secondary | ICD-10-CM

## 2021-04-20 DIAGNOSIS — R0602 Shortness of breath: Secondary | ICD-10-CM

## 2021-04-20 DIAGNOSIS — E038 Other specified hypothyroidism: Secondary | ICD-10-CM | POA: Diagnosis not present

## 2021-04-27 NOTE — Progress Notes (Signed)
Chief Complaint:   OBESITY Julia Mccoy (MR# [id number]) is a 45 y.o. female who presents for evaluation and treatment of obesity and related comorbidities. Current BMI is Body mass index is 48.78 kg/m. Julia Mccoy has been struggling with her weight for many years and has been unsuccessful in either losing weight, maintaining weight loss, or reaching her healthy weight goal.  Julia Mccoy does not necessarily like to cook secondary to fatigue. She states that she craves "everything", and she skips meals.  Julia Mccoy is currently in the action stage of change and ready to dedicate time achieving and maintaining a healthier weight. Julia Mccoy is interested in becoming our patient and working on intensive lifestyle modifications including (but not limited to) diet and exercise for weight loss.  Julia Mccoy's habits were reviewed today and are as follows: she thinks her family will eat healthier with her, her desired weight loss is 80 lbs, she has been heavy most of her life, she started gaining weight in high school, her heaviest weight ever was 361 pounds, she has significant food cravings issues, she skips meals frequently, she is trying to follow a vegetarian diet, she is frequently drinking liquids with calories, she frequently makes poor food choices, she has problems with excessive hunger, she frequently eats larger portions than normal, and she struggles with emotional eating.  Depression Screen Julia Mccoy's Food and Mood (modified PHQ-9) score was 16.  Depression screen PHQ 2/9 04/20/2021  Decreased Interest 2  Down, Depressed, Hopeless 2  PHQ - 2 Score 4  Altered sleeping 2  Tired, decreased energy 2  Change in appetite 2  Feeling bad or failure about yourself  2  Trouble concentrating 3  Moving slowly or fidgety/restless 0  Suicidal thoughts 1  PHQ-9 Score 16  Difficult doing work/chores Somewhat difficult   Subjective:   1. Other fatigue Julia Mccoy admits to daytime somnolence and admits to  waking up still tired. Patent has a history of symptoms of daytime fatigue and morning headache. Julia Mccoy generally gets 6 or 7 hours of sleep per night, and states that she has nightime awakenings. Snoring is present. Apneic episodes are present. Epworth Sleepiness Score is 13.  2. SOB (shortness of breath) on exertion Julia Mccoy notes increasing shortness of breath with exercising and seems to be worsening over time with weight gain. She notes getting out of breath sooner with activity than she used to. This has not gotten worse recently. Julia Mccoy denies shortness of breath at rest or orthopnea.  3. Essential hypertension Julia Mccoy is taking losartan and amlodipine.  4. Other specified hypothyroidism Julia Mccoy is taking levothyroxine, and she notes fatigue.  5. Other sleep apnea Julia Mccoy wears her CPAP.  6. Pre-diabetes Julia Mccoy is not on medications.  7. Vitamin D deficiency Julia Mccoy is taking multivitamins.  8. At risk for activity intolerance Julia Mccoy is at risk for exercise intolerance due to obesity, fatigue, and back and joint pain.  Assessment/Plan:   1. Other fatigue Julia Mccoy does feel that her weight is causing her energy to be lower than it should be. Fatigue may be related to obesity, depression or many other causes. Labs will be ordered, and in the meanwhile, Julia Mccoy will focus on self care including making healthy food choices, increasing physical activity and focusing on stress reduction.  - EKG 12-Lead - Comprehensive metabolic panel - Hemoglobin A1c - Insulin, random - Lipid Panel With LDL/HDL Ratio - T3 - T4, free - TSH - VITAMIN D 25 Hydroxy (Vit-D Deficiency, Fractures)  2. SOB (shortness  of breath) on exertion Julia Mccoy does feel that she gets out of breath more easily that she used to when she exercises. Julia Mccoy's shortness of breath appears to be obesity related and exercise induced. She has agreed to work on weight loss and gradually increase exercise to treat her  exercise induced shortness of breath. Will continue to monitor closely.  - Lipid Panel With LDL/HDL Ratio  3. Essential hypertension Julia Mccoy will continue her medications, and will continue working on healthy weight loss and exercise to improve blood pressure control. We will check labs today, and we will watch for signs of hypotension as she continues her lifestyle modifications.  - Comprehensive metabolic panel - Lipid Panel With LDL/HDL Ratio  4. Other specified hypothyroidism Julia Mccoy will continue her medications, and we will check labs today. Orders and follow up as documented in patient record.  Counseling Good thyroid control is important for overall health. Supratherapeutic thyroid levels are dangerous and will not improve weight loss results. Counseling: The correct way to take levothyroxine is fasting, with water, separated by at least 30 minutes from breakfast, and separated by more than 4 hours from calcium, iron, multivitamins, acid reflux medications (PPIs).   - T3 - T4, free - TSH  5. Other sleep apnea Intensive lifestyle modifications are the first line treatment for this issue. We discussed several lifestyle modifications today. Julia Mccoy will continue her CPAP daily, and will continue to work on diet, exercise and weight loss efforts. We will continue to monitor. Orders and follow up as documented in patient record.   6. Pre-diabetes Julia Mccoy will continue to work on weight loss, exercise, increasing healthy fats and protein, and decreasing simple carbohydrates to help decrease the risk of diabetes. We will check labs today.  - Hemoglobin A1c - Insulin, random  7. Vitamin D deficiency Low Vitamin D level contributes to fatigue and are associated with obesity, breast, and colon cancer. We will check labs today. Julia Mccoy will follow-up for routine testing of Vitamin D, at least 2-3 times per year to avoid over-replacement.  - VITAMIN D 25 Hydroxy (Vit-D Deficiency,  Fractures)  8. Depression screen Julia Mccoy had a positive depression screening. Depression is commonly associated with obesity and often results in emotional eating behaviors. We will monitor this closely and work on CBT to help improve the non-hunger eating patterns. Referral to Psychology may be required if no improvement is seen as she continues in our clinic.  9. At risk for activity intolerance Julia Mccoy was given approximately 15 minutes of exercise intolerance counseling today. She is 45 y.o. female and has risk factors exercise intolerance including obesity. We discussed intensive lifestyle modifications today with an emphasis on specific weight loss instructions and strategies. Julia Mccoy will slowly increase activity as tolerated.  Repetitive spaced learning was employed today to elicit superior memory formation and behavioral change.  10. Class 3 severe obesity with serious comorbidity and body mass index (BMI) of 45.0 to 49.9 in adult, unspecified obesity type (HCC) Julia Mccoy is currently in the action stage of change and her goal is to continue with weight loss efforts. I recommend Julia Mccoy begin the structured treatment plan as follows:  She has agreed to the Category 2 Plan.  I reviewed labs from 01/07/2021 with the patient today.   Exercise goals: No exercise has been prescribed at this time.   Behavioral modification strategies: increasing lean protein intake, decreasing simple carbohydrates, increasing vegetables, increasing water intake, decreasing eating out, no skipping meals, meal planning and cooking strategies, keeping healthy foods  in the home, and planning for success.  She was informed of the importance of frequent follow-up visits to maximize her success with intensive lifestyle modifications for her multiple health conditions. She was informed we would discuss her lab results at her next visit unless there is a critical issue that needs to be addressed sooner. Takiya agreed to  keep her next visit at the agreed upon time to discuss these results.  Objective:   Blood pressure 137/82, pulse 72, temperature 97.8 F (36.6 C), height 5\' 10"  (1.778 m), weight (!) 340 lb (154.2 kg), last menstrual period 04/11/2021, SpO2 98 %. Body mass index is 48.78 kg/m.  EKG: Normal sinus rhythm, rate 68 BPM.  Indirect Calorimeter completed today shows a VO2 of 253 and a REE of 1753.  Her calculated basal metabolic rate is 04/13/2021 thus her basal metabolic rate is worse than expected.  General: Cooperative, alert, well developed, in no acute distress. HEENT: Conjunctivae and lids unremarkable. Cardiovascular: Regular rhythm.  Lungs: Normal work of breathing. Neurologic: No focal deficits.   Lab Results  Component Value Date   CREATININE 0.73 01/07/2021   BUN 14 01/07/2021   NA 134 (L) 01/07/2021   K 3.4 (L) 01/07/2021   CL 101 01/07/2021   CO2 24 01/07/2021   Lab Results  Component Value Date   ALT 17 01/07/2021   AST 21 01/07/2021   ALKPHOS 56 01/07/2021   BILITOT 0.3 01/07/2021   No results found for: HGBA1C No results found for: INSULIN Lab Results  Component Value Date   TSH 2.199 12/08/2019   No results found for: CHOL, HDL, LDLCALC, LDLDIRECT, TRIG, CHOLHDL Lab Results  Component Value Date   WBC 6.2 01/07/2021   HGB 13.8 01/07/2021   HCT 42.8 01/07/2021   MCV 86.8 01/07/2021   PLT 245 01/07/2021   No results found for: IRON, TIBC, FERRITIN  Attestation Statements:   Reviewed by clinician on day of visit: allergies, medications, problem list, medical history, surgical history, family history, social history, and previous encounter notes.   01/09/2021, am acting as Trude Mcburney for Energy manager, DO.  I have reviewed the above documentation for accuracy and completeness, and I agree with the above. [employer], DO

## 2021-04-28 ENCOUNTER — Encounter (INDEPENDENT_AMBULATORY_CARE_PROVIDER_SITE_OTHER): Payer: Self-pay | Admitting: Bariatrics

## 2021-04-28 DIAGNOSIS — I10 Essential (primary) hypertension: Secondary | ICD-10-CM | POA: Insufficient documentation

## 2021-04-28 DIAGNOSIS — Z6841 Body Mass Index (BMI) 40.0 and over, adult: Secondary | ICD-10-CM | POA: Insufficient documentation

## 2021-04-28 DIAGNOSIS — R87619 Unspecified abnormal cytological findings in specimens from cervix uteri: Secondary | ICD-10-CM | POA: Insufficient documentation

## 2021-05-04 LAB — COMPREHENSIVE METABOLIC PANEL
ALT: 13 IU/L (ref 0–32)
AST: 16 IU/L (ref 0–40)
Albumin/Globulin Ratio: 1.2 (ref 1.2–2.2)
Albumin: 4.2 g/dL (ref 3.8–4.8)
Alkaline Phosphatase: 60 IU/L (ref 44–121)
BUN/Creatinine Ratio: 19 (ref 9–23)
BUN: 13 mg/dL (ref 6–24)
Bilirubin Total: 0.4 mg/dL (ref 0.0–1.2)
CO2: 19 mmol/L — ABNORMAL LOW (ref 20–29)
Calcium: 8.8 mg/dL (ref 8.7–10.2)
Chloride: 101 mmol/L (ref 96–106)
Creatinine, Ser: 0.7 mg/dL (ref 0.57–1.00)
Globulin, Total: 3.4 g/dL (ref 1.5–4.5)
Glucose: 99 mg/dL (ref 65–99)
Potassium: 3.9 mmol/L (ref 3.5–5.2)
Sodium: 135 mmol/L (ref 134–144)
Total Protein: 7.6 g/dL (ref 6.0–8.5)
eGFR: 109 mL/min/{1.73_m2} (ref >59)

## 2021-05-04 LAB — LIPID PANEL WITH LDL/HDL RATIO
Cholesterol, Total: 159 mg/dL (ref 100–199)
HDL: 37 mg/dL — ABNORMAL LOW (ref >39)
LDL Chol Calc (NIH): 108 mg/dL — ABNORMAL HIGH (ref 0–99)
LDL/HDL Ratio: 2.9 ratio (ref 0.0–3.2)
Triglycerides: 69 mg/dL (ref 0–149)
VLDL Cholesterol Cal: 14 mg/dL (ref 5–40)

## 2021-05-04 LAB — T4, FREE: Free T4: 1.05 ng/dL (ref 0.82–1.77)

## 2021-05-04 LAB — HEMOGLOBIN A1C
Est. average glucose Bld gHb Est-mCnc: 114 mg/dL
Hgb A1c MFr Bld: 5.6 % (ref 4.8–5.6)

## 2021-05-04 LAB — INSULIN, RANDOM: INSULIN: 22.4 u[IU]/mL (ref 2.6–24.9)

## 2021-05-04 LAB — VITAMIN D 25 HYDROXY (VIT D DEFICIENCY, FRACTURES): Vit D, 25-Hydroxy: 25.8 ng/mL — ABNORMAL LOW (ref 30.0–100.0)

## 2021-05-04 LAB — TSH: TSH: 2.86 u[IU]/mL (ref 0.450–4.500)

## 2021-05-04 LAB — T3: T3, Total: 110 ng/dL (ref 71–180)

## 2021-05-05 ENCOUNTER — Encounter (INDEPENDENT_AMBULATORY_CARE_PROVIDER_SITE_OTHER): Payer: Self-pay | Admitting: Bariatrics

## 2021-05-05 ENCOUNTER — Ambulatory Visit (INDEPENDENT_AMBULATORY_CARE_PROVIDER_SITE_OTHER): Payer: 59 | Admitting: Bariatrics

## 2021-05-05 ENCOUNTER — Other Ambulatory Visit: Payer: Self-pay

## 2021-05-05 VITALS — BP 131/94 | HR 63 | Temp 98.1°F | Ht 70.0 in | Wt 335.0 lb

## 2021-05-05 DIAGNOSIS — E559 Vitamin D deficiency, unspecified: Secondary | ICD-10-CM

## 2021-05-05 DIAGNOSIS — Z6841 Body Mass Index (BMI) 40.0 and over, adult: Secondary | ICD-10-CM

## 2021-05-05 DIAGNOSIS — K5909 Other constipation: Secondary | ICD-10-CM | POA: Diagnosis not present

## 2021-05-05 DIAGNOSIS — R7303 Prediabetes: Secondary | ICD-10-CM

## 2021-05-05 DIAGNOSIS — I10 Essential (primary) hypertension: Secondary | ICD-10-CM | POA: Diagnosis not present

## 2021-05-05 DIAGNOSIS — Z9189 Other specified personal risk factors, not elsewhere classified: Secondary | ICD-10-CM

## 2021-05-05 DIAGNOSIS — E66813 Obesity, class 3: Secondary | ICD-10-CM

## 2021-05-05 MED ORDER — VITAMIN D (ERGOCALCIFEROL) 1.25 MG (50000 UNIT) PO CAPS: 50000.0000 [IU] | ORAL_CAPSULE | ORAL | 0 refills | Status: DC

## 2021-05-07 ENCOUNTER — Other Ambulatory Visit: Payer: Self-pay

## 2021-05-07 ENCOUNTER — Emergency Department (HOSPITAL_BASED_OUTPATIENT_CLINIC_OR_DEPARTMENT_OTHER)
Admission: EM | Admit: 2021-05-07 | Discharge: 2021-05-07 | Disposition: A | Discharge status: Home / Self Care | Payer: 59 | Attending: Emergency Medicine | Admitting: Emergency Medicine

## 2021-05-07 ENCOUNTER — Encounter (HOSPITAL_BASED_OUTPATIENT_CLINIC_OR_DEPARTMENT_OTHER): Payer: Self-pay | Admitting: Emergency Medicine

## 2021-05-07 DIAGNOSIS — E039 Hypothyroidism, unspecified: Secondary | ICD-10-CM | POA: Insufficient documentation

## 2021-05-07 DIAGNOSIS — R109 Unspecified abdominal pain: Secondary | ICD-10-CM | POA: Diagnosis present

## 2021-05-07 DIAGNOSIS — K219 Gastro-esophageal reflux disease without esophagitis: Secondary | ICD-10-CM | POA: Diagnosis not present

## 2021-05-07 DIAGNOSIS — I10 Essential (primary) hypertension: Secondary | ICD-10-CM | POA: Diagnosis not present

## 2021-05-07 DIAGNOSIS — K5900 Constipation, unspecified: Secondary | ICD-10-CM | POA: Insufficient documentation

## 2021-05-07 MED ORDER — SENNOSIDES-DOCUSATE SODIUM 8.6-50 MG PO TABS: 1.0000 | ORAL_TABLET | Freq: Every day | ORAL | 0 refills | Status: AC

## 2021-05-07 NOTE — ED Triage Notes (Signed)
Pt c/o abd pain and constipation

## 2021-05-07 NOTE — ED Provider Notes (Signed)
[facility] Emergency Department Provider Note MRN:  [id number]  Arrival date & time: 05/07/21     Chief Complaint   Abdominal Pain   History of Present Illness   Julia Mccoy is a 45 y.o. year-old female with a history of hypertension presenting to the ED with chief complaint of constipation.  For 5 days of little to no bowel movements, feeling some abdominal bloating, wants help with constipation.  Denies any fever, no history of abdominal surgeries.  No chest pain or shortness of breath but does endorse some left shoulder pain 2 or 3 days ago.  Review of Systems  A complete 10 system review of systems was obtained and all systems are negative except as noted in the HPI and PMH.   Patient's Health History    Past Medical History:  Diagnosis Date   Anxiety    Back pain    Chest pain    Constipation    Depression    Edema of both lower extremities    GERD (gastroesophageal reflux disease)    Hypertension    Hypothyroidism    Joint pain    Obesity    Palpitations    Pre-diabetes    Sleep apnea     Past Surgical History:  Procedure Laterality Date   KELOID EXCISION      Family History  Problem Relation Age of Onset   Hypertension Mother    Hyperlipidemia Mother    Depression Mother    Hypertension Father    Hyperlipidemia Father    Diabetes Father    Cancer Father    Heart attack Cousin     Social History   Socioeconomic History   Marital status: Single    Spouse name: Not on file   Number of children: Not on file   Years of education: Not on file   Highest education level: Not on file  Occupational History   Occupation: Therapist Mental Health  Tobacco Use   Smoking status: Never   Smokeless tobacco: Never  Substance and Sexual Activity   Alcohol use: No   Drug use: No   Sexual activity: Not on file  Other Topics Concern   Not on file  Social History Narrative   Not on file   Social Determinants of Health    Financial Resource Strain: Not on file  Food Insecurity: Not on file  Transportation Needs: Not on file  Physical Activity: Not on file  Stress: Not on file  Social Connections: Not on file  Intimate Partner Violence: Not on file     Physical Exam   Vitals:   05/07/21 0324  BP: (!) 146/99  Pulse: 80  Resp: 16  Temp: 98.5 F (36.9 C)  SpO2: 99%    CONSTITUTIONAL: Well-appearing, NAD NEURO:  Alert and oriented x 3, no focal deficits EYES:  eyes equal and reactive ENT/NECK:  no LAD, no JVD CARDIO: Regular rate, well-perfused, normal S1 and S2 PULM:  CTAB no wheezing or rhonchi GI/GU:  normal bowel sounds, non-distended, non-tender MSK/SPINE:  No gross deformities, no edema SKIN:  no rash, atraumatic PSYCH:  Appropriate speech and behavior  *Additional and/or pertinent findings included in MDM below  Diagnostic and Interventional Summary    EKG Interpretation  Date/Time:  Saturday May 07 2021 03:59:08 EDT Ventricular Rate:  65 PR Interval:  189 QRS Duration: 82 QT Interval:  419 QTC Calculation: 436 R Axis:   80 Text Interpretation: Sinus rhythm ST elev, probable normal  early repol pattern No significant change was found Confirmed by Kennis Carina [phone]) on 05/07/2021 4:09:33 AM       Labs Reviewed - No data to display  No orders to display    Medications - No data to display   Procedures  /  Critical Care Procedures  ED Course and Medical Decision Making  I have reviewed the triage vital signs, the nursing notes, and pertinent available records from the EMR.  Listed above are laboratory and imaging tests that I personally ordered, reviewed, and interpreted and then considered in my medical decision making (see below for details).  Patient here mostly for constipation.  Abdomen is soft and nontender, she is well-appearing, normal vital signs.  We discussed management options and decided to trial increasing bowel regimen at home rather than laboratory  testing or imaging today.  Will return if symptoms worsen or do not improve.  Endorsing some left shoulder pain few days ago that radiated into the chest, seems musculoskeletal.  Screening EKG is unchanged, appropriate for discharge.       Elmer Sow. Pilar Plate, MD [facility] Emergency Medicine [facility] [email or web address]  Final Clinical Impressions(s) / ED Diagnoses     ICD-10-CM   1. Constipation, unspecified constipation type  K59.00       ED Discharge Orders          Ordered    senna-docusate (SENOKOT-S) 8.6-50 MG tablet  Daily        05/07/21 0410             Discharge Instructions Discussed with and Provided to Patient:    Discharge Instructions      You were evaluated in the Emergency Department and after careful evaluation, we did not find any emergent condition requiring admission or further testing in the hospital.  Your exam/testing today is overall reassuring.  Symptoms seem to be due to constipation.  Recommend increasing your daily MiraLAX, can take it up to 6 times per day until you achieve soft frequent stools.  You can also use the senna medication provided  Please return to the Emergency Department if you experience any worsening of your condition.   Thank you for allowing [place] to be a part of your care.       Sabas Sous, MD 05/07/21 [phone]

## 2021-05-07 NOTE — Discharge Instructions (Addendum)
You were evaluated in the Emergency Department and after careful evaluation, we did not find any emergent condition requiring admission or further testing in the hospital.  Your exam/testing today is overall reassuring.  Symptoms seem to be due to constipation.  Recommend increasing your daily MiraLAX, can take it up to 6 times per day until you achieve soft frequent stools.  You can also use the senna medication provided  Please return to the Emergency Department if you experience any worsening of your condition.   Thank you for allowing [place] to be a part of your care.

## 2021-05-12 NOTE — Progress Notes (Signed)
Chief Complaint:   OBESITY Julia Mccoy is here to discuss her progress with her obesity treatment plan along with follow-up of her obesity related diagnoses. Julia Mccoy is on the Category 1 Plan and the Category 2 Plan and states she is following her eating plan approximately 80% of the time. Julia Mccoy states she is is walking 15 to 20 minutes 5 times per week.  Today's visit was #: 2 Starting weight: 340 lbs Starting date: 04/20/21 Today's weight: 335 lbs Today's date: 05/05/2021 Total lbs lost to date: 5 Total lbs lost since last in-office visit: 5  Interim History: Julia Mccoy is down 5 pounds from her initial visit. It was hard to follow the plan at time. She ate out less.  Subjective:   1. Essential hypertension Julia Mccoy is taking Norvasc and Hyzaar.  BP Readings from Last 3 Encounters:  05/07/21 (!) 150/99  05/05/21 (!) 131/94  04/20/21 137/82   Lab Results  Component Value Date   CREATININE 0.70 05/03/2021   CREATININE 0.73 01/07/2021   CREATININE 0.84 02/03/2020    2. Pre-diabetes Julia Mccoy has a diagnosis of prediabetes based on her elevated HgA1c of 5.6 and her insulin of 22.4. She is not on medication. She was informed this puts her at greater risk of developing diabetes. She continues to work on diet and exercise to decrease her risk of diabetes. She denies nausea or hypoglycemia.  Lab Results  Component Value Date   HGBA1C 5.6 05/03/2021   Lab Results  Component Value Date   INSULIN 22.4 05/03/2021   3. Vitamin D insufficiency She is currently taking no vitamin D supplement. Her vitamin D level was 25.8. She denies nausea, vomiting or muscle weakness.  Lab Results  Component Value Date   VD25OH 25.8 (L) 05/03/2021    4. Other constipation Julia Mccoy notes constipation, but she is not taking anything.  5. At risk for osteoporosis Julia Mccoy is at risk for osteoporosis due to vitamin D deficiency.  Assessment/Plan:   1. Essential hypertension Julia Mccoy agrees to  continue taking her medications. She is working on healthy weight loss and exercise to improve blood pressure control. We will watch for signs of hypotension as she continues her lifestyle modifications.  2. Pre-diabetes Julia Mccoy agrees to decrease carbohydrates and to increase healthy fats and proteins. A handout was given for insulin resistance.She will continue to work on weight loss, exercise, and decreasing simple carbohydrates to help decrease the risk of diabetes.   3. Vitamin D insufficiency Low Vitamin D level contributes to fatigue and are associated with obesity, breast, and colon cancer. She agrees to continue to take prescription Vitamin D @50 ,000 IU every week # 4  with no refills and will follow-up for routine testing of Vitamin D, at least 2-3 times per year to avoid over-replacement.  4. Other constipation Julia Mccoy will begin Miralax with a full glass of water. She was informed that a decrease in bowel movement frequency is normal while losing weight, but stools should not be hard or painful. Orders and follow up as documented in patient record.   Counseling Getting to Good Bowel Health: Your goal is to have one soft bowel movement each day. Drink at least 8 glasses of water each day. Eat plenty of fiber (goal is over 25 grams each day). It is best to get most of your fiber from dietary sources which includes leafy green vegetables, fresh fruit, and whole grains. You may need to add fiber with the help of OTC fiber supplements. These include  Metamucil, Citrucel, and Flaxseed. If you are still having trouble, try adding Miralax or Magnesium Citrate. If all of these changes do not work, Dietitian.  5. At risk for osteoporosis Low Vitamin D level contributes to fatigue and are associated with obesity, breast, and colon cancer. She agrees to continue to take prescription Vitamin D @50 ,000 IU every week and will follow-up for routine testing of Vitamin D, at least 2-3 times per  year to avoid over-replacement.   6. Obesity, current BMI 48.1 Julia Mccoy agrees to meal planning and will adhere closer to the plan. Labs done 05/03/21 were a CMP, vit D, A1c and thyroid panel. She also agrees to increase her water intake.  Julia Mccoy is currently in the action stage of change. As such, her goal is to continue with weight loss efforts. She has agreed to the Category 2 Plan.   Exercise goals: No exercise has been prescribed at this time.  Behavioral modification strategies: increasing lean protein intake, decreasing simple carbohydrates, increasing vegetables, increasing water intake, decreasing eating out, no skipping meals, meal planning and cooking strategies, keeping healthy foods in the home, and planning for success.  Julia Mccoy has agreed to follow-up with our clinic in 2 weeks. She was informed of the importance of frequent follow-up visits to maximize her success with intensive lifestyle modifications for her multiple health conditions.   Objective:   Blood pressure (!) 131/94, pulse 63, temperature 98.1 F (36.7 C), height 5\' 10"  (1.778 m), weight (!) 335 lb (152 kg), last menstrual period 04/11/2021, SpO2 98 %. Body mass index is 48.07 kg/m.  General: Cooperative, alert, well developed, in no acute distress. HEENT: Conjunctivae and lids unremarkable. Cardiovascular: Regular rhythm.  Lungs: Normal work of breathing. Neurologic: No focal deficits.   Lab Results  Component Value Date   CREATININE 0.70 05/03/2021   BUN 13 05/03/2021   NA 135 05/03/2021   K 3.9 05/03/2021   CL 101 05/03/2021   CO2 19 (L) 05/03/2021   Lab Results  Component Value Date   ALT 13 05/03/2021   AST 16 05/03/2021   ALKPHOS 60 05/03/2021   BILITOT 0.4 05/03/2021   Lab Results  Component Value Date   HGBA1C 5.6 05/03/2021   Lab Results  Component Value Date   INSULIN 22.4 05/03/2021   Lab Results  Component Value Date   TSH 2.860 05/03/2021   Lab Results  Component Value  Date   CHOL 159 05/03/2021   HDL 37 (L) 05/03/2021   LDLCALC 108 (H) 05/03/2021   TRIG 69 05/03/2021   Lab Results  Component Value Date   VD25OH 25.8 (L) 05/03/2021   Lab Results  Component Value Date   WBC 6.2 01/07/2021   HGB 13.8 01/07/2021   HCT 42.8 01/07/2021   MCV 86.8 01/07/2021   PLT 245 01/07/2021   No results found for: IRON, TIBC, FERRITIN  Attestation Statements:   Reviewed by clinician on day of visit: allergies, medications, problem list, medical history, surgical history, family history, social history, and previous encounter notes.  I03/27/2022, CMA, am acting as transcriptionist for 01/09/2021, DO   I have reviewed the above documentation for accuracy and completeness, and I agree with the above. Kirke Corin, DO

## 2021-05-13 ENCOUNTER — Encounter (INDEPENDENT_AMBULATORY_CARE_PROVIDER_SITE_OTHER): Payer: Self-pay | Admitting: Bariatrics

## 2021-05-26 ENCOUNTER — Ambulatory Visit (INDEPENDENT_AMBULATORY_CARE_PROVIDER_SITE_OTHER): Payer: 59 | Admitting: Bariatrics

## 2021-05-26 ENCOUNTER — Other Ambulatory Visit: Payer: Self-pay

## 2021-05-26 ENCOUNTER — Encounter (INDEPENDENT_AMBULATORY_CARE_PROVIDER_SITE_OTHER): Payer: Self-pay | Admitting: Bariatrics

## 2021-05-26 VITALS — BP 136/84 | HR 90 | Temp 98.0°F | Ht 70.0 in | Wt 328.0 lb

## 2021-05-26 DIAGNOSIS — Z6841 Body Mass Index (BMI) 40.0 and over, adult: Secondary | ICD-10-CM | POA: Diagnosis not present

## 2021-05-26 DIAGNOSIS — Z9189 Other specified personal risk factors, not elsewhere classified: Secondary | ICD-10-CM

## 2021-05-26 DIAGNOSIS — R7303 Prediabetes: Secondary | ICD-10-CM | POA: Diagnosis not present

## 2021-05-26 DIAGNOSIS — E559 Vitamin D deficiency, unspecified: Secondary | ICD-10-CM

## 2021-05-26 MED ORDER — VITAMIN D (ERGOCALCIFEROL) 1.25 MG (50000 UNIT) PO CAPS: 50000.0000 [IU] | ORAL_CAPSULE | ORAL | 0 refills | Status: DC

## 2021-05-30 NOTE — Progress Notes (Signed)
Chief Complaint:   OBESITY Julia Mccoy is here to discuss her progress with her obesity treatment plan along with follow-up of her obesity related diagnoses. Zelena is on the Category 2 Plan and states she is following her eating plan approximately 60-70% of the time. Seleena states she is walking 15-20 minutes 4-5 times per week.  Today's visit was #: 3 Starting weight: 340 lbs Starting date: 04/20/2021 Today's weight: 328 lbs Today's date: 05/26/2021 Total lbs lost to date: 12 lbs Total lbs lost since last in-office visit: 7 lbs  Interim History: Bobbi is down another 7 lbs and doing well overall. She has "cut out the sugar".  Subjective:   1. Vitamin D insufficiency She is currently taking prescription vitamin D 50,000 IU each week. She denies nausea, vomiting or muscle weakness.  Lab Results  Component Value Date   VD25OH 25.8 (L) 05/03/2021   2. Pre-diabetes Kaylia has a diagnosis of prediabetes based on her elevated HgA1c and was informed this puts her at greater risk of developing diabetes. She continues to work on diet and exercise to decrease her risk of diabetes. She denies nausea or hypoglycemia. She is not currently taking any medications.  Lab Results  Component Value Date   HGBA1C 5.6 05/03/2021   Lab Results  Component Value Date   INSULIN 22.4 05/03/2021   3. At risk for diabetes mellitus Shraddha is at higher than average risk for developing diabetes due to obesity.    Assessment/Plan:   1. Vitamin D insufficiency Low Vitamin D level contributes to fatigue and are associated with obesity, breast, and colon cancer. She agrees to continue to take prescription Vitamin D @50 ,000 IU every week and will follow-up for routine testing of Vitamin D, at least 2-3 times per year to avoid over-replacement.  - Vitamin D, Ergocalciferol, (DRISDOL) 1.25 MG (50000 UNIT) CAPS capsule; Take 1 capsule (50,000 Units total) by mouth every 7 (seven) days.  Dispense: 4  capsule; Refill: 0  2. Pre-diabetes Marlea will continue to work on weight loss, exercise, and decreasing simple carbohydrates to help decrease the risk of diabetes.  She will decrease carbohydrates. She will continue walking and being active.  3. At risk for diabetes mellitus Louine was given approximately 15 minutes of diabetes education and counseling today. We discussed intensive lifestyle modifications today with an emphasis on weight loss as well as increasing exercise and decreasing simple carbohydrates in her diet. We also reviewed medication options with an emphasis on risk versus benefit of those discussed.   Repetitive spaced learning was employed today to elicit superior memory formation and behavioral change.   4. Obesity, current BMI 47.1 Mandi is currently in the action stage of change. As such, her goal is to continue with weight loss efforts. She has agreed to the Category 2 Plan.   Exercise goals: Continue and increase walking.  Behavioral modification strategies: increasing lean protein intake, decreasing simple carbohydrates, increasing vegetables, increasing water intake, meal planning and cooking strategies, keeping healthy foods in the home, ways to avoid boredom eating, ways to avoid night time snacking, and planning for success.  Stephnie has agreed to follow-up with our clinic in 2 weeks. She was informed of the importance of frequent follow-up visits to maximize her success with intensive lifestyle modifications for her multiple health conditions.   Objective:   Blood pressure 136/84, pulse 90, temperature 98 F (36.7 C), height 5\' 10"  (1.778 m), weight (!) 328 lb (148.8 kg), SpO2 95 %. Body mass  index is 47.06 kg/m.  General: Cooperative, alert, well developed, in no acute distress. HEENT: Conjunctivae and lids unremarkable. Cardiovascular: Regular rhythm.  Lungs: Normal work of breathing. Neurologic: No focal deficits.   Lab Results  Component Value  Date   CREATININE 0.70 05/03/2021   BUN 13 05/03/2021   NA 135 05/03/2021   K 3.9 05/03/2021   CL 101 05/03/2021   CO2 19 (L) 05/03/2021   Lab Results  Component Value Date   ALT 13 05/03/2021   AST 16 05/03/2021   ALKPHOS 60 05/03/2021   BILITOT 0.4 05/03/2021   Lab Results  Component Value Date   HGBA1C 5.6 05/03/2021   Lab Results  Component Value Date   INSULIN 22.4 05/03/2021   Lab Results  Component Value Date   TSH 2.860 05/03/2021   Lab Results  Component Value Date   CHOL 159 05/03/2021   HDL 37 (L) 05/03/2021   LDLCALC 108 (H) 05/03/2021   TRIG 69 05/03/2021   Lab Results  Component Value Date   VD25OH 25.8 (L) 05/03/2021   Lab Results  Component Value Date   WBC 6.2 01/07/2021   HGB 13.8 01/07/2021   HCT 42.8 01/07/2021   MCV 86.8 01/07/2021   PLT 245 01/07/2021   No results found for: IRON, TIBC, FERRITIN  Attestation Statements:   Reviewed by clinician on day of visit: allergies, medications, problem list, medical history, surgical history, family history, social history, and previous encounter notes.  IPaulla Fore, CMA, am acting as transcriptionist for Dr. Corinna Capra, DO.  I have reviewed the above documentation for accuracy and completeness, and I agree with the above. Corinna Capra, DO

## 2021-05-31 ENCOUNTER — Encounter (INDEPENDENT_AMBULATORY_CARE_PROVIDER_SITE_OTHER): Payer: Self-pay | Admitting: Bariatrics

## 2021-06-16 ENCOUNTER — Ambulatory Visit (INDEPENDENT_AMBULATORY_CARE_PROVIDER_SITE_OTHER): Payer: 59 | Admitting: Bariatrics

## 2021-06-28 ENCOUNTER — Encounter (HOSPITAL_BASED_OUTPATIENT_CLINIC_OR_DEPARTMENT_OTHER): Payer: Self-pay | Admitting: *Deleted

## 2021-06-28 ENCOUNTER — Other Ambulatory Visit: Payer: Self-pay

## 2021-06-28 ENCOUNTER — Emergency Department (HOSPITAL_BASED_OUTPATIENT_CLINIC_OR_DEPARTMENT_OTHER)
Admission: EM | Admit: 2021-06-28 | Discharge: 2021-06-28 | Disposition: A | Discharge status: Home / Self Care | Payer: 59 | Attending: Emergency Medicine | Admitting: Emergency Medicine

## 2021-06-28 DIAGNOSIS — I10 Essential (primary) hypertension: Secondary | ICD-10-CM | POA: Insufficient documentation

## 2021-06-28 DIAGNOSIS — Z79899 Other long term (current) drug therapy: Secondary | ICD-10-CM | POA: Insufficient documentation

## 2021-06-28 DIAGNOSIS — R519 Headache, unspecified: Secondary | ICD-10-CM | POA: Insufficient documentation

## 2021-06-28 DIAGNOSIS — R531 Weakness: Secondary | ICD-10-CM | POA: Diagnosis not present

## 2021-06-28 DIAGNOSIS — G43109 Migraine with aura, not intractable, without status migrainosus: Secondary | ICD-10-CM

## 2021-06-28 DIAGNOSIS — E039 Hypothyroidism, unspecified: Secondary | ICD-10-CM | POA: Diagnosis not present

## 2021-06-28 LAB — CBG MONITORING, ED: Glucose-Capillary: 100 mg/dL — ABNORMAL HIGH (ref 70–99)

## 2021-06-28 MED ORDER — METOCLOPRAMIDE HCL 5 MG/ML IJ SOLN
10.0000 mg | Freq: Once | INTRAMUSCULAR | Status: AC
  Administered 2021-06-28: 10 mg via INTRAVENOUS
  Filled 2021-06-28: qty 2

## 2021-06-28 MED ORDER — KETOROLAC TROMETHAMINE 30 MG/ML IJ SOLN
30.0000 mg | Freq: Once | INTRAMUSCULAR | Status: AC
  Administered 2021-06-28: 30 mg via INTRAVENOUS
  Filled 2021-06-28: qty 1

## 2021-06-28 NOTE — ED Provider Notes (Signed)
[facility] EMERGENCY DEPARTMENT Provider Note  CSN: [id number] Arrival date & time: 06/28/21 2057    History Chief Complaint  Patient presents with  . Headache    Julia Mccoy is a 45 y.o. female presents for evaluation of daily headaches for the last 2 weeks. Has had similar before but these have been persistent. She describes the headaches as unilateral throbbing but not always on the same side. She has 1-2 headaches per day. She saw her PCP a few days ago for routine visit, he startred her on Effexor then for the headaches but she read the side effects and did not start taking it. Earlier today she had a visual disturbance described as a bright light that lasted for a few minutes. This evening she was on her computer when headache began on the left side and she noted a brief period of having difficulty using her L hand.  No other reports of numbness or weakness. Hand weakness had resolved before arrival. Still having a moderate throbbing headache.    Past Medical History:  Diagnosis Date  . Anxiety   . Back pain   . Chest pain   . Constipation   . Depression   . Edema of both lower extremities   . GERD (gastroesophageal reflux disease)   . Hypertension   . Hypothyroidism   . Joint pain   . Obesity   . Palpitations   . Pre-diabetes   . Sleep apnea     Past Surgical History:  Procedure Laterality Date  . KELOID EXCISION      Family History  Problem Relation Age of Onset  . Hypertension Mother   . Hyperlipidemia Mother   . Depression Mother   . Hypertension Father   . Hyperlipidemia Father   . Diabetes Father   . Cancer Father   . Heart attack Cousin     Social History   Tobacco Use  . Smoking status: Never  . Smokeless tobacco: Never  Substance Use Topics  . Alcohol use: No  . Drug use: No     Home Medications Prior to Admission medications   Medication Sig Start Date End Date Taking? Authorizing Provider  amLODipine (NORVASC) 10 MG  tablet Take 10 mg by mouth daily.    [provider]  busPIRone (BUSPAR) 10 MG tablet Take 10 mg by mouth 2 (two) times daily as needed. 11/27/19   [provider]  levothyroxine (SYNTHROID, LEVOTHROID) 88 MCG tablet Take 88 mcg by mouth daily.    [provider]  losartan-hydrochlorothiazide (HYZAAR) 50-12.5 MG per tablet Take 1 tablet by mouth daily.    [provider]  senna-docusate (SENOKOT-S) 8.6-50 MG tablet Take 1 tablet by mouth daily. 05/07/21   Sabas Sous, MD  Vitamin D, Ergocalciferol, (DRISDOL) 1.25 MG (50000 UNIT) CAPS capsule Take 1 capsule (50,000 Units total) by mouth every 7 (seven) days. 05/26/21   Roswell Nickel, DO     Allergies    Patient has no known allergies.   Review of Systems   Review of Systems A comprehensive review of systems was completed and negative except as noted in HPI.    Physical Exam BP (!) 141/63 (BP Location: Left Arm)   Pulse 78   Temp 98.7 F (37.1 C) (Oral)   Resp 20   Ht 5\' 11"  (1.803 m)   Wt (!) 148.8 kg   LMP 06/28/2021   SpO2 100%   BMI 45.75 kg/m   Physical Exam  Vitals and nursing note reviewed.  Constitutional:      Appearance: Normal appearance.  HENT:     Head: Normocephalic and atraumatic.     Nose: Nose normal.     Mouth/Throat:     Mouth: Mucous membranes are moist.  Eyes:     Extraocular Movements: Extraocular movements intact.     Conjunctiva/sclera: Conjunctivae normal.  Cardiovascular:     Rate and Rhythm: Normal rate.  Pulmonary:     Effort: Pulmonary effort is normal.     Breath sounds: Normal breath sounds.  Abdominal:     General: Abdomen is flat.     Palpations: Abdomen is soft.     Tenderness: There is no abdominal tenderness.  Musculoskeletal:        General: No swelling. Normal range of motion.     Cervical back: Neck supple.  Skin:    General: Skin is warm and dry.  Neurological:     General: No focal deficit present.     Mental Status: She is alert and  oriented to person, place, and time.     Cranial Nerves: No cranial nerve deficit.     Sensory: No sensory deficit.     Motor: No weakness.  Psychiatric:        Mood and Affect: Mood normal.     ED Results / Procedures / Treatments   Labs (all labs ordered are listed, but only abnormal results are displayed) Labs Reviewed  CBG MONITORING, ED - Abnormal; Notable for the following components:      Result Value   Glucose-Capillary 100 (*)    All other components within normal limits    EKG None   Radiology No results found.  Procedures Procedures  Medications Ordered in the ED Medications  ketorolac (TORADOL) 30 MG/ML injection 30 mg (has no administration in time range)  metoCLOPramide (REGLAN) injection 10 mg (has no administration in time range)     MDM Rules/Calculators/A&P MDM Patient with headaches consistent with migraines, has had some visual disturbances as well as a brief period of hand weakness this evening. At the time of my evaluation she has not focal deficits to suggest a stroke or ICH. Similar symptoms last year prompted an ED visit where a CTA was done and negative. Labs done at PCP office last week reviewed and normal. Will treat with Reglan/Toradol and reassess for dispo.   ED Course  I have reviewed the triage vital signs and the nursing notes.  Pertinent labs & imaging results that were available during my care of the patient were reviewed by me and considered in my medical decision making (see chart for details).  Clinical Course as of 06/28/21 2302  Tue Jun 28, 2021  2236 Patient feeling better, wants to go home. Recommend she follow up with PCP and/or Neurology for long term management of her headaches. RTED for any other concerns.  [CS]    Clinical Course User Index [CS] Pollyann Savoy, MD    Final Clinical Impression(s) / ED Diagnoses Final diagnoses:  None    Rx / DC Orders ED Discharge Orders     None        Pollyann Savoy, MD 06/28/21 2302

## 2021-06-28 NOTE — ED Triage Notes (Addendum)
C/o headache x 2 weeks ,  sudden sharp pain across front of head and then neck pain and pt  left arm weakness x 1 hr . Pt reports head feels " foggy" and slow to get thoughts together

## 2021-07-05 ENCOUNTER — Ambulatory Visit (INDEPENDENT_AMBULATORY_CARE_PROVIDER_SITE_OTHER): Payer: 59 | Admitting: Family Medicine

## 2021-07-07 ENCOUNTER — Encounter (INDEPENDENT_AMBULATORY_CARE_PROVIDER_SITE_OTHER): Payer: Self-pay | Admitting: Family Medicine

## 2021-07-07 ENCOUNTER — Other Ambulatory Visit: Payer: Self-pay

## 2021-07-07 ENCOUNTER — Ambulatory Visit (INDEPENDENT_AMBULATORY_CARE_PROVIDER_SITE_OTHER): Payer: 59 | Admitting: Family Medicine

## 2021-07-07 VITALS — BP 122/74 | HR 63 | Temp 97.7°F | Ht 70.0 in | Wt 326.0 lb

## 2021-07-07 DIAGNOSIS — Z9189 Other specified personal risk factors, not elsewhere classified: Secondary | ICD-10-CM

## 2021-07-07 DIAGNOSIS — Z6841 Body Mass Index (BMI) 40.0 and over, adult: Secondary | ICD-10-CM | POA: Diagnosis not present

## 2021-07-07 DIAGNOSIS — E559 Vitamin D deficiency, unspecified: Secondary | ICD-10-CM

## 2021-07-07 DIAGNOSIS — K5909 Other constipation: Secondary | ICD-10-CM

## 2021-07-07 MED ORDER — VITAMIN D (ERGOCALCIFEROL) 1.25 MG (50000 UNIT) PO CAPS: 50000.0000 [IU] | ORAL_CAPSULE | ORAL | 0 refills | Status: DC

## 2021-07-07 NOTE — Progress Notes (Signed)
Chief Complaint:   OBESITY Julia Mccoy is here to discuss her progress with her obesity treatment plan along with follow-up of her obesity related diagnoses. Julia Mccoy is on the Category 2 Plan and states she is following her eating plan approximately 40% of the time. Julia Mccoy states she is walking 20 minutes 3-4 times per week.  Today's visit was #: 4 Starting weight: 340 lbs Starting date: 04/20/2021 Today's weight: 326 lbs Today's date: 07/07/2021 Total lbs lost to date: 14 Total lbs lost since last in-office visit: 2  Interim History: Julia Mccoy's church was on a fast for 30 days, unable to eat on plan or much at all and she just came off it couple days ago.   She was unable to eat proteins.  Pt is trying to eat better but she has chronic migraines that have been a deterrent lately as well- not feeling well and thus can't prep or cook etc.  She is also drinking caloric beverages.   Subjective:   1. Chronic constipation Julia Mccoy drinks 2 bottles of water a day on average and 2 Bai drinks per day on average.   2. Vitamin D insufficiency Julia Mccoy started prescription Vit D last OV and is tolerating medication(s) well without side effects.  Medication compliance is good and patient appears to be taking it as prescribed.  Denies additional concerns regarding this condition.   3. At risk for dehydration Julia Mccoy is at risk for dehydration due to inadequate intake.    Assessment/Plan:  No orders of the defined types were placed in this encounter.   Medications Discontinued During This Encounter  Medication Reason   Vitamin D, Ergocalciferol, (DRISDOL) 1.25 MG (50000 UNIT) CAPS capsule Reorder     Meds ordered this encounter  Medications   Vitamin D, Ergocalciferol, (DRISDOL) 1.25 MG (50000 UNIT) CAPS capsule    Sig: Take 1 capsule (50,000 Units total) by mouth every 7 (seven) days.    Dispense:  4 capsule    Refill:  0    30 d supply;  ** OV for RF **   Do not send RF request      1. Chronic constipation Julia Mccoy was informed that a decrease in bowel movement frequency is normal while losing weight, but stools should not be hard or painful. Orders and follow up as documented in patient record. Increase water/calorie free/caffeine free liquids and start Miralax once or twice a day.  Counseling Getting to Good Bowel Health: Your goal is to have one soft bowel movement each day. Drink at least 8 glasses of water each day. Eat plenty of fiber (goal is over 25 grams each day). It is best to get most of your fiber from dietary sources which includes leafy green vegetables, fresh fruit, and whole grains. You may need to add fiber with the help of OTC fiber supplements. These include Metamucil, Citrucel, and Flaxseed. If you are still having trouble, try adding Miralax or Magnesium Citrate. If all of these changes do not work, Dietitian.  2. Vitamin D insufficiency Low Vitamin D level contributes to fatigue and are associated with obesity, breast, and colon cancer. She agrees to continue to take prescription Vitamin D 50,000 IU every week and will follow-up for routine testing of Vitamin D, at least 2-3 times per year to avoid over-replacement.  Refill- Vitamin D, Ergocalciferol, (DRISDOL) 1.25 MG (50000 UNIT) CAPS capsule; Take 1 capsule (50,000 Units total) by mouth every 7 (seven) days.  Dispense: 4 capsule; Refill:  0  3. At risk for dehydration Julia Mccoy was given approximately 9 minutes dehydration prevention counseling today. Julia Mccoy is at risk for dehydration due to weight loss and current medication(s). She was encouraged to hydrate and monitor fluid status to avoid dehydration as well as weight loss plateaus.   4. Obesity with current BMI of 46.8  Julia Mccoy is currently in the action stage of change. As such, her goal is to continue with weight loss efforts. She has agreed to the Category 2 Plan.   Exercise goals:  As is  Behavioral modification strategies:  increasing water intake and decreasing liquid calories.  Julia Mccoy has agreed to follow-up with our clinic in 2-3 weeks. She was informed of the importance of frequent follow-up visits to maximize her success with intensive lifestyle modifications for her multiple health conditions.   Objective:   Blood pressure 122/74, pulse 63, temperature 97.7 F (36.5 C), height 5\' 10"  (1.778 m), weight (!) 326 lb (147.9 kg), last menstrual period 06/28/2021, SpO2 99 %. Body mass index is 46.78 kg/m.  General: Cooperative, alert, well developed, in no acute distress. HEENT: Conjunctivae and lids unremarkable. Cardiovascular: Regular rhythm.  Lungs: Normal work of breathing. Neurologic: No focal deficits.   Lab Results  Component Value Date   CREATININE 0.70 05/03/2021   BUN 13 05/03/2021   NA 135 05/03/2021   K 3.9 05/03/2021   CL 101 05/03/2021   CO2 19 (L) 05/03/2021   Lab Results  Component Value Date   ALT 13 05/03/2021   AST 16 05/03/2021   ALKPHOS 60 05/03/2021   BILITOT 0.4 05/03/2021   Lab Results  Component Value Date   HGBA1C 5.6 05/03/2021   Lab Results  Component Value Date   INSULIN 22.4 05/03/2021   Lab Results  Component Value Date   TSH 2.860 05/03/2021   Lab Results  Component Value Date   CHOL 159 05/03/2021   HDL 37 (L) 05/03/2021   LDLCALC 108 (H) 05/03/2021   TRIG 69 05/03/2021   Lab Results  Component Value Date   VD25OH 25.8 (L) 05/03/2021   Lab Results  Component Value Date   WBC 6.2 01/07/2021   HGB 13.8 01/07/2021   HCT 42.8 01/07/2021   MCV 86.8 01/07/2021   PLT 245 01/07/2021   Attestation Statements:   Reviewed by clinician on day of visit: allergies, medications, problem list, medical history, surgical history, family history, social history, and previous encounter notes.  01/09/2021, CMA, am acting as transcriptionist for Edmund Hilda, DO.  I have reviewed the above documentation for accuracy and completeness, and I  agree with the above. [employer], D.O.  The 21st Century Cures Act was signed into law in 2016 which includes the topic of electronic health records.  This provides immediate access to information in MyChart.  This includes consultation notes, operative notes, office notes, lab results and pathology reports.  If you have any questions about what you read please let 2017 know at your next visit so we can discuss your concerns and take corrective action if need be.  We are right here with you.

## 2021-07-21 ENCOUNTER — Other Ambulatory Visit: Payer: Self-pay

## 2021-07-21 ENCOUNTER — Ambulatory Visit (INDEPENDENT_AMBULATORY_CARE_PROVIDER_SITE_OTHER): Payer: 59 | Admitting: Family Medicine

## 2021-07-21 ENCOUNTER — Encounter (INDEPENDENT_AMBULATORY_CARE_PROVIDER_SITE_OTHER): Payer: Self-pay | Admitting: Family Medicine

## 2021-07-21 VITALS — BP 117/73 | HR 64 | Temp 98.2°F | Ht 70.0 in | Wt 326.0 lb

## 2021-07-21 DIAGNOSIS — Z9189 Other specified personal risk factors, not elsewhere classified: Secondary | ICD-10-CM

## 2021-07-21 DIAGNOSIS — R7303 Prediabetes: Secondary | ICD-10-CM | POA: Diagnosis not present

## 2021-07-21 DIAGNOSIS — Z6841 Body Mass Index (BMI) 40.0 and over, adult: Secondary | ICD-10-CM

## 2021-07-21 DIAGNOSIS — E66813 Obesity, class 3: Secondary | ICD-10-CM

## 2021-07-21 DIAGNOSIS — E559 Vitamin D deficiency, unspecified: Secondary | ICD-10-CM | POA: Diagnosis not present

## 2021-07-21 MED ORDER — VITAMIN D (ERGOCALCIFEROL) 1.25 MG (50000 UNIT) PO CAPS: 50000.0000 [IU] | ORAL_CAPSULE | ORAL | 0 refills | Status: DC

## 2021-07-21 NOTE — Progress Notes (Signed)
Chief Complaint:   OBESITY Julia Mccoy is here to discuss her progress with her obesity treatment plan along with follow-up of her obesity related diagnoses. Julia Mccoy is on the Category 2 Plan and states she is following her eating plan approximately 60-70% of the time. Julia Mccoy states she is walking the dog for 15-20 minutes 2 times per week.  Today's visit was #: 5 Starting weight: 340 lbs Starting date: 04/20/2021 Today's weight: 326 lbs Today's date: 07/21/2021 Total lbs lost to date: 14 lbs Total lbs lost since last in-office visit: 0  Interim History: Julia Mccoy finds that she gets hungry between meals. She has not yet gotten back on track since she fasted along with her church.She has gotten back to Olipop (50 calories in a can), especially when she has migraines.  She does not cook much or meal prep.  Subjective:   1. Vitamin D insufficiency Julia Mccoy's Vitamin D is low at 25.8. She is on weekly prescription Vitamin D.  Lab Results  Component Value Date   VD25OH 25.8 (L) 05/03/2021    2. Pre-diabetes Julia Mccoy's last A1C was 5.6. She is not on Metformin. She notes polyphagia.    Lab Results  Component Value Date   HGBA1C 5.6 05/03/2021   Lab Results  Component Value Date   INSULIN 22.4 05/03/2021    3. At risk for impaired metabolic function Julia Mccoy is at risk for impaired metabolic function due to inadequate protein intake.  Assessment/Plan:   1. Vitamin D insufficiency  We will refill prescription Vitamin D 50,000 IU every week for 1 month with no refills and Julia Mccoy will follow-up for routine testing of Vitamin D, at least 2-3 times per year to avoid over-replacement.  - Vitamin D, Ergocalciferol, (DRISDOL) 1.25 MG (50000 UNIT) CAPS capsule; Take 1 capsule (50,000 Units total) by mouth every 7 (seven) days.  Dispense: 4 capsule; Refill: 0  2. Pre-diabetes We discussed Mounjaro and Spruha will consider it. She has no history of cholelithiasis, pancreatitis or  family/personal history of thyroid cancer.    3. At risk for impaired metabolic function Julia Mccoy was given approximately 15 minutes of impaired  metabolic function prevention counseling today. We discussed intensive lifestyle modifications today with an emphasis on specific nutrition and exercise instructions and strategies.   Repetitive spaced learning was employed today to elicit superior memory formation and behavioral change.   4. Obesity with current BMI of 46.78 Julia Mccoy is currently in the action stage of change. As such, her goal is to continue with weight loss efforts. She has agreed to the Category 2 Plan and keeping a food journal and adhering to recommended goals of 300-400 calories and 30 grams of protein at lunch.  Julia Mccoy will try to cook meat for supper a few times per week.  Exercise goals:  As is.  Behavioral modification strategies: increasing lean protein intake and decreasing simple carbohydrates.  Julia Mccoy has agreed to follow-up with our clinic in 2 weeks with Dr. Sharee Holster.   Objective:   Blood pressure 117/73, pulse 64, temperature 98.2 F (36.8 C), height 5\' 10"  (1.778 m), weight (!) 326 lb (147.9 kg), last menstrual period 06/28/2021, SpO2 98 %. Body mass index is 46.78 kg/m.  General: Cooperative, alert, well developed, in no acute distress. HEENT: Conjunctivae and lids unremarkable. Cardiovascular: Regular rhythm.  Lungs: Normal work of breathing. Neurologic: No focal deficits.   Lab Results  Component Value Date   CREATININE 0.70 05/03/2021   BUN 13 05/03/2021   NA 135 05/03/2021  K 3.9 05/03/2021   CL 101 05/03/2021   CO2 19 (L) 05/03/2021   Lab Results  Component Value Date   ALT 13 05/03/2021   AST 16 05/03/2021   ALKPHOS 60 05/03/2021   BILITOT 0.4 05/03/2021   Lab Results  Component Value Date   HGBA1C 5.6 05/03/2021   Lab Results  Component Value Date   INSULIN 22.4 05/03/2021   Lab Results  Component Value Date   TSH 2.860  05/03/2021   Lab Results  Component Value Date   CHOL 159 05/03/2021   HDL 37 (L) 05/03/2021   LDLCALC 108 (H) 05/03/2021   TRIG 69 05/03/2021   Lab Results  Component Value Date   VD25OH 25.8 (L) 05/03/2021   Lab Results  Component Value Date   WBC 6.2 01/07/2021   HGB 13.8 01/07/2021   HCT 42.8 01/07/2021   MCV 86.8 01/07/2021   PLT 245 01/07/2021   No results found for: IRON, TIBC, FERRITIN  Attestation Statements:   Reviewed by clinician on day of visit: allergies, medications, problem list, medical history, surgical history, family history, social history, and previous encounter notes.   I, Jackson Latino, RMA, am acting as Energy manager for [place], FNP.   I have reviewed the above documentation for accuracy and completeness, and I agree with the above. -  Jesse Sans, FNP

## 2021-07-22 DIAGNOSIS — E559 Vitamin D deficiency, unspecified: Secondary | ICD-10-CM | POA: Insufficient documentation

## 2021-08-04 ENCOUNTER — Encounter (INDEPENDENT_AMBULATORY_CARE_PROVIDER_SITE_OTHER): Payer: Self-pay | Admitting: Family Medicine

## 2021-08-04 ENCOUNTER — Ambulatory Visit (INDEPENDENT_AMBULATORY_CARE_PROVIDER_SITE_OTHER): Payer: 59 | Admitting: Family Medicine

## 2021-08-04 ENCOUNTER — Other Ambulatory Visit: Payer: Self-pay

## 2021-08-04 VITALS — BP 129/89 | HR 65 | Temp 97.8°F | Ht 70.0 in | Wt 325.0 lb

## 2021-08-04 DIAGNOSIS — Z9189 Other specified personal risk factors, not elsewhere classified: Secondary | ICD-10-CM | POA: Diagnosis not present

## 2021-08-04 DIAGNOSIS — R7303 Prediabetes: Secondary | ICD-10-CM | POA: Diagnosis not present

## 2021-08-04 DIAGNOSIS — E559 Vitamin D deficiency, unspecified: Secondary | ICD-10-CM | POA: Diagnosis not present

## 2021-08-04 DIAGNOSIS — K5909 Other constipation: Secondary | ICD-10-CM

## 2021-08-04 DIAGNOSIS — Z6841 Body Mass Index (BMI) 40.0 and over, adult: Secondary | ICD-10-CM

## 2021-08-04 MED ORDER — VITAMIN D (ERGOCALCIFEROL) 1.25 MG (50000 UNIT) PO CAPS: 50000.0000 [IU] | ORAL_CAPSULE | ORAL | 0 refills | Status: DC

## 2021-08-08 ENCOUNTER — Encounter: Payer: Self-pay | Admitting: Neurology

## 2021-08-08 ENCOUNTER — Ambulatory Visit (INDEPENDENT_AMBULATORY_CARE_PROVIDER_SITE_OTHER): Payer: 59 | Admitting: Neurology

## 2021-08-08 VITALS — BP 144/79 | HR 78 | Ht 71.0 in | Wt 328.0 lb

## 2021-08-08 DIAGNOSIS — G932 Benign intracranial hypertension: Secondary | ICD-10-CM

## 2021-08-08 MED ORDER — ACETAZOLAMIDE ER 500 MG PO CP12: 500.0000 mg | ORAL_CAPSULE | Freq: Two times a day (BID) | ORAL | 0 refills | Status: DC

## 2021-08-08 NOTE — Progress Notes (Signed)
Chief Complaint:   OBESITY Julia Mccoy is here to discuss her progress with her obesity treatment plan along with follow-up of her obesity related diagnoses. Julia Mccoy is on the Category 2 Plan and keeping a food journal and adhering to recommended goals of 300-400 calories and 30 grams protein at lunch and states she is following her eating plan approximately 80% of the time. Julia Mccoy states she is not currently exercising.  Today's visit was #: 6 Starting weight: 340 lbs Starting date: 04/20/2021 Today's weight: 325 lbs Today's date: 08/04/2021 Total lbs lost to date: 15 Total lbs lost since last in-office visit: 1  Interim History: Julia Mccoy is here for a follow up office visit.  We reviewed her meal plan and questions were answered.  Patient's food recall appears to be accurate and consistent with what is on plan when she is following it.   When eating on plan, her hunger and cravings are well controlled.   Pt relies on replacing one meal with premier protein.  Subjective:   1. Other constipation Julia Mccoy was last seen by me on 07/07/2021. She has increased her water intake and hence constipation has improved. She does Miralax 2-3 times a month.  2. Pre-diabetes NP Dawn spoke with pt about Mounjaro at last OV. Pt wishes to hold off for now.  3. Vitamin D insufficiency She is currently taking prescription vitamin D 50,000 IU each week. She denies nausea, vomiting or muscle weakness.  Lab Results  Component Value Date   VD25OH 25.8 (L) 05/03/2021   4. At risk for deficient intake of food The patient is at a higher than average risk of deficient intake of food due to inadequate intake.  Assessment/Plan:  No orders of the defined types were placed in this encounter.   Medications Discontinued During This Encounter  Medication Reason   Vitamin D, Ergocalciferol, (DRISDOL) 1.25 MG (50000 UNIT) CAPS capsule Reorder     Meds ordered this encounter  Medications   Vitamin D,  Ergocalciferol, (DRISDOL) 1.25 MG (50000 UNIT) CAPS capsule    Sig: Take 1 capsule (50,000 Units total) by mouth every 7 (seven) days.    Dispense:  4 capsule    Refill:  0    30 d supply;  ** OV for RF **   Do not send RF request     1. Other constipation Continue to push water and Miralax prn.   2. Pre-diabetes Counseling done regarding the importance of decreasing simple carbs and increasing protein. Handout on Metformin given to pt.  3. Vitamin D insufficiency Low Vitamin D level contributes to fatigue and are associated with obesity, breast, and colon cancer. She agrees to continue to take prescription Vitamin D 50,000 IU every week and will follow-up for routine testing of Vitamin D, at least 2-3 times per year to avoid over-replacement.  Refill- Vitamin D, Ergocalciferol, (DRISDOL) 1.25 MG (50000 UNIT) CAPS capsule; Take 1 capsule (50,000 Units total) by mouth every 7 (seven) days.  Dispense: 4 capsule; Refill: 0  4. At risk for deficient intake of food Julia Mccoy was given approximately 9 minutes of deficit intake of food prevention counseling today. Julia Mccoy is at risk for eating too few calories based on current food recall. She was encouraged to focus on meeting caloric and protein goals according to her recommended meal plan.    5. Obesity with current BMI of 46.7  Julia Mccoy is currently in the action stage of change. As such, her goal is to continue  with weight loss efforts. She has agreed to the Category 2 Plan and keeping a food journal and adhering to recommended goals of 300-400 calories and 30+ grams protein with lunch.   Exercise goals:  Pt desires to start walking 30 minutes 3 days a week.  Behavioral modification strategies: increasing lean protein intake, decreasing simple carbohydrates, increasing water intake, and planning for success.  Julia Mccoy has agreed to follow-up with our clinic in 4 weeks, per pt preference. She was informed of the importance of frequent follow-up  visits to maximize her success with intensive lifestyle modifications for her multiple health conditions.   Objective:   Blood pressure 129/89, pulse 65, temperature 97.8 F (36.6 C), height 5\' 10"  (1.778 m), weight (!) 325 lb (147.4 kg), SpO2 99 %. Body mass index is 46.63 kg/m.  General: Cooperative, alert, well developed, in no acute distress. HEENT: Conjunctivae and lids unremarkable. Cardiovascular: Regular rhythm.  Lungs: Normal work of breathing. Neurologic: No focal deficits.   Lab Results  Component Value Date   CREATININE 0.70 05/03/2021   BUN 13 05/03/2021   NA 135 05/03/2021   K 3.9 05/03/2021   CL 101 05/03/2021   CO2 19 (L) 05/03/2021   Lab Results  Component Value Date   ALT 13 05/03/2021   AST 16 05/03/2021   ALKPHOS 60 05/03/2021   BILITOT 0.4 05/03/2021   Lab Results  Component Value Date   HGBA1C 5.6 05/03/2021   Lab Results  Component Value Date   INSULIN 22.4 05/03/2021   Lab Results  Component Value Date   TSH 2.860 05/03/2021   Lab Results  Component Value Date   CHOL 159 05/03/2021   HDL 37 (L) 05/03/2021   LDLCALC 108 (H) 05/03/2021   TRIG 69 05/03/2021   Lab Results  Component Value Date   VD25OH 25.8 (L) 05/03/2021   Lab Results  Component Value Date   WBC 6.2 01/07/2021   HGB 13.8 01/07/2021   HCT 42.8 01/07/2021   MCV 86.8 01/07/2021   PLT 245 01/07/2021    Attestation Statements:   Reviewed by clinician on day of visit: allergies, medications, problem list, medical history, surgical history, family history, social history, and previous encounter notes.  01/09/2021, CMA, am acting as transcriptionist for Edmund Hilda, DO.  I have reviewed the above documentation for accuracy and completeness, and I agree with the above. [employer], D.O.  The 21st Century Cures Act was signed into law in 2016 which includes the topic of electronic health records.  This provides immediate access to information in  MyChart.  This includes consultation notes, operative notes, office notes, lab results and pathology reports.  If you have any questions about what you read please let 2017 know at your next visit so we can discuss your concerns and take corrective action if need be.  We are right here with you.

## 2021-08-08 NOTE — Progress Notes (Signed)
[facility]  PATIENT: Julia Mccoy DOB: 03/18/1976  REFERRING CLINICIAN: Bryon Lions, PA-C HISTORY FROM: Patient  REASON FOR VISIT: Headaches    HISTORICAL  CHIEF COMPLAINT:  Chief Complaint  Patient presents with   New Patient (Initial Visit)    Rm 12, alone, here to discuss headaches, reports 1-2 headaches a week, states they have deceased in the last 2 weeks     HISTORY OF PRESENT ILLNESS:  This is a 45 year old woman with past medical history of hypertension, hypothyroidism, sleep apnea on CPAP< anxiety and obesity who is presenting for headache.  Patient stated headache started last year on April, she has pressure type headache on the left side of her forehead, sometimes behind eyes, and sometimes on the right side.  She presented to the ED in April 2021, had a CTA head and neck which was normal and her CT head showed evidence of empty sella.  She was not started on any medication but was told to follow-up with her primary care doctor.  Patient followed-up with her primary care, was started on Effexor but did not take the medication due to concern of side effect.  She still complains of occasional headache lasting 30 minutes to 1 hour, she takes acetaminophen which seemed to resolve the headache.  Last episode was 2 weeks ago.  There are no associated nausea, no vomiting, no migrainous features.  She had a family history of migraine with her mother.     Headache History and Characteristics: Onset: April 2021, went to the ED  Location: All over  Quality: Pressure, aching   Intensity:7 /10.  Duration: last for about 1 hour Migrainous Features: No Photophobia, no phonophobia, no nausea/vomiting.  Aura: No  History of brain injury or tumor: No  Family history: Mother with migraines  Motion sickness: no Cardiac history: no  OTC: tylenol Sleep: Sleep is pretty good, will get about 6 hrs, has CPAP Mood/ Stress: ok  Prior prophylaxis: Propranolol:  No  Verapamil:No TCA: No Topamax: No Depakote: No Effexor: No Cymbalta: No Neurontin:No  Prior abortives: Triptan: No Anti-emetic: No Steroids: No Ergotamine suppository: No  OTHER MEDICAL CONDITIONS: HTN, Hypothyroidism, Sleep apnea, Anxiety    REVIEW OF SYSTEMS: Full 14 system review of systems performed and negative with exception of: as noted in the HPI   ALLERGIES: No Known Allergies  HOME MEDICATIONS: Outpatient Medications Prior to Visit  Medication Sig Dispense Refill   amLODipine (NORVASC) 10 MG tablet Take 10 mg by mouth daily.     busPIRone (BUSPAR) 10 MG tablet Take 10 mg by mouth 2 (two) times daily as needed.     levothyroxine (SYNTHROID, LEVOTHROID) 88 MCG tablet Take 88 mcg by mouth daily.     losartan-hydrochlorothiazide (HYZAAR) 50-12.5 MG per tablet Take 1 tablet by mouth daily.     senna-docusate (SENOKOT-S) 8.6-50 MG tablet Take 1 tablet by mouth daily. 30 tablet 0   Vitamin D, Ergocalciferol, (DRISDOL) 1.25 MG (50000 UNIT) CAPS capsule Take 1 capsule (50,000 Units total) by mouth every 7 (seven) days. 4 capsule 0   No facility-administered medications prior to visit.    PAST MEDICAL HISTORY: Past Medical History:  Diagnosis Date   Anxiety    Back pain    Chest pain    Constipation    Depression    Edema of both lower extremities    GERD (gastroesophageal reflux disease)    Hypertension    Hypothyroidism    Joint pain  Obesity    Palpitations    Pre-diabetes    Sleep apnea     PAST SURGICAL HISTORY: Past Surgical History:  Procedure Laterality Date   KELOID EXCISION      FAMILY HISTORY: Family History  Problem Relation Age of Onset   Hypertension Mother    Hyperlipidemia Mother    Depression Mother    Hypertension Father    Hyperlipidemia Father    Diabetes Father    Cancer Father    Heart attack Cousin     SOCIAL HISTORY: Social History   Socioeconomic History   Marital status: Single    Spouse name: Not on file    Number of children: Not on file   Years of education: Not on file   Highest education level: Not on file  Occupational History   Occupation: Therapist Mental Health  Tobacco Use   Smoking status: Never   Smokeless tobacco: Never  Substance and Sexual Activity   Alcohol use: No   Drug use: No   Sexual activity: Not on file  Other Topics Concern   Not on file  Social History Narrative   Not on file   Social Determinants of Health   Financial Resource Strain: Not on file  Food Insecurity: Not on file  Transportation Needs: Not on file  Physical Activity: Not on file  Stress: Not on file  Social Connections: Not on file  Intimate Partner Violence: Not on file     PHYSICAL EXAM  GENERAL EXAM/CONSTITUTIONAL: Vitals:  Vitals:   08/08/21 0841  BP: (!) 144/79  Pulse: 78  Weight: (!) 328 lb (148.8 kg)  Height: 5\' 11"  (1.803 m)   Body mass index is 45.75 kg/m. Wt Readings from Last 3 Encounters:  08/08/21 (!) 328 lb (148.8 kg)  08/04/21 (!) 325 lb (147.4 kg)  07/21/21 (!) 326 lb (147.9 kg)   Patient is in no distress; well developed, nourished and groomed; neck is supple  EYES: Pupils round and reactive to light, Visual fields full to confrontation, Extraocular movements intacts,   MUSCULOSKELETAL: Gait, strength, tone, movements noted in Neurologic exam below  NEUROLOGIC: MENTAL STATUS:  No flowsheet data found. awake, alert, oriented to person, place and time recent and remote memory intact normal attention and concentration language fluent, comprehension intact, naming intact fund of knowledge appropriate  CRANIAL NERVE:  2nd, 3rd, 4th, 6th - pupils equal and reactive to light, visual fields full to confrontation, extraocular muscles intact, no nystagmus 5th - facial sensation symmetric 7th - facial strength symmetric 8th - hearing intact 9th - palate elevates symmetrically, uvula midline 11th - shoulder shrug symmetric 12th - tongue protrusion  midline  MOTOR:  normal bulk and tone, full strength in the BUE, BLE  SENSORY:  normal and symmetric to light touch, pinprick, temperature, vibration  COORDINATION:  finger-nose-finger, fine finger movements normal  REFLEXES:  deep tendon reflexes present and symmetric  GAIT/STATION:  normal    DIAGNOSTIC DATA (LABS, IMAGING, TESTING) - I reviewed patient records, labs, notes, testing and imaging myself where available.  Lab Results  Component Value Date   WBC 6.2 01/07/2021   HGB 13.8 01/07/2021   HCT 42.8 01/07/2021   MCV 86.8 01/07/2021   PLT 245 01/07/2021      Component Value Date/Time   NA 135 05/03/2021 1342   K 3.9 05/03/2021 1342   CL 101 05/03/2021 1342   CO2 19 (L) 05/03/2021 1342   GLUCOSE 99 05/03/2021 1342   GLUCOSE 96 01/07/2021 1709  BUN 13 05/03/2021 1342   CREATININE 0.70 05/03/2021 1342   CALCIUM 8.8 05/03/2021 1342   PROT 7.6 05/03/2021 1342   ALBUMIN 4.2 05/03/2021 1342   AST 16 05/03/2021 1342   ALT 13 05/03/2021 1342   ALKPHOS 60 05/03/2021 1342   BILITOT 0.4 05/03/2021 1342   GFRNONAA >60 01/07/2021 1709   GFRAA >60 02/03/2020 1853   Lab Results  Component Value Date   CHOL 159 05/03/2021   HDL 37 (L) 05/03/2021   LDLCALC 108 (H) 05/03/2021   TRIG 69 05/03/2021   Lab Results  Component Value Date   HGBA1C 5.6 05/03/2021   No results found for: [id number] Lab Results  Component Value Date   TSH 2.860 05/03/2021    CT HEAD IMPRESSION: 1. No acute intracranial abnormality. 2. Empty sella. While this finding is often incidental in nature and of no clinical significance, this can also be seen in the setting of idiopathic intracranial hypertension. 3. Otherwise unremarkable head CT.    CTA HEAD AND NECK IMPRESSION: Normal CTA of the head and neck. No large vessel occlusion, hemodynamically significant stenosis, or other acute vascular abnormality. No aneurysm.   ASSESSMENT AND PLAN  45 y.o. year old female with past  medical history of hypertension, anxiety, sleep apnea on CPAP, obesity who is presenting with occasional headaches since April of last year.  Headaches are described as pressure type pain that can start on the left temple area, behind eyes or even on the right side of head.  There are no migrainous features and headaches last 30 minutes to 1 hour.  Her head imaging shows empty sella and there is also reported visual change with the headaches, no definitive visual obscuration but said that her eyes sometimes gets fuzzy.  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  Advised her to contact me if her headaches are not resolved.  Follow-up in 75-month   1. IIH (idiopathic intracranial hypertension)     PLAN: Start with Diamox 500 mg twice a day  Follow up in 6 months   No orders of the defined types were placed in this encounter.   Meds ordered this encounter  Medications   acetaZOLAMIDE ER (DIAMOX) 500 MG capsule    Sig: Take 1 capsule (500 mg total) by mouth 2 (two) times daily.    Dispense:  60 capsule    Refill:  0     Return in about 6 months (around 02/06/2022).    Windell Norfolk, MD 08/08/2021, 10:43 AM  [facility] [address], [address] [place], [place] [address] [phone]

## 2021-08-08 NOTE — Patient Instructions (Signed)
Start with Diamox 500 mg twice a day  Follow up in 6 months

## 2021-08-25 ENCOUNTER — Ambulatory Visit (INDEPENDENT_AMBULATORY_CARE_PROVIDER_SITE_OTHER): Payer: 59 | Admitting: Family Medicine

## 2021-08-30 ENCOUNTER — Other Ambulatory Visit: Payer: Self-pay | Admitting: Neurology

## 2021-09-01 ENCOUNTER — Other Ambulatory Visit: Payer: Self-pay

## 2021-09-01 ENCOUNTER — Ambulatory Visit (INDEPENDENT_AMBULATORY_CARE_PROVIDER_SITE_OTHER): Payer: 59 | Admitting: Family Medicine

## 2021-09-01 ENCOUNTER — Encounter (INDEPENDENT_AMBULATORY_CARE_PROVIDER_SITE_OTHER): Payer: Self-pay | Admitting: Family Medicine

## 2021-09-01 VITALS — BP 118/81 | HR 74 | Temp 97.7°F | Ht 70.0 in | Wt 324.0 lb

## 2021-09-01 DIAGNOSIS — Z6841 Body Mass Index (BMI) 40.0 and over, adult: Secondary | ICD-10-CM

## 2021-09-01 DIAGNOSIS — Z9189 Other specified personal risk factors, not elsewhere classified: Secondary | ICD-10-CM

## 2021-09-01 DIAGNOSIS — E7849 Other hyperlipidemia: Secondary | ICD-10-CM

## 2021-09-01 DIAGNOSIS — E559 Vitamin D deficiency, unspecified: Secondary | ICD-10-CM

## 2021-09-01 DIAGNOSIS — R7303 Prediabetes: Secondary | ICD-10-CM

## 2021-09-01 MED ORDER — VITAMIN D (ERGOCALCIFEROL) 1.25 MG (50000 UNIT) PO CAPS: 50000.0000 [IU] | ORAL_CAPSULE | ORAL | 0 refills | Status: DC

## 2021-09-01 NOTE — Progress Notes (Signed)
Chief Complaint:   OBESITY Julia Mccoy is here to discuss her progress with her obesity treatment plan along with follow-up of her obesity related diagnoses. Julia Mccoy is on the Category 2 Plan and keeping a food journal and adhering to recommended goals of 300-400 calories and 30 grams protein with lunch and states she is following her eating plan approximately 60-70% of the time. Julia Mccoy states she is not currently exercising.  Today's visit was #: 7 Starting weight: 340 lbs Starting date: 04/20/2021 Today's weight: 324 lbs Today's date: 09/01/2021 Total lbs lost to date: 16 Total lbs lost since last in-office visit: 1  Interim History: Pt mostly eats on plan for breakfast but struggles with lunch. She is eating out 2-3 days a week, has off plan foods (but making better decisions than prior), and drinks caloric beverages everyday.   Subjective:   1. Vitamin D insufficiency She is currently taking prescription vitamin D 50,000 IU each week. She denies nausea, vomiting or muscle weakness.  Lab Results  Component Value Date   VD25OH 25.8 (L) 05/03/2021   2. Pre-diabetes [name] has a diagnosis of prediabetes based on her elevated HgA1c and was informed this puts her at greater risk of developing diabetes. She continues to work on diet and exercise to decrease her risk of diabetes. She denies nausea or hypoglycemia.  Lab Results  Component Value Date   HGBA1C 5.6 05/03/2021   Lab Results  Component Value Date   INSULIN 22.4 05/03/2021   3. Other hyperlipidemia Julia Mccoy has hyperlipidemia and has been trying to improve her cholesterol levels with intensive lifestyle modification including a low saturated fat diet, exercise and weight loss. She denies any chest pain, claudication or myalgias. Medication: None  Lab Results  Component Value Date   ALT 13 05/03/2021   AST 16 05/03/2021   ALKPHOS 60 05/03/2021   BILITOT 0.4 05/03/2021   Lab Results  Component Value Date   CHOL  159 05/03/2021   HDL 37 (L) 05/03/2021   LDLCALC 108 (H) 05/03/2021   TRIG 69 05/03/2021   4. At risk for diabetes mellitus Julia Mccoy is at risk for diabetes mellitus due to pre-diabetes and lifestyle.  Assessment/Plan:   Orders Placed This Encounter  Procedures   Hemoglobin A1c   Insulin, random   VITAMIN D 25 Hydroxy (Vit-D Deficiency, Fractures)   Lipid Panel With LDL/HDL Ratio    Medications Discontinued During This Encounter  Medication Reason   Vitamin D, Ergocalciferol, (DRISDOL) 1.25 MG (50000 UNIT) CAPS capsule Reorder     Meds ordered this encounter  Medications   Vitamin D, Ergocalciferol, (DRISDOL) 1.25 MG (50000 UNIT) CAPS capsule    Sig: Take 1 capsule (50,000 Units total) by mouth every 7 (seven) days.    Dispense:  4 capsule    Refill:  0    30 d supply;  ** OV for RF **   Do not send RF request     1. Vitamin D insufficiency Low Vitamin D level contributes to fatigue and are associated with obesity, breast, and colon cancer. She agrees to continue to take prescription Vitamin D 50,000 IU every week and will follow-up for routine testing of Vitamin D, at least 2-3 times per year to avoid over-replacement. Check labs at next OV.  Refill- Vitamin D, Ergocalciferol, (DRISDOL) 1.25 MG (50000 UNIT) CAPS capsule; Take 1 capsule (50,000 Units total) by mouth every 7 (seven) days.  Dispense: 4 capsule; Refill: 0  - VITAMIN D 25 Hydroxy (Vit-D  Deficiency, Fractures)  2. Pre-diabetes Julia Mccoy will continue to work on weight loss, exercise, and decreasing simple carbohydrates to help decrease the risk of diabetes. Check labs at next OV.  - Hemoglobin A1c - Insulin, random  3. Other hyperlipidemia Cardiovascular risk and specific lipid/LDL goals reviewed.  We discussed several lifestyle modifications today and Julia Mccoy will continue to work on diet, exercise and weight loss efforts. Orders and follow up as documented in patient record.   Counseling Intensive lifestyle  modifications are the first line treatment for this issue. Dietary changes: Increase soluble fiber. Decrease simple carbohydrates. Exercise changes: Moderate to vigorous-intensity aerobic activity 150 minutes per week if tolerated. Lipid-lowering medications: see documented in medical record. Check labs at next OV.  - Lipid Panel With LDL/HDL Ratio  4. At risk for diabetes mellitus Julia Mccoy was given approximately 10 minutes of diabetes education and counseling today. We discussed intensive lifestyle modifications today with an emphasis on weight loss as well as increasing exercise and decreasing simple carbohydrates in her diet. We also reviewed medication options with an emphasis on risk versus benefit of those discussed.   Repetitive spaced learning was employed today to elicit superior memory formation and behavioral change.  5. Obesity BMI today is 65  Julia Mccoy is currently in the action stage of change. As such, her goal is to continue with weight loss efforts. She has agreed to the Category 2 Plan and keeping a food journal and adhering to recommended goals of 300-400 calories and 30+ grams protein with lunch.   Pt's goals:  Make and pack lunch 5 days a week. Pack no calorie [employer] tea to drink (decrease caloric beverages). Pt provided with meal plan again.  Exercise goals:  As is  Behavioral modification strategies: decreasing simple carbohydrates, decreasing liquid calories, and decreasing eating out.  Julia Mccoy has agreed to follow-up with our clinic in 2-3 weeks, fasting for labs. She was informed of the importance of frequent follow-up visits to maximize her success with intensive lifestyle modifications for her multiple health conditions.   Objective:   Blood pressure 118/81, pulse 74, temperature 97.7 F (36.5 C), height 5\' 10"  (1.778 m), weight (!) 324 lb (147 kg), SpO2 99 %. Body mass index is 46.49 kg/m.  General: Cooperative, alert, well developed, in no acute  distress. HEENT: Conjunctivae and lids unremarkable. Cardiovascular: Regular rhythm.  Lungs: Normal work of breathing. Neurologic: No focal deficits.   Lab Results  Component Value Date   CREATININE 0.70 05/03/2021   BUN 13 05/03/2021   NA 135 05/03/2021   K 3.9 05/03/2021   CL 101 05/03/2021   CO2 19 (L) 05/03/2021   Lab Results  Component Value Date   ALT 13 05/03/2021   AST 16 05/03/2021   ALKPHOS 60 05/03/2021   BILITOT 0.4 05/03/2021   Lab Results  Component Value Date   HGBA1C 5.6 05/03/2021   Lab Results  Component Value Date   INSULIN 22.4 05/03/2021   Lab Results  Component Value Date   TSH 2.860 05/03/2021   Lab Results  Component Value Date   CHOL 159 05/03/2021   HDL 37 (L) 05/03/2021   LDLCALC 108 (H) 05/03/2021   TRIG 69 05/03/2021   Lab Results  Component Value Date   VD25OH 25.8 (L) 05/03/2021   Lab Results  Component Value Date   WBC 6.2 01/07/2021   HGB 13.8 01/07/2021   HCT 42.8 01/07/2021   MCV 86.8 01/07/2021   PLT 245 01/07/2021    Attestation Statements:  Reviewed by clinician on day of visit: allergies, medications, problem list, medical history, surgical history, family history, social history, and previous encounter notes.  Coral Ceo, CMA, am acting as transcriptionist for [employer], DO.  I have reviewed the above documentation for accuracy and completeness, and I agree with the above. Marjory Sneddon, D.O.  The [place] was signed into law in 2016 which includes the topic of electronic health records.  This provides immediate access to information in MyChart.  This includes consultation notes, operative notes, office notes, lab results and pathology reports.  If you have any questions about what you read please let us know at your next visit so we can discuss your concerns and take corrective action if need be.  We are right here with you.

## 2021-09-22 ENCOUNTER — Ambulatory Visit (INDEPENDENT_AMBULATORY_CARE_PROVIDER_SITE_OTHER): Payer: 59 | Admitting: Family Medicine

## 2021-09-29 ENCOUNTER — Ambulatory Visit (INDEPENDENT_AMBULATORY_CARE_PROVIDER_SITE_OTHER): Payer: 59 | Admitting: Family Medicine

## 2021-09-29 ENCOUNTER — Encounter (INDEPENDENT_AMBULATORY_CARE_PROVIDER_SITE_OTHER): Payer: Self-pay | Admitting: Family Medicine

## 2021-09-29 ENCOUNTER — Other Ambulatory Visit: Payer: Self-pay

## 2021-09-29 VITALS — BP 125/67 | HR 78 | Temp 97.8°F | Ht 70.0 in | Wt 321.0 lb

## 2021-09-29 DIAGNOSIS — E559 Vitamin D deficiency, unspecified: Secondary | ICD-10-CM | POA: Diagnosis not present

## 2021-09-29 DIAGNOSIS — Z6841 Body Mass Index (BMI) 40.0 and over, adult: Secondary | ICD-10-CM

## 2021-09-29 DIAGNOSIS — K5909 Other constipation: Secondary | ICD-10-CM

## 2021-09-29 DIAGNOSIS — I10 Essential (primary) hypertension: Secondary | ICD-10-CM

## 2021-09-29 NOTE — Patient Instructions (Signed)
The 10-year ASCVD risk score (Arnett DK, et al., 2019) is: 3.5%   Values used to calculate the score:     Age: 45 years     Sex: Female     Is Non-Hispanic African American: Yes     Diabetic: No     Tobacco smoker: No     Systolic Blood Pressure: 125 mmHg     Is BP treated: Yes     HDL Cholesterol: 37 mg/dL     Total Cholesterol: 153 mg/dL

## 2021-10-03 MED ORDER — VITAMIN D (ERGOCALCIFEROL) 1.25 MG (50000 UNIT) PO CAPS: 50000.0000 [IU] | ORAL_CAPSULE | ORAL | 0 refills | Status: DC

## 2021-10-03 NOTE — Progress Notes (Signed)
Chief Complaint:   OBESITY Julia Mccoy is here to discuss her progress with her obesity treatment plan along with follow-up of her obesity related diagnoses. Julia Mccoy is on the Category 2 Plan and keeping a food journal and adhering to recommended goals of 300-400 calories and 30+ grams of protein at lunch daily and states she is following her eating plan approximately 70% of the time. Julia Mccoy states she is walking for 15 minutes 3 times per week.  Today's visit was #: 8 Starting weight: 340 lbs Starting date: 04/20/2021 Today's weight: 321 lbs Today's date: 09/29/2021 Total lbs lost to date: 19 Total lbs lost since last in-office visit: 3  Interim History: Julia Mccoy has decreased her intake of calorie beverages for the past 4 weeks. She did not do as well with making her lunch 5 days per week, as she only made lunch 3 days per week. She tried to eat as much as she could.  Subjective:   1. Vitamin D deficiency Julia Mccoy is currently taking prescription vitamin D 50,000 IU each week. She denies nausea, vomiting or muscle weakness.  2. Other constipation Julia Mccoy's symptoms are controlled, and she has no issues or concerns.  3. Essential hypertension Julia Mccoy is taking Norvasc and Hyzaar. Cardiovascular ROS: no chest pain or dyspnea on exertion.  BP Readings from Last 3 Encounters:  09/29/21 125/67  09/01/21 118/81  08/08/21 (!) 144/79   Assessment/Plan:  No orders of the defined types were placed in this encounter.   Medications Discontinued During This Encounter  Medication Reason   Vitamin D, Ergocalciferol, (DRISDOL) 1.25 MG (50000 UNIT) CAPS capsule Reorder     Meds ordered this encounter  Medications   Vitamin D, Ergocalciferol, (DRISDOL) 1.25 MG (50000 UNIT) CAPS capsule    Sig: Take 1 capsule (50,000 Units total) by mouth every 7 (seven) days.    Dispense:  4 capsule    Refill:  0    30 d supply;  ** OV for RF **   Do not send RF request     1. Vitamin D  deficiency Julia Mccoy will continue prescription Vitamin D 50,000 IU every week, and we will refill for 1 month. She will follow-up for routine testing of Vitamin D, at least 2-3 times per year to avoid over-replacement.  - Vitamin D, Ergocalciferol, (DRISDOL) 1.25 MG (50000 UNIT) CAPS capsule; Take 1 capsule (50,000 Units total) by mouth every 7 (seven) days.  Dispense: 4 capsule; Refill: 0  2. Other constipation Julia Mccoy will continue Senokot as needed. She was informed that a decrease in bowel movement frequency is normal while losing weight, but stools should not be hard or painful. Orders and follow up as documented in patient record.   Counseling Getting to Good Bowel Health: Your goal is to have one soft bowel movement each day. Drink at least 8 glasses of water each day. Eat plenty of fiber (goal is over 25 grams each day). It is best to get most of your fiber from dietary sources which includes leafy green vegetables, fresh fruit, and whole grains. You may need to add fiber with the help of OTC fiber supplements. These include Metamucil, Citrucel, and Flaxseed. If you are still having trouble, try adding Miralax or Magnesium Citrate. If all of these changes do not work, Dietitian.  3. Essential hypertension Julia Mccoy's blood pressure is at goal today.  - Counseled Tish Frederickson on pathophysiology of disease and discussed treatment plan, which always includes dietary and lifestyle modification as  first line.  - Lifestyle changes such as following our low salt, heart healthy meal plan and engaging in a regular exercise program discussed  - Avoid buying foods that are: processed, frozen, or prepackaged to avoid excess salt. - Ambulatory blood pressure monitoring encouraged.  Reminded patient that if they ever feel poorly in any way, to check their blood pressure and pulse as well. - We will continue to monitor closely alongside PCP/ specialists.  Pt reminded to also f/up with those  individuals as instructed by them.  - We will continue to monitor symptoms as they relate to the her weight loss journey.  4. Obesity with current BMI of 46.1 Julia Mccoy is currently in the action stage of change. As such, her goal is to continue with weight loss efforts. She has agreed to the Category 2 Plan and keeping a food journal and adhering to recommended goals of 300-400 calories and 30+ grams of protein at lunch daily.   Julia Mccoy will come in one day next week between 730 AM-11 AM for fasting blood work only, so it may be reviewed.  Exercise goals: As is.  Behavioral modification strategies: holiday eating strategies  and planning for success.  Julia Mccoy has agreed to follow-up with our clinic in 3 to 4 weeks. She was informed of the importance of frequent follow-up visits to maximize her success with intensive lifestyle modifications for her multiple health conditions.   Objective:   Blood pressure 125/67, pulse 78, temperature 97.8 F (36.6 C), height 5\' 10"  (1.778 m), weight (!) 321 lb (145.6 kg), SpO2 99 %. Body mass index is 46.06 kg/m.  General: Cooperative, alert, well developed, in no acute distress. HEENT: Conjunctivae and lids unremarkable. Cardiovascular: Regular rhythm.  Lungs: Normal work of breathing. Neurologic: No focal deficits.   Lab Results  Component Value Date   CREATININE 0.70 05/03/2021   BUN 13 05/03/2021   NA 135 05/03/2021   K 3.9 05/03/2021   CL 101 05/03/2021   CO2 19 (L) 05/03/2021   Lab Results  Component Value Date   ALT 13 05/03/2021   AST 16 05/03/2021   ALKPHOS 60 05/03/2021   BILITOT 0.4 05/03/2021   Lab Results  Component Value Date   HGBA1C 5.6 05/03/2021   Lab Results  Component Value Date   INSULIN 22.4 05/03/2021   Lab Results  Component Value Date   TSH 2.860 05/03/2021   Lab Results  Component Value Date   CHOL 159 05/03/2021   HDL 37 (L) 05/03/2021   LDLCALC 108 (H) 05/03/2021   TRIG 69 05/03/2021   Lab  Results  Component Value Date   VD25OH 25.8 (L) 05/03/2021   Lab Results  Component Value Date   WBC 6.2 01/07/2021   HGB 13.8 01/07/2021   HCT 42.8 01/07/2021   MCV 86.8 01/07/2021   PLT 245 01/07/2021   No results found for: IRON, TIBC, FERRITIN  Attestation Statements:   Reviewed by clinician on day of visit: allergies, medications, problem list, medical history, surgical history, family history, social history, and previous encounter notes.   01/09/2021, am acting as transcriptionist for Trude Mcburney, DO.  I have reviewed the above documentation for accuracy and completeness, and I agree with the above. [employer], D.O.  The 21st Century Cures Act was signed into law in 2016 which includes the topic of electronic health records.  This provides immediate access to information in MyChart.  This includes consultation notes, operative notes, office notes,  lab results and pathology reports.  If you have any questions about what you read please let us know at your next visit so we can discuss your concerns and take corrective action if need be.  We are right here with you.

## 2021-10-27 ENCOUNTER — Ambulatory Visit (INDEPENDENT_AMBULATORY_CARE_PROVIDER_SITE_OTHER): Payer: 59 | Admitting: Family Medicine

## 2021-11-09 ENCOUNTER — Ambulatory Visit (INDEPENDENT_AMBULATORY_CARE_PROVIDER_SITE_OTHER): Payer: 59 | Admitting: Family Medicine

## 2021-11-21 ENCOUNTER — Encounter (HOSPITAL_BASED_OUTPATIENT_CLINIC_OR_DEPARTMENT_OTHER): Payer: Self-pay | Admitting: *Deleted

## 2021-11-21 ENCOUNTER — Emergency Department (HOSPITAL_BASED_OUTPATIENT_CLINIC_OR_DEPARTMENT_OTHER): Payer: Commercial Managed Care - PPO

## 2021-11-21 ENCOUNTER — Other Ambulatory Visit: Payer: Self-pay

## 2021-11-21 ENCOUNTER — Emergency Department (HOSPITAL_BASED_OUTPATIENT_CLINIC_OR_DEPARTMENT_OTHER)
Admission: EM | Admit: 2021-11-21 | Discharge: 2021-11-21 | Disposition: A | Discharge status: Home / Self Care | Payer: Commercial Managed Care - PPO | Attending: Emergency Medicine | Admitting: Emergency Medicine

## 2021-11-21 DIAGNOSIS — Z79899 Other long term (current) drug therapy: Secondary | ICD-10-CM | POA: Insufficient documentation

## 2021-11-21 DIAGNOSIS — R0789 Other chest pain: Secondary | ICD-10-CM | POA: Insufficient documentation

## 2021-11-21 DIAGNOSIS — R079 Chest pain, unspecified: Secondary | ICD-10-CM

## 2021-11-21 LAB — BASIC METABOLIC PANEL
Anion gap: 9 (ref 5–15)
BUN: 13 mg/dL (ref 6–20)
CO2: 24 mmol/L (ref 22–32)
Calcium: 8.6 mg/dL — ABNORMAL LOW (ref 8.9–10.3)
Chloride: 102 mmol/L (ref 98–111)
Creatinine, Ser: 0.68 mg/dL (ref 0.44–1.00)
GFR, Estimated: >60 mL/min (ref >60)
Glucose, Bld: 88 mg/dL (ref 70–99)
Potassium: 3 mmol/L — ABNORMAL LOW (ref 3.5–5.1)
Sodium: 135 mmol/L (ref 135–145)

## 2021-11-21 LAB — CBC
HCT: 38.9 % (ref 36.0–46.0)
Hemoglobin: 12.5 g/dL (ref 12.0–15.0)
MCH: 27.8 pg (ref 26.0–34.0)
MCHC: 32.1 g/dL (ref 30.0–36.0)
MCV: 86.4 fL (ref 80.0–100.0)
Platelets: 203 10*3/uL (ref 150–400)
RBC: 4.5 MIL/uL (ref 3.87–5.11)
RDW: 14.1 % (ref 11.5–15.5)
WBC: 4.9 10*3/uL (ref 4.0–10.5)
nRBC: 0 % (ref 0.0–0.2)

## 2021-11-21 LAB — TROPONIN I (HIGH SENSITIVITY)
Troponin I (High Sensitivity): 2 ng/L (ref <18)
Troponin I (High Sensitivity): 2 ng/L (ref <18)

## 2021-11-21 MED ORDER — POTASSIUM CHLORIDE CRYS ER 20 MEQ PO TBCR
40.0000 meq | EXTENDED_RELEASE_TABLET | Freq: Once | ORAL | Status: AC
  Administered 2021-11-21: 40 meq via ORAL
  Filled 2021-11-21: qty 2

## 2021-11-21 NOTE — ED Provider Notes (Signed)
[facility] EMERGENCY DEPARTMENT Provider Note   CSN: [id number] Arrival date & time: 11/21/21  1906     History  Chief Complaint  Patient presents with   Chest Pain    Julia Mccoy is a 46 y.o. female.  Patient presents chief complaint of chest pain describes a pressure-like sensation in the mid chest goes on and off since earlier today.  She took her anxiety medication without any improvement presents to the ER describes as a pressure-like ache worse when she pushes on her chest.  No associated fevers no cough no shortness of breath no palpitations no diaphoresis.      Home Medications Prior to Admission medications   Medication Sig Start Date End Date Taking? Authorizing Provider  acetaZOLAMIDE ER (DIAMOX) 500 MG capsule TAKE 1 CAPSULE BY MOUTH TWICE A DAY 08/30/21   Windell Norfolk, MD  amLODipine (NORVASC) 10 MG tablet Take 10 mg by mouth daily.    [provider]  busPIRone (BUSPAR) 10 MG tablet Take 10 mg by mouth 2 (two) times daily as needed. 11/27/19   [provider]  levothyroxine (SYNTHROID, LEVOTHROID) 88 MCG tablet Take 88 mcg by mouth daily.    [provider]  losartan-hydrochlorothiazide (HYZAAR) 50-12.5 MG per tablet Take 1 tablet by mouth daily.    [provider]  senna-docusate (SENOKOT-S) 8.6-50 MG tablet Take 1 tablet by mouth daily. 05/07/21   Sabas Sous, MD  Vitamin D, Ergocalciferol, (DRISDOL) 1.25 MG (50000 UNIT) CAPS capsule Take 1 capsule (50,000 Units total) by mouth every 7 (seven) days. 10/03/21   Thomasene Lot, DO      Allergies    Patient has no known allergies.    Review of Systems   Review of Systems  Constitutional:  Negative for fever.  HENT:  Negative for ear pain.   Eyes:  Negative for pain.  Respiratory:  Negative for cough.   Cardiovascular:  Positive for chest pain.  Gastrointestinal:  Negative for abdominal pain.  Genitourinary:  Negative for flank pain.  Musculoskeletal:   Negative for back pain.  Skin:  Negative for rash.  Neurological:  Negative for headaches.   Physical Exam Updated Vital Signs BP 127/66    Pulse 71    Temp 98.4 F (36.9 C) (Oral)    Resp 18    Ht 5\' 10"  (1.778 m)    Wt (!) 145.6 kg    LMP 11/21/2021    SpO2 100%    BMI 46.06 kg/m  Physical Exam Constitutional:      General: She is not in acute distress.    Appearance: Normal appearance.  HENT:     Head: Normocephalic.     Nose: Nose normal.  Eyes:     Extraocular Movements: Extraocular movements intact.  Cardiovascular:     Rate and Rhythm: Normal rate.  Pulmonary:     Effort: Pulmonary effort is normal.  Musculoskeletal:        General: Normal range of motion.     Cervical back: Normal range of motion.  Neurological:     General: No focal deficit present.     Mental Status: She is alert. Mental status is at baseline.    ED Results / Procedures / Treatments   Labs (all labs ordered are listed, but only abnormal results are displayed) Labs Reviewed  BASIC METABOLIC PANEL - Abnormal; Notable for the following components:      Result Value   Potassium 3.0 (*)    Calcium  8.6 (*)    All other components within normal limits  CBC  PREGNANCY, URINE  TROPONIN I (HIGH SENSITIVITY)  TROPONIN I (HIGH SENSITIVITY)    EKG None  Radiology DG Chest 2 View  Result Date: 11/21/2021 CLINICAL DATA:  Chest pressure EXAM: CHEST - 2 VIEW COMPARISON:  01/07/2021 FINDINGS: Heart and mediastinal contours are within normal limits. No focal opacities or effusions. No acute bony abnormality. IMPRESSION: No active cardiopulmonary disease. Electronically Signed   By: Charlett Nose M.D.   On: 11/21/2021 19:26    Procedures Procedures    Medications Ordered in ED Medications  potassium chloride SA (KLOR-CON M) CR tablet 40 mEq (40 mEq Oral Given 11/21/21 2149)    ED Course/ Medical Decision Making/ A&P                           Medical Decision Making Amount and/or Complexity of Data  Reviewed Labs: ordered. Radiology: ordered.  Risk Prescription drug management.   Review of records shows office visit September 29, 2021 for vitamin D deficiency.  Cardiac work-up.  Today EKG shows no ST elevations no ST depressions normal rate sinus rhythm.  Troponins are flat x2.  Pain is atypical with reproduction with palpation of the chest wall.  Work-up for acute coronary syndrome is negative.  Patient advised no strenuous activity rest at home and follow-up with cardiology within the week.  Recommending immediate return for worsening pain fevers or any additional concerns.        Final Clinical Impression(s) / ED Diagnoses Final diagnoses:  Nonspecific chest pain    Rx / DC Orders ED Discharge Orders     None         Cheryll Cockayne, MD 11/21/21 2256

## 2021-11-21 NOTE — ED Notes (Signed)
Written and verbal inst to pt  Verbalized an understanding  To home  

## 2021-11-21 NOTE — ED Triage Notes (Signed)
Chest pressure this afternoon. She has had pressure off and on since. She has a hx of anxiety and took an anxiety pill with no relief. EKG at triage.

## 2021-11-21 NOTE — Discharge Instructions (Signed)
Call your primary care doctor or specialist as discussed in the next 2-3 days.   Return immediately back to the ER if:  Your symptoms worsen within the next 12-24 hours. You develop new symptoms such as new fevers, persistent vomiting, new pain, shortness of breath, or new weakness or numbness, or if you have any other concerns.  

## 2022-02-02 ENCOUNTER — Ambulatory Visit: Payer: 59 | Admitting: Neurology

## 2022-02-09 ENCOUNTER — Ambulatory Visit: Payer: 59 | Admitting: Neurology

## 2022-02-14 ENCOUNTER — Encounter: Payer: Self-pay | Admitting: Neurology

## 2022-02-14 ENCOUNTER — Ambulatory Visit (INDEPENDENT_AMBULATORY_CARE_PROVIDER_SITE_OTHER): Payer: Commercial Managed Care - PPO | Admitting: Neurology

## 2022-02-14 VITALS — BP 130/79 | HR 82 | Ht 70.0 in | Wt 320.5 lb

## 2022-02-14 DIAGNOSIS — G932 Benign intracranial hypertension: Secondary | ICD-10-CM | POA: Diagnosis not present

## 2022-02-14 MED ORDER — ACETAZOLAMIDE ER 500 MG PO CP12: 500.0000 mg | ORAL_CAPSULE | Freq: Two times a day (BID) | ORAL | 11 refills | Status: DC

## 2022-02-14 NOTE — Progress Notes (Signed)
? ?[facility] ? ?PATIENT: Julia Mccoy ?DOB: 07/05/1976 ? ?REFERRING CLINICIAN: Bryon Lions, PA-C ?HISTORY FROM: Patient  ?REASON FOR VISIT: Headaches  ? ? ?HISTORICAL ? ?CHIEF COMPLAINT:  ?Chief Complaint  ?Patient presents with  ? Follow-up  ?  Rm 12. Alone. ?Headaches come in clusters, last approx 1 week. Worsens with changes in weather.  ? ?INTERVAL HISTORY 02/14/2022:  ?Patient presents today for follow-up, last visit was in November, at that time plan was to start Diamox 500 mg twice daily.  Patient has been taking the medication inconsistently.  Once taking the medication her headaches will resolve, she will stop the medication and developed headaches later, then restart the medication and after 5 to 7-days headache will resolve.  She did report following with an eye doctor and was told that everything was normal there was no evidence of pressure behind her eyes.  She does also report using her CPAP machine inconsistently.  She is working towards losing weight but she has been challenging for her.   ? ? ?HISTORY OF PRESENT ILLNESS:  ?This is a 46 year old woman with past medical history of hypertension, hypothyroidism, sleep apnea on CPAP< anxiety and obesity who is presenting for headache.  Patient stated headache started last year on April, she has pressure type headache on the left side of her forehead, sometimes behind eyes, and sometimes on the right side.  She presented to the ED in April 2021, had a CTA head and neck which was normal and her CT head showed evidence of empty sella.  She was not started on any medication but was told to follow-up with her primary care doctor.  Patient followed-up with her primary care, was started on Effexor but did not take the medication due to concern of side effect.  She still complains of occasional headache lasting 30 minutes to 1 hour, she takes acetaminophen which seemed to resolve the headache.  Last episode was 2 weeks ago.  There are  no associated nausea, no vomiting, no migrainous features.  She had a family history of migraine with her mother.   ? ? ?Headache History and Characteristics: ?Onset: April 2021, went to the ED  ?Location: All over  ?Quality: Pressure, aching   ?Intensity:7 /10.  ?Duration: last for about 1 hour ?Migrainous Features: No Photophobia, no phonophobia, no nausea/vomiting.  ?Aura: No  ?History of brain injury or tumor: No ? ?Family history: Mother with migraines  ?Motion sickness: no ?Cardiac history: no ? ?OTC: tylenol ?Sleep: Sleep is pretty good, will get about 6 hrs, has CPAP ?Mood/ Stress: ok ? ?Prior prophylaxis: ?Propranolol: No  ?Verapamil:No ?TCA: No ?Topamax: No ?Depakote: No ?Effexor: No ?Cymbalta: No ?Neurontin:No ? ?Prior abortives: ?Triptan: No ?Anti-emetic: No ?Steroids: No ?Ergotamine suppository: No ? ?OTHER MEDICAL CONDITIONS: HTN, Hypothyroidism, Sleep apnea, Anxiety  ? ? ?REVIEW OF SYSTEMS: Full 14 system review of systems performed and negative with exception of: as noted in the HPI  ? ?ALLERGIES: ?No Known Allergies ? ?HOME MEDICATIONS: ?Outpatient Medications Prior to Visit  ?Medication Sig Dispense Refill  ? amLODipine (NORVASC) 10 MG tablet Take 10 mg by mouth daily.    ? busPIRone (BUSPAR) 10 MG tablet Take 10 mg by mouth 2 (two) times daily as needed.    ? levothyroxine (SYNTHROID, LEVOTHROID) 88 MCG tablet Take 88 mcg by mouth daily.    ? losartan-hydrochlorothiazide (HYZAAR) 50-12.5 MG per tablet Take 1 tablet by mouth daily.    ? senna-docusate (SENOKOT-S) 8.6-50 MG tablet Take  1 tablet by mouth daily. 30 tablet 0  ? acetaZOLAMIDE ER (DIAMOX) 500 MG capsule TAKE 1 CAPSULE BY MOUTH TWICE A DAY 60 capsule 5  ? Vitamin D, Ergocalciferol, (DRISDOL) 1.25 MG (50000 UNIT) CAPS capsule Take 1 capsule (50,000 Units total) by mouth every 7 (seven) days. (Patient not taking: Reported on 02/14/2022) 4 capsule 0  ? ?No facility-administered medications prior to visit.  ? ? ?PAST MEDICAL HISTORY: ?Past  Medical History:  ?Diagnosis Date  ? Anxiety   ? Back pain   ? Chest pain   ? Constipation   ? Depression   ? Edema of both lower extremities   ? GERD (gastroesophageal reflux disease)   ? Hypertension   ? Hypothyroidism   ? Joint pain   ? Obesity   ? Palpitations   ? Pre-diabetes   ? Sleep apnea   ? ? ?PAST SURGICAL HISTORY: ?Past Surgical History:  ?Procedure Laterality Date  ? KELOID EXCISION    ? ? ?FAMILY HISTORY: ?Family History  ?Problem Relation Age of Onset  ? Hypertension Mother   ? Hyperlipidemia Mother   ? Depression Mother   ? Hypertension Father   ? Hyperlipidemia Father   ? Diabetes Father   ? Cancer Father   ? Heart attack Cousin   ? ? ?SOCIAL HISTORY: ?Social History  ? ?Socioeconomic History  ? Marital status: Single  ?  Spouse name: Not on file  ? Number of children: Not on file  ? Years of education: Not on file  ? Highest education level: Not on file  ?Occupational History  ? 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 Smoking status: Never  ? Smokeless tobacco: Never  ?Vaping Use  ? Vaping Use: Never used  ?Substance and Sexual Activity  ? Alcohol use: No  ? Drug use: No  ? Sexual activity: Not on file  ?Other Topics Concern  ? Not on file  ?Social History Narrative  ? Not on file  ? ?Social Determinants of Health  ? ?Financial Resource Strain: Not on file  ?Food Insecurity: Not on file  ?Transportation Needs: Not on file  ?Physical Activity: Not on file  ?Stress: Not on file  ?Social Connections: Not on file  ?Intimate Partner Violence: Not on file  ? ? ? ?PHYSICAL EXAM ? ?GENERAL EXAM/CONSTITUTIONAL: ?Vitals:  ?Vitals:  ? 02/14/22 0919  ?BP: 130/79  ?Pulse: 82  ?Weight: (!) 320 lb 8 oz (145.4 kg)  ?Height: 5\' 10"  (1.778 m)  ? ? ?Body mass index is 45.99 kg/m?. ?Wt Readings from Last 3 Encounters:  ?02/14/22 (!) 320 lb 8 oz (145.4 kg)  ?11/21/21 (!) 320 lb 15.8 oz (145.6 kg)  ?09/29/21 (!) 321 lb (145.6 kg)  ? ?Patient is in no distress; well developed, nourished and groomed; neck is  supple ? ?EYES: ?Pupils round and reactive to light, Visual fields full to confrontation, Extraocular movements intacts,  ? ?MUSCULOSKELETAL: ?Gait, strength, tone, movements noted in Neurologic exam below ? ?NEUROLOGIC: ?MENTAL STATUS:  ?   ? View : No data to display.  ?  ?  ?  ? ?awake, alert, oriented to person, place and time ?recent and remote memory intact ?normal attention and concentration ?language fluent, comprehension intact, naming intact ?fund of knowledge appropriate ? ?CRANIAL NERVE:  ?2nd, 3rd, 4th, 6th - pupils equal and reactive to light, visual fields full to confrontation, extraocular muscles intact, no nystagmus ?5th - facial sensation symmetric ?7th - facial strength symmetric ?8th - hearing intact ?  9th - palate elevates symmetrically, uvula midline ?11th - shoulder shrug symmetric ?12th - tongue protrusion midline ? ?MOTOR:  ?normal bulk and tone, full strength in the BUE, BLE ? ?SENSORY:  ?normal and symmetric to light touch, pinprick, temperature, vibration ? ?COORDINATION:  ?finger-nose-finger, fine finger movements normal ? ?REFLEXES:  ?deep tendon reflexes present and symmetric ? ?GAIT/STATION:  ?normal ? ? ? ?DIAGNOSTIC DATA (LABS, IMAGING, TESTING) ?- I reviewed patient records, labs, notes, testing and imaging myself where available. ? ?Lab Results  ?Component Value Date  ? WBC 4.9 11/21/2021  ? HGB 12.5 11/21/2021  ? HCT 38.9 11/21/2021  ? MCV 86.4 11/21/2021  ? PLT 203 11/21/2021  ? ?   ?Component Value Date/Time  ? NA 135 11/21/2021 2010  ? NA 135 05/03/2021 1342  ? K 3.0 (L) 11/21/2021 2010  ? CL 102 11/21/2021 2010  ? CO2 24 11/21/2021 2010  ? GLUCOSE 88 11/21/2021 2010  ? BUN 13 11/21/2021 2010  ? BUN 13 05/03/2021 1342  ? CREATININE 0.68 11/21/2021 2010  ? CALCIUM 8.6 (L) 11/21/2021 2010  ? PROT 7.6 05/03/2021 1342  ? ALBUMIN 4.2 05/03/2021 1342  ? AST 16 05/03/2021 1342  ? ALT 13 05/03/2021 1342  ? ALKPHOS 60 05/03/2021 1342  ? BILITOT 0.4 05/03/2021 1342  ? GFRNONAA >60  11/21/2021 2010  ? GFRAA >60 02/03/2020 1853  ? ?Lab Results  ?Component Value Date  ? CHOL 159 05/03/2021  ? HDL 37 (L) 05/03/2021  ? LDLCALC 108 (H) 05/03/2021  ? TRIG 69 05/03/2021  ? ?Lab Results  ?Compon

## 2022-02-14 NOTE — Patient Instructions (Signed)
Continue Diamox 500 mg twice daily, refill given ?Follow-up with your other doctors ?Use CPAP machine nightly ?Follow-up with 67-months ?

## 2022-05-15 ENCOUNTER — Ambulatory Visit (INDEPENDENT_AMBULATORY_CARE_PROVIDER_SITE_OTHER): Payer: Commercial Managed Care - PPO | Admitting: Nurse Practitioner

## 2022-05-15 ENCOUNTER — Encounter (INDEPENDENT_AMBULATORY_CARE_PROVIDER_SITE_OTHER): Payer: Self-pay | Admitting: Nurse Practitioner

## 2022-05-15 VITALS — HR 62 | Temp 98.0°F | Ht 70.0 in | Wt 322.0 lb

## 2022-05-15 DIAGNOSIS — E559 Vitamin D deficiency, unspecified: Secondary | ICD-10-CM | POA: Diagnosis not present

## 2022-05-15 DIAGNOSIS — E669 Obesity, unspecified: Secondary | ICD-10-CM

## 2022-05-15 DIAGNOSIS — Z6841 Body Mass Index (BMI) 40.0 and over, adult: Secondary | ICD-10-CM

## 2022-05-15 DIAGNOSIS — I10 Essential (primary) hypertension: Secondary | ICD-10-CM | POA: Diagnosis not present

## 2022-05-17 NOTE — Progress Notes (Signed)
Chief Complaint:   OBESITY Julia Mccoy is here to discuss her progress with her obesity treatment plan along with follow-up of her obesity related diagnoses. Julia Mccoy is on the Category 2 Plan with 300-400 calories and 30 grams of protein at lunch and states she is following her eating plan approximately 30% of the time. Julia Mccoy states she is walking 15-20 minutes 5 times per week.  Today's visit was #: 9 Starting weight: 340 lbs Starting date: 04/20/2021 Today's weight: 322 lbs Today's date: 05/15/2022 Total lbs lost to date: 18 lbs Total lbs lost since last in-office visit: 0  Interim History: Julia Mccoy was seen here last on 09/29/21. She is eating 2 meals and 1 snack per day. Struggling with sweet cravings. She eats out 4-5 days per week. Drinking water, soda, and tea.  Subjective:   1. Vitamin D insufficiency Julia Mccoy not currently taking any over the counter medication.  2. Essential hypertension Julia Mccoy is currently taking Norvasc 10 mg and Hyzaar 50-12 mg. Denies any chest pain,shortness of breath or palpitations.  Assessment/Plan:   1. Vitamin D insufficiency We will obtain labs at next visit.  2. Essential hypertension Julia Mccoy will continue to follow up with PCP and continue medications as directed. Julia Mccoy is working on healthy weight loss and exercise to improve blood pressure control. We will watch for signs of hypotension as she continues her lifestyle modifications.  3. Obesity with current BMI of 46.2 Julia Mccoy is currently in the action stage of change. As such, her goal is to continue with weight loss efforts. She has agreed to keeping a food journal and adhering to recommended goals of 1200-1300 calories and 85+grams of protein.   We will obtain IC and labs at next visit.  Exercise goals: As is.  Behavioral modification strategies: increasing lean protein intake, increasing water intake, planning for success, and keeping a strict food journal.  Julia Mccoy has  agreed to follow-up with our clinic in 2 weeks. She was informed of the importance of frequent follow-up visits to maximize her success with intensive lifestyle modifications for her multiple health conditions.   Objective:   Pulse 62, temperature 98 F (36.7 C), height 5\' 10"  (1.778 m), weight (!) 322 lb (146.1 kg), SpO2 100 %. Body mass index is 46.2 kg/m.  General: Cooperative, alert, well developed, in no acute distress. HEENT: Conjunctivae and lids unremarkable. Cardiovascular: Regular rhythm.  Lungs: Normal work of breathing. Neurologic: No focal deficits.   Lab Results  Component Value Date   CREATININE 0.68 11/21/2021   BUN 13 11/21/2021   NA 135 11/21/2021   K 3.0 (L) 11/21/2021   CL 102 11/21/2021   CO2 24 11/21/2021   Lab Results  Component Value Date   ALT 13 05/03/2021   AST 16 05/03/2021   ALKPHOS 60 05/03/2021   BILITOT 0.4 05/03/2021   Lab Results  Component Value Date   HGBA1C 5.6 05/03/2021   Lab Results  Component Value Date   INSULIN 22.4 05/03/2021   Lab Results  Component Value Date   TSH 2.860 05/03/2021   Lab Results  Component Value Date   CHOL 159 05/03/2021   HDL 37 (L) 05/03/2021   LDLCALC 108 (H) 05/03/2021   TRIG 69 05/03/2021   Lab Results  Component Value Date   VD25OH 25.8 (L) 05/03/2021   Lab Results  Component Value Date   WBC 4.9 11/21/2021   HGB 12.5 11/21/2021   HCT 38.9 11/21/2021   MCV 86.4 11/21/2021   PLT  203 11/21/2021   No results found for: "IRON", "TIBC", "FERRITIN"  Attestation Statements:   Reviewed by clinician on day of visit: allergies, medications, problem list, medical history, surgical history, family history, social history, and previous encounter notes.  Time spent on visit including pre-visit chart review and post-visit care and charting was 30 minutes.   I, Brendell Tyus, RMA, am acting as transcriptionist for Irene Limbo, FNP.  I have reviewed the above documentation for  accuracy and completeness, and I agree with the above. Irene Limbo, FNP

## 2022-05-24 ENCOUNTER — Encounter (INDEPENDENT_AMBULATORY_CARE_PROVIDER_SITE_OTHER): Payer: Self-pay

## 2022-06-07 ENCOUNTER — Encounter (INDEPENDENT_AMBULATORY_CARE_PROVIDER_SITE_OTHER): Payer: Self-pay | Admitting: Nurse Practitioner

## 2022-06-07 ENCOUNTER — Ambulatory Visit (INDEPENDENT_AMBULATORY_CARE_PROVIDER_SITE_OTHER): Payer: Commercial Managed Care - PPO | Admitting: Nurse Practitioner

## 2022-06-07 VITALS — BP 115/75 | HR 68 | Temp 98.8°F | Ht 70.0 in | Wt 323.0 lb

## 2022-06-07 DIAGNOSIS — R7303 Prediabetes: Secondary | ICD-10-CM | POA: Diagnosis not present

## 2022-06-07 DIAGNOSIS — G4733 Obstructive sleep apnea (adult) (pediatric): Secondary | ICD-10-CM | POA: Diagnosis not present

## 2022-06-07 DIAGNOSIS — E669 Obesity, unspecified: Secondary | ICD-10-CM

## 2022-06-07 DIAGNOSIS — E559 Vitamin D deficiency, unspecified: Secondary | ICD-10-CM

## 2022-06-07 DIAGNOSIS — R5383 Other fatigue: Secondary | ICD-10-CM | POA: Diagnosis not present

## 2022-06-07 DIAGNOSIS — Z6841 Body Mass Index (BMI) 40.0 and over, adult: Secondary | ICD-10-CM

## 2022-06-07 DIAGNOSIS — E7849 Other hyperlipidemia: Secondary | ICD-10-CM | POA: Diagnosis not present

## 2022-06-08 LAB — CBC WITH DIFFERENTIAL/PLATELET
Basophils Absolute: 0 10*3/uL (ref 0.0–0.2)
Basos: 0 %
EOS (ABSOLUTE): 0.2 10*3/uL (ref 0.0–0.4)
Eos: 4 %
Hematocrit: 38.7 % (ref 34.0–46.6)
Hemoglobin: 12.1 g/dL (ref 11.1–15.9)
Immature Grans (Abs): 0 10*3/uL (ref 0.0–0.1)
Immature Granulocytes: 0 %
Lymphocytes Absolute: 1.9 10*3/uL (ref 0.7–3.1)
Lymphs: 40 %
MCH: 27.6 pg (ref 26.6–33.0)
MCHC: 31.3 g/dL — ABNORMAL LOW (ref 31.5–35.7)
MCV: 88 fL (ref 79–97)
Monocytes Absolute: 0.3 10*3/uL (ref 0.1–0.9)
Monocytes: 7 %
Neutrophils Absolute: 2.3 10*3/uL (ref 1.4–7.0)
Neutrophils: 49 %
Platelets: 189 10*3/uL (ref 150–450)
RBC: 4.39 x10E6/uL (ref 3.77–5.28)
RDW: 12.7 % (ref 11.7–15.4)
WBC: 4.7 10*3/uL (ref 3.4–10.8)

## 2022-06-08 LAB — COMPREHENSIVE METABOLIC PANEL
ALT: 14 IU/L (ref 0–32)
AST: 14 IU/L (ref 0–40)
Albumin/Globulin Ratio: 1.3 (ref 1.2–2.2)
Albumin: 4.3 g/dL (ref 3.9–4.9)
Alkaline Phosphatase: 59 IU/L (ref 44–121)
BUN/Creatinine Ratio: 17 (ref 9–23)
BUN: 13 mg/dL (ref 6–24)
Bilirubin Total: 0.5 mg/dL (ref 0.0–1.2)
CO2: 20 mmol/L (ref 20–29)
Calcium: 9.3 mg/dL (ref 8.7–10.2)
Chloride: 105 mmol/L (ref 96–106)
Creatinine, Ser: 0.78 mg/dL (ref 0.57–1.00)
Globulin, Total: 3.3 g/dL (ref 1.5–4.5)
Glucose: 101 mg/dL — ABNORMAL HIGH (ref 70–99)
Potassium: 3.6 mmol/L (ref 3.5–5.2)
Sodium: 137 mmol/L (ref 134–144)
Total Protein: 7.6 g/dL (ref 6.0–8.5)
eGFR: 95 mL/min/{1.73_m2} (ref >59)

## 2022-06-08 LAB — T3: T3, Total: 81 ng/dL (ref 71–180)

## 2022-06-08 LAB — INSULIN, RANDOM: INSULIN: 15.1 u[IU]/mL (ref 2.6–24.9)

## 2022-06-08 LAB — HEMOGLOBIN A1C
Est. average glucose Bld gHb Est-mCnc: 111 mg/dL
Hgb A1c MFr Bld: 5.5 % (ref 4.8–5.6)

## 2022-06-08 LAB — VITAMIN B12: Vitamin B-12: 514 pg/mL (ref 232–1245)

## 2022-06-08 LAB — T4, FREE: Free T4: 0.94 ng/dL (ref 0.82–1.77)

## 2022-06-08 LAB — VITAMIN D 25 HYDROXY (VIT D DEFICIENCY, FRACTURES): Vit D, 25-Hydroxy: 24.2 ng/mL — ABNORMAL LOW (ref 30.0–100.0)

## 2022-06-08 LAB — LIPID PANEL WITH LDL/HDL RATIO
Cholesterol, Total: 171 mg/dL (ref 100–199)
HDL: 44 mg/dL (ref >39)
LDL Chol Calc (NIH): 112 mg/dL — ABNORMAL HIGH (ref 0–99)
LDL/HDL Ratio: 2.5 ratio (ref 0.0–3.2)
Triglycerides: 80 mg/dL (ref 0–149)
VLDL Cholesterol Cal: 15 mg/dL (ref 5–40)

## 2022-06-08 LAB — FOLATE: Folate: 7.2 ng/mL (ref >3.0)

## 2022-06-08 LAB — TSH: TSH: 2.38 u[IU]/mL (ref 0.450–4.500)

## 2022-06-08 LAB — FERRITIN: Ferritin: 216 ng/mL — ABNORMAL HIGH (ref 15–150)

## 2022-06-14 NOTE — Progress Notes (Unsigned)
Chief Complaint:   OBESITY Julia Mccoy is here to discuss her progress with her obesity treatment plan along with follow-up of her obesity related diagnoses. Julia Mccoy is on the Category 2 Plan and keeping a food journal and adhering to recommended goals of 300-400 calories and 30 grams of  protein and states she is following her eating plan approximately 40% of the time. Julia Mccoy states she is exercising 0 minutes 0 times per week.  Today's visit was #: 10 Starting weight: 340 lbs Starting date: 04/20/2021 Today's weight: 323 lbs Today's date: 06/07/2022 Total lbs lost to date: 17 lbs Total lbs lost since last in-office visit: 0  Interim History: Julia Mccoy is eating 2 meals and sometimes a snack. Snacks: yogurt, crackers, fruit. Breakfast: 3 eggs, cheeses, veggies and meat. Lunch: eats 3-4 days per week/eating out. Dinner: protein, veggies and carb. Drinks water, tea and soda.  Subjective:   1. Fatigue, unspecified type Julia Mccoy is struggling with fatigue times 1 month. No new medications. Reports heavy menses. Unsure of anemia. Goes to bed 11:30-MN-8:30-0900 am. Wearing CPAP nightly. Wakes self up gasping for breath,  2. Obstructive sleep apnea syndrome Julia Mccoy is wearing CPAP nightly. See fatigue note. Saw pulmonary last on 06/18/2019.  3. Other hyperlipidemia Julia Mccoy has never been on medication. Family history: mother and father.  4. Pre-diabetes Julia Mccoy has never been on medication. Family history: father DMT2 and son prediabetes.  Assessment/Plan:   1. Fatigue, unspecified type We will obtain labs today.  - Comprehensive metabolic panel - Ferritin - Folate - CBC with Differential/Platelet - TSH - T4, free - T3 - Vitamin B12 - VITAMIN D 25 Hydroxy (Vit-D Deficiency, Fractures)  2. Obstructive sleep apnea syndrome Refer back to pulmonary.  3. Other hyperlipidemia Cardiovascular risk and specific lipid/LDL goals reviewed.  We discussed several lifestyle modifications  today and Lulani will continue to work on diet, exercise and weight loss efforts. Orders and follow up as documented in patient record.   Counseling Intensive lifestyle modifications are the first line treatment for this issue. Dietary changes: Increase soluble fiber. Decrease simple carbohydrates. Exercise changes: Moderate to vigorous-intensity aerobic activity 150 minutes per week if tolerated. Lipid-lowering medications: see documented in medical record.    4. Pre-diabetes We will obtain labs today. Julia Mccoy will continue to work on weight loss, exercise, and decreasing simple carbohydrates to help decrease the risk of diabetes.    - Hemoglobin A1c - Insulin, random  5. Obesity with current BMI of 46.5 Julia Mccoy is currently in the action stage of change. As such, her goal is to continue with weight loss efforts. She has agreed to the Category 2 Plan.   Exercise goals: All adults should avoid inactivity. Some physical activity is better than none, and adults who participate in any amount of physical activity gain some health benefits.  Julia Mccoy plans to join gym.  Behavioral modification strategies: increasing lean protein intake, increasing vegetables, increasing water intake, and keeping a strict food journal.  Julia Mccoy has agreed to follow-up with our clinic in 2 weeks. She was informed of the importance of frequent follow-up visits to maximize her success with intensive lifestyle modifications for her multiple health conditions.   Julia Mccoy was informed we would discuss her lab results at her next visit unless there is a critical issue that needs to be addressed sooner. Julia Mccoy agreed to keep her next visit at the agreed upon time to discuss these results.  Objective:   Blood pressure 115/75, pulse 68, temperature 98.8 F (37.1  C), height 5\' 10"  (1.778 m), weight (!) 323 lb (146.5 kg), SpO2 99 %. Body mass index is 46.35 kg/m.  General: Cooperative, alert, well developed, in no  acute distress. HEENT: Conjunctivae and lids unremarkable. Cardiovascular: Regular rhythm.  Lungs: Normal work of breathing. Neurologic: No focal deficits.   Lab Results  Component Value Date   CREATININE 0.78 06/07/2022   BUN 13 06/07/2022   NA 137 06/07/2022   K 3.6 06/07/2022   CL 105 06/07/2022   CO2 20 06/07/2022   Lab Results  Component Value Date   ALT 14 06/07/2022   AST 14 06/07/2022   ALKPHOS 59 06/07/2022   BILITOT 0.5 06/07/2022   Lab Results  Component Value Date   HGBA1C 5.5 06/07/2022   HGBA1C 5.6 05/03/2021   Lab Results  Component Value Date   INSULIN 15.1 06/07/2022   INSULIN 22.4 05/03/2021   Lab Results  Component Value Date   TSH 2.380 06/07/2022   Lab Results  Component Value Date   CHOL 171 06/07/2022   HDL 44 06/07/2022   LDLCALC 112 (H) 06/07/2022   TRIG 80 06/07/2022   Lab Results  Component Value Date   VD25OH 24.2 (L) 06/07/2022   VD25OH 25.8 (L) 05/03/2021   Lab Results  Component Value Date   WBC 4.7 06/07/2022   HGB 12.1 06/07/2022   HCT 38.7 06/07/2022   MCV 88 06/07/2022   PLT 189 06/07/2022   Lab Results  Component Value Date   FERRITIN 216 (H) 06/07/2022   Attestation Statements:   Reviewed by clinician on day of visit: allergies, medications, problem list, medical history, surgical history, family history, social history, and previous encounter notes.  I, Brendell Tyus, RMA, am acting as transcriptionist for 06/09/2022, FNP.  I have reviewed the above documentation for accuracy and completeness, and I agree with the above. Irene Limbo, FNP

## 2022-06-26 ENCOUNTER — Encounter (INDEPENDENT_AMBULATORY_CARE_PROVIDER_SITE_OTHER): Payer: Self-pay | Admitting: Family Medicine

## 2022-06-26 ENCOUNTER — Ambulatory Visit (INDEPENDENT_AMBULATORY_CARE_PROVIDER_SITE_OTHER): Payer: Commercial Managed Care - PPO | Admitting: Family Medicine

## 2022-06-26 VITALS — BP 112/71 | HR 63 | Temp 98.1°F | Ht 70.0 in | Wt 323.0 lb

## 2022-06-26 DIAGNOSIS — F419 Anxiety disorder, unspecified: Secondary | ICD-10-CM | POA: Diagnosis not present

## 2022-06-26 DIAGNOSIS — E559 Vitamin D deficiency, unspecified: Secondary | ICD-10-CM | POA: Diagnosis not present

## 2022-06-26 DIAGNOSIS — E669 Obesity, unspecified: Secondary | ICD-10-CM

## 2022-06-26 DIAGNOSIS — Z6841 Body Mass Index (BMI) 40.0 and over, adult: Secondary | ICD-10-CM

## 2022-06-26 MED ORDER — ESCITALOPRAM OXALATE 10 MG PO TABS: 10.0000 mg | ORAL_TABLET | Freq: Every day | ORAL | 0 refills | Status: DC

## 2022-06-26 MED ORDER — VITAMIN D (ERGOCALCIFEROL) 1.25 MG (50000 UNIT) PO CAPS: 50000.0000 [IU] | ORAL_CAPSULE | ORAL | 0 refills | Status: DC

## 2022-06-28 ENCOUNTER — Other Ambulatory Visit: Payer: Self-pay

## 2022-06-28 MED ORDER — ACETAZOLAMIDE ER 500 MG PO CP12: 500.0000 mg | ORAL_CAPSULE | Freq: Two times a day (BID) | ORAL | 1 refills | Status: DC

## 2022-06-30 NOTE — Progress Notes (Unsigned)
Chief Complaint:   OBESITY Julia Mccoy is here to discuss her progress with her obesity treatment plan along with follow-up of her obesity related diagnoses. Julia Mccoy is on the Category 2 Plan and states she is following her eating plan approximately 75% of the time. Julia Mccoy states she is walking for 20-30 minutes 3-4 times per week.  Today's visit was #: 11 Starting weight: 340 lbs Starting date: 04/20/2021 Today's weight: 323 lbs Today's date: 06/26/2022 Total lbs lost to date: 17 Total lbs lost since last in-office visit: 0  Interim History: Julia Mccoy has done well with maintaining her weight.  She notes eating out and doing more grab and go eating.  She notes some increase in stress.  Her hunger is a bit of an issue as well.  Subjective:   1. Anxiety Julia Mccoy is on BuSpar, but she notes it is not controlling her symptoms as well as it used to.  She notes some increased emotional eating, which may be related to anxiety.  2. Vitamin D deficiency Julia Mccoy is stable on vitamin D, and she denies nausea, vomiting, or muscle weakness.  She requests a refill today.  Assessment/Plan:   1. Anxiety Julia Mccoy will continue BuSpar, and she agreed to start Lexapro 10 mg once daily with no refills.  - escitalopram (LEXAPRO) 10 MG tablet; Take 1 tablet (10 mg total) by mouth daily.  Dispense: 30 tablet; Refill: 0  2. Vitamin D deficiency We will refill prescription Vitamin D for 1 month. Julia Mccoy will follow-up for routine testing of Vitamin D, at least 2-3 times per year to avoid over-replacement.  - Vitamin D, Ergocalciferol, (DRISDOL) 1.25 MG (50000 UNIT) CAPS capsule; Take 1 capsule (50,000 Units total) by mouth every 7 (seven) days.  Dispense: 4 capsule; Refill: 0  3. Obesity, Current BMI 46.4 Julia Mccoy is currently in the action stage of change. As such, her goal is to continue with weight loss efforts. She has agreed to the Category 2 Plan.   Exercise goals: As is.   Behavioral modification  strategies: increasing lean protein intake.  Julia Mccoy has agreed to follow-up with our clinic in 2 to 3 weeks. She was informed of the importance of frequent follow-up visits to maximize her success with intensive lifestyle modifications for her multiple health conditions.   Objective:   Blood pressure 112/71, pulse 63, temperature 98.1 F (36.7 C), height 5\' 10"  (1.778 m), weight (!) 323 lb (146.5 kg), SpO2 98 %. Body mass index is 46.35 kg/m.  General: Cooperative, alert, well developed, in no acute distress. HEENT: Conjunctivae and lids unremarkable. Cardiovascular: Regular rhythm.  Lungs: Normal work of breathing. Neurologic: No focal deficits.   Lab Results  Component Value Date   CREATININE 0.78 06/07/2022   BUN 13 06/07/2022   NA 137 06/07/2022   K 3.6 06/07/2022   CL 105 06/07/2022   CO2 20 06/07/2022   Lab Results  Component Value Date   ALT 14 06/07/2022   AST 14 06/07/2022   ALKPHOS 59 06/07/2022   BILITOT 0.5 06/07/2022   Lab Results  Component Value Date   HGBA1C 5.5 06/07/2022   HGBA1C 5.6 05/03/2021   Lab Results  Component Value Date   INSULIN 15.1 06/07/2022   INSULIN 22.4 05/03/2021   Lab Results  Component Value Date   TSH 2.380 06/07/2022   Lab Results  Component Value Date   CHOL 171 06/07/2022   HDL 44 06/07/2022   LDLCALC 112 (H) 06/07/2022   TRIG 80 06/07/2022  Lab Results  Component Value Date   VD25OH 24.2 (L) 06/07/2022   VD25OH 25.8 (L) 05/03/2021   Lab Results  Component Value Date   WBC 4.7 06/07/2022   HGB 12.1 06/07/2022   HCT 38.7 06/07/2022   MCV 88 06/07/2022   PLT 189 06/07/2022   Lab Results  Component Value Date   FERRITIN 216 (H) 06/07/2022   Attestation Statements:   Reviewed by clinician on day of visit: allergies, medications, problem list, medical history, surgical history, family history, social history, and previous encounter notes.   I, Burt Knack, am acting as transcriptionist for Quillian Quince, MD.  I have reviewed the above documentation for accuracy and completeness, and I agree with the above. -  Quillian Quince, MD

## 2022-07-17 ENCOUNTER — Encounter: Payer: Self-pay | Admitting: Nurse Practitioner

## 2022-07-17 ENCOUNTER — Ambulatory Visit (INDEPENDENT_AMBULATORY_CARE_PROVIDER_SITE_OTHER): Payer: Commercial Managed Care - PPO | Admitting: Nurse Practitioner

## 2022-07-17 VITALS — BP 114/77 | HR 63 | Temp 97.6°F | Ht 70.0 in | Wt 325.0 lb

## 2022-07-17 DIAGNOSIS — E669 Obesity, unspecified: Secondary | ICD-10-CM | POA: Diagnosis not present

## 2022-07-17 DIAGNOSIS — F419 Anxiety disorder, unspecified: Secondary | ICD-10-CM | POA: Diagnosis not present

## 2022-07-17 DIAGNOSIS — E66813 Obesity, class 3: Secondary | ICD-10-CM

## 2022-07-17 DIAGNOSIS — E559 Vitamin D deficiency, unspecified: Secondary | ICD-10-CM

## 2022-07-17 DIAGNOSIS — Z6841 Body Mass Index (BMI) 40.0 and over, adult: Secondary | ICD-10-CM | POA: Diagnosis not present

## 2022-07-17 NOTE — Progress Notes (Signed)
Chief Complaint:   OBESITY Julia Mccoy is here to discuss her progress with her obesity treatment plan along with follow-up of her obesity related diagnoses. [name] is on the Category 2 Plan and states she is following her eating plan approximately 60% of the time. [name] states she is walking the dog 20-30 minutes 3 times per week.  Today's visit was #: 11 Starting weight: 340 lbs Starting date: 04/20/2021 Today's weight: 325 lbs Today's date: 07/17/2022 Total lbs lost to date: 15 lbs Total lbs lost since last in-office visit: 0  Interim History: Julia Mccoy had a "good weekend eating". She has eaten out more and has had some changes at work and been stress eating some. Notes some hunger and cravings. Drinking water, tea and sodas.  Subjective:   1. Anxiety [name] never started Lexapro-was prescribed after her last visit. She was hesitant to start a new medication. Notes some stress and has been stress eating.  2. Vitamin D deficiency Julia Mccoy started taking Vit D 50,000 IU last week. Denies any side effects.  Denies nausea, vomiting or muscle weakness.  Assessment/Plan:   1. Anxiety Will consider starting Lexapro . We will discuss again during next visit.  Side effects and benefits discussed.     2. Vitamin D deficiency Continue taking Vit D 50,000 IU weekly.  Side effects discussed.   Low Vitamin D level contributes to fatigue and are associated with obesity, breast, and colon cancer. She agrees to continue to take prescription Vitamin D @50 ,000 IU every week and will follow-up for routine testing of Vitamin D, at least 2-3 times per year to avoid over-replacement.   3. Obesity, Current BMI 46.7 Julia Mccoy is currently in the action stage of change. As such, her goal is to continue with weight loss efforts. She has agreed to keeping a food journal and adhering to recommended goals of 1200-1300 calories and 75+ grams of protein.   Exercise goals: As is.  Handouts given: 100-200  calorie snacks. Goals are: meal planning.  Behavioral modification strategies: increasing lean protein intake, increasing vegetables, increasing water intake, planning for success, and keeping a strict food journal.  Julia Mccoy has agreed to follow-up with our clinic in 3 weeks. She was informed of the importance of frequent follow-up visits to maximize her success with intensive lifestyle modifications for her multiple health conditions.   Objective:   Blood pressure 114/77, pulse 63, temperature 97.6 F (36.4 C), temperature source Oral, height 5\' 10"  (1.778 m), weight (!) 325 lb (147.4 kg), SpO2 98 %. Body mass index is 46.63 kg/m.  General: Cooperative, alert, well developed, in no acute distress. HEENT: Conjunctivae and lids unremarkable. Cardiovascular: Regular rhythm.  Lungs: Normal work of breathing. Neurologic: No focal deficits.   Lab Results  Component Value Date   CREATININE 0.78 06/07/2022   BUN 13 06/07/2022   NA 137 06/07/2022   K 3.6 06/07/2022   CL 105 06/07/2022   CO2 20 06/07/2022   Lab Results  Component Value Date   ALT 14 06/07/2022   AST 14 06/07/2022   ALKPHOS 59 06/07/2022   BILITOT 0.5 06/07/2022   Lab Results  Component Value Date   HGBA1C 5.5 06/07/2022   HGBA1C 5.6 05/03/2021   Lab Results  Component Value Date   INSULIN 15.1 06/07/2022   INSULIN 22.4 05/03/2021   Lab Results  Component Value Date   TSH 2.380 06/07/2022   Lab Results  Component Value Date   CHOL 171 06/07/2022   HDL 44  06/07/2022   LDLCALC 112 (H) 06/07/2022   TRIG 80 06/07/2022   Lab Results  Component Value Date   VD25OH 24.2 (L) 06/07/2022   VD25OH 25.8 (L) 05/03/2021   Lab Results  Component Value Date   WBC 4.7 06/07/2022   HGB 12.1 06/07/2022   HCT 38.7 06/07/2022   MCV 88 06/07/2022   PLT 189 06/07/2022   Lab Results  Component Value Date   FERRITIN 216 (H) 06/07/2022   Attestation Statements:   Reviewed by clinician on day of visit:  allergies, medications, problem list, medical history, surgical history, family history, social history, and previous encounter notes.  Time spent on visit including pre-visit chart review and post-visit care and charting was 30 minutes.   I, Brendell Tyus, RMA, am acting as transcriptionist for Everardo Pacific, FNP.  I have reviewed the above documentation for accuracy and completeness, and I agree with the above. Everardo Pacific, FNP

## 2022-07-19 ENCOUNTER — Other Ambulatory Visit (INDEPENDENT_AMBULATORY_CARE_PROVIDER_SITE_OTHER): Payer: Self-pay | Admitting: Family Medicine

## 2022-07-19 DIAGNOSIS — F419 Anxiety disorder, unspecified: Secondary | ICD-10-CM

## 2022-08-07 ENCOUNTER — Ambulatory Visit (INDEPENDENT_AMBULATORY_CARE_PROVIDER_SITE_OTHER): Payer: Commercial Managed Care - PPO | Admitting: Family Medicine

## 2022-08-07 ENCOUNTER — Encounter (INDEPENDENT_AMBULATORY_CARE_PROVIDER_SITE_OTHER): Payer: Self-pay | Admitting: Family Medicine

## 2022-08-07 VITALS — BP 129/77 | HR 73 | Temp 98.0°F | Ht 70.0 in | Wt 327.0 lb

## 2022-08-07 DIAGNOSIS — Z6841 Body Mass Index (BMI) 40.0 and over, adult: Secondary | ICD-10-CM

## 2022-08-07 DIAGNOSIS — F419 Anxiety disorder, unspecified: Secondary | ICD-10-CM | POA: Diagnosis not present

## 2022-08-07 DIAGNOSIS — E669 Obesity, unspecified: Secondary | ICD-10-CM | POA: Diagnosis not present

## 2022-08-07 DIAGNOSIS — E559 Vitamin D deficiency, unspecified: Secondary | ICD-10-CM

## 2022-08-07 MED ORDER — VITAMIN D (ERGOCALCIFEROL) 1.25 MG (50000 UNIT) PO CAPS: 50000.0000 [IU] | ORAL_CAPSULE | ORAL | 0 refills | Status: DC

## 2022-08-13 NOTE — Progress Notes (Unsigned)
Chief Complaint:   OBESITY Julia Mccoy is here to discuss her progress with her obesity treatment plan along with follow-up of her obesity related diagnoses. Julia Mccoy is on the Category 2 Plan and states she is following her eating plan approximately 70% of the time. Julia Mccoy states she is doing 0 minutes 0 times per week.  Today's visit was #: 12 Starting weight: 340 lbs Starting date: 04/20/2021 Today's weight: 327 lbs Today's date: 08/07/2022 Total lbs lost to date: 13 Total lbs lost since last in-office visit: 0  Interim History: Julia Mccoy has been struggling with weight gain. She knows what she need to do, but cannot see to make herself go from contemplation to action.   Subjective:   1. Anxiety Julia Mccoy has not started Lexapro. She has her Buspar by her PCP recently. She is also struggling with motivation to make changes.   2. Vitamin D deficiency Julia Mccoy is on Vitamin D, and she requests a refill. No side effects were noted.   Assessment/Plan:   1. Anxiety Tatjana was referred to Dr. Dewaine Conger for evaluation.   2. Vitamin D deficiency We will refill prescription Vitamin D for 1 month. Julia Mccoy will follow-up for routine testing of Vitamin D, at least 2-3 times per year to avoid over-replacement.  - Vitamin D, Ergocalciferol, (DRISDOL) 1.25 MG (50000 UNIT) CAPS capsule; Take 1 capsule (50,000 Units total) by mouth every 7 (seven) days.  Dispense: 4 capsule; Refill: 0  3. Obesity, Current BMI 47.0 Julia Mccoy is currently in the action stage of change. As such, her goal is to continue with weight loss efforts. She has agreed to the Category 2 Plan or following a lower carbohydrate, vegetable and lean protein rich diet plan.   Behavioral modification strategies: increasing lean protein intake and increasing water intake.  Julia Mccoy has agreed to follow-up with our clinic in 3 to 4 weeks. She was informed of the importance of frequent follow-up visits to maximize her success with  intensive lifestyle modifications for her multiple health conditions.   Objective:   Blood pressure 129/77, pulse 73, temperature 98 F (36.7 C), height 5\' 10"  (1.778 m), weight (!) 327 lb (148.3 kg), SpO2 98 %. Body mass index is 46.92 kg/m.  General: Cooperative, alert, well developed, in no acute distress. HEENT: Conjunctivae and lids unremarkable. Cardiovascular: Regular rhythm.  Lungs: Normal work of breathing. Neurologic: No focal deficits.   Lab Results  Component Value Date   CREATININE 0.78 06/07/2022   BUN 13 06/07/2022   NA 137 06/07/2022   K 3.6 06/07/2022   CL 105 06/07/2022   CO2 20 06/07/2022   Lab Results  Component Value Date   ALT 14 06/07/2022   AST 14 06/07/2022   ALKPHOS 59 06/07/2022   BILITOT 0.5 06/07/2022   Lab Results  Component Value Date   HGBA1C 5.5 06/07/2022   HGBA1C 5.6 05/03/2021   Lab Results  Component Value Date   INSULIN 15.1 06/07/2022   INSULIN 22.4 05/03/2021   Lab Results  Component Value Date   TSH 2.380 06/07/2022   Lab Results  Component Value Date   CHOL 171 06/07/2022   HDL 44 06/07/2022   LDLCALC 112 (H) 06/07/2022   TRIG 80 06/07/2022   Lab Results  Component Value Date   VD25OH 24.2 (L) 06/07/2022   VD25OH 25.8 (L) 05/03/2021   Lab Results  Component Value Date   WBC 4.7 06/07/2022   HGB 12.1 06/07/2022   HCT 38.7 06/07/2022   MCV 88  06/07/2022   PLT 189 06/07/2022   Lab Results  Component Value Date   FERRITIN 216 (H) 06/07/2022   Attestation Statements:   Reviewed by clinician on day of visit: allergies, medications, problem list, medical history, surgical history, family history, social history, and previous encounter notes.   I, Trixie Dredge, am acting as transcriptionist for Dennard Nip, MD.  I have reviewed the above documentation for accuracy and completeness, and I agree with the above. -  Dennard Nip, MD

## 2022-08-17 ENCOUNTER — Encounter: Payer: Self-pay | Admitting: Neurology

## 2022-08-17 ENCOUNTER — Ambulatory Visit: Payer: Commercial Managed Care - PPO | Admitting: Neurology

## 2022-08-17 ENCOUNTER — Ambulatory Visit (INDEPENDENT_AMBULATORY_CARE_PROVIDER_SITE_OTHER): Payer: Commercial Managed Care - PPO | Admitting: Neurology

## 2022-08-17 VITALS — BP 128/79 | HR 108 | Ht 70.0 in | Wt 328.0 lb

## 2022-08-17 DIAGNOSIS — G932 Benign intracranial hypertension: Secondary | ICD-10-CM

## 2022-08-17 NOTE — Patient Instructions (Signed)
Continue with Diamox 500 mg twice daily  Can decrease it to 500 mg daily if you still experiencing paresthesia  If unable to tolerate the Diamox, we will then switch you to topiramate Continue with CPAP Continue with weight loss goal Follow-up in 6 months or sooner if worse

## 2022-08-17 NOTE — Progress Notes (Signed)
[facility]  PATIENT: Julia Mccoy DOB: 1976-07-15  REFERRING CLINICIAN: Loyola Mast, PA-C HISTORY FROM: Patient  REASON FOR VISIT: Headaches    HISTORICAL  CHIEF COMPLAINT:  Chief Complaint  Patient presents with   Follow-up    Rm 12, alone  States she is stable, no new concerns    INTERVAL HISTORY 08/17/2022:  Patient presents today for follow-up, last visit was in May, at that time we discussed compliance with medication and CPAP and also goal of weight loss.  She reported that she is still not adherent to the Diamox because she does have paresthesia in the bilateral hands.  She does use her CPAP consistently but reports some difficulty with weight loss.  In terms of the headaches she reports the headaches are stable, she knows when she is consistent with the Diamox the headaches will stay at [place].  No additional concerns.   INTERVAL HISTORY 02/14/2022:  Patient presents today for follow-up, last visit was in November, at that time plan was to start Diamox 500 mg twice daily.  Patient has been taking the medication inconsistently.  Once taking the medication her headaches will resolve, she will stop the medication and developed headaches later, then restart the medication and after 5 to 7-days headache will resolve.  She did report following with an eye doctor and was told that everything was normal there was no evidence of pressure behind her eyes.  She does also report using her CPAP machine inconsistently.  She is working towards losing weight but she has been challenging for her.     HISTORY OF PRESENT ILLNESS:  This is a 46 year old woman with past medical history of hypertension, hypothyroidism, sleep apnea on CPAP< anxiety and obesity who is presenting for headache.  Patient stated headache started last year on April, she has pressure type headache on the left side of her forehead, sometimes behind eyes, and sometimes on the right side.  She presented  to the ED in April 2021, had a CTA head and neck which was normal and her CT head showed evidence of empty sella.  She was not started on any medication but was told to follow-up with her primary care doctor.  Patient followed-up with her primary care, was started on Effexor but did not take the medication due to concern of side effect.  She still complains of occasional headache lasting 30 minutes to 1 hour, she takes acetaminophen which seemed to resolve the headache.  Last episode was 2 weeks ago.  There are no associated nausea, no vomiting, no migrainous features.  She had a family history of migraine with her mother.     Headache History and Characteristics: Onset: April 2021, went to the ED  Location: All over  Quality: Pressure, aching   Intensity:7 /10.  Duration: last for about 1 hour Migrainous Features: No Photophobia, no phonophobia, no nausea/vomiting.  Aura: No  History of brain injury or tumor: No  Family history: Mother with migraines  Motion sickness: no Cardiac history: no  OTC: tylenol Sleep: Sleep is pretty good, will get about 6 hrs, has CPAP Mood/ Stress: ok  Prior prophylaxis: Propranolol: No  Verapamil:No TCA: No Topamax: No Depakote: No Effexor: No Cymbalta: No Neurontin:No  Prior abortives: Triptan: No Anti-emetic: No Steroids: No Ergotamine suppository: No  OTHER MEDICAL CONDITIONS: HTN, Hypothyroidism, Sleep apnea, Anxiety    REVIEW OF SYSTEMS: Full 14 system review of systems performed and negative with exception of: as noted in the HPI  ALLERGIES: No Known Allergies  HOME MEDICATIONS: Outpatient Medications Prior to Visit  Medication Sig Dispense Refill   acetaZOLAMIDE ER (DIAMOX) 500 MG capsule Take 1 capsule (500 mg total) by mouth 2 (two) times daily. 180 capsule 1   amLODipine (NORVASC) 10 MG tablet Take 10 mg by mouth daily.     busPIRone (BUSPAR) 30 MG tablet Take 30 mg by mouth 2 (two) times daily.     levothyroxine (SYNTHROID,  LEVOTHROID) 88 MCG tablet Take 88 mcg by mouth daily.     losartan-hydrochlorothiazide (HYZAAR) 50-12.5 MG per tablet Take 1 tablet by mouth daily.     senna-docusate (SENOKOT-S) 8.6-50 MG tablet Take 1 tablet by mouth daily. 30 tablet 0   Vitamin D, Ergocalciferol, (DRISDOL) 1.25 MG (50000 UNIT) CAPS capsule Take 1 capsule (50,000 Units total) by mouth every 7 (seven) days. 4 capsule 0   No facility-administered medications prior to visit.    PAST MEDICAL HISTORY: Past Medical History:  Diagnosis Date   Anxiety    Back pain    Chest pain    Constipation    Depression    Edema of both lower extremities    GERD (gastroesophageal reflux disease)    Hypertension    Hypothyroidism    Joint pain    Obesity    Palpitations    Pre-diabetes    Sleep apnea     PAST SURGICAL HISTORY: Past Surgical History:  Procedure Laterality Date   KELOID EXCISION      FAMILY HISTORY: Family History  Problem Relation Age of Onset   Hypertension Mother    Hyperlipidemia Mother    Depression Mother    Hypertension Father    Hyperlipidemia Father    Diabetes Father    Cancer Father    Heart attack Cousin     SOCIAL HISTORY: Social History   Socioeconomic History   Marital status: Single    Spouse name: Not on file   Number of children: Not on file   Years of education: Not on file   Highest education level: Not on file  Occupational History   Occupation: Therapist Mental Health  Tobacco Use   Smoking status: Never   Smokeless tobacco: Never  Vaping Use   Vaping Use: Never used  Substance and Sexual Activity   Alcohol use: No   Drug use: No   Sexual activity: Not on file  Other Topics Concern   Not on file  Social History Narrative   Not on file   Social Determinants of Health   Financial Resource Strain: Not on file  Food Insecurity: Not on file  Transportation Needs: Not on file  Physical Activity: Not on file  Stress: Not on file  Social Connections: Not on file   Intimate Partner Violence: Not on file    PHYSICAL EXAM  GENERAL EXAM/CONSTITUTIONAL: Vitals:  Vitals:   08/17/22 0959  BP: 128/79  Pulse: (!) 108  Weight: (!) 328 lb (148.8 kg)  Height: 5\' 10"  (1.778 m)     Body mass index is 47.06 kg/m. Wt Readings from Last 3 Encounters:  08/17/22 (!) 328 lb (148.8 kg)  08/07/22 (!) 327 lb (148.3 kg)  07/17/22 (!) 325 lb (147.4 kg)   Patient is in no distress; well developed, nourished and groomed; neck is supple  EYES: Pupils round and reactive to light, Visual fields full to confrontation, Extraocular movements intacts,   MUSCULOSKELETAL: Gait, strength, tone, movements noted in Neurologic exam below  NEUROLOGIC: MENTAL STATUS:  No data to display         awake, alert, oriented to person, place and time recent and remote memory intact normal attention and concentration language fluent, comprehension intact, naming intact fund of knowledge appropriate  CRANIAL NERVE:  2nd, 3rd, 4th, 6th - pupils equal and reactive to light, visual fields full to confrontation, extraocular muscles intact, no nystagmus 5th - facial sensation symmetric 7th - facial strength symmetric 8th - hearing intact 9th - palate elevates symmetrically, uvula midline 11th - shoulder shrug symmetric 12th - tongue protrusion midline  MOTOR:  normal bulk and tone, full strength in the BUE, BLE  SENSORY:  normal and symmetric to light touch, pinprick, temperature, vibration  COORDINATION:  finger-nose-finger, fine finger movements normal  REFLEXES:  deep tendon reflexes present and symmetric  GAIT/STATION:  normal    DIAGNOSTIC DATA (LABS, IMAGING, TESTING) - I reviewed patient records, labs, notes, testing and imaging myself where available.  Lab Results  Component Value Date   WBC 4.7 06/07/2022   HGB 12.1 06/07/2022   HCT 38.7 06/07/2022   MCV 88 06/07/2022   PLT 189 06/07/2022      Component Value Date/Time   NA 137  06/07/2022 1131   K 3.6 06/07/2022 1131   CL 105 06/07/2022 1131   CO2 20 06/07/2022 1131   GLUCOSE 101 (H) 06/07/2022 1131   GLUCOSE 88 11/21/2021 2010   BUN 13 06/07/2022 1131   CREATININE 0.78 06/07/2022 1131   CALCIUM 9.3 06/07/2022 1131   PROT 7.6 06/07/2022 1131   ALBUMIN 4.3 06/07/2022 1131   AST 14 06/07/2022 1131   ALT 14 06/07/2022 1131   ALKPHOS 59 06/07/2022 1131   BILITOT 0.5 06/07/2022 1131   GFRNONAA >60 11/21/2021 2010   GFRAA >60 02/03/2020 1853   Lab Results  Component Value Date   CHOL 171 06/07/2022   HDL 44 06/07/2022   LDLCALC 112 (H) 06/07/2022   TRIG 80 06/07/2022   Lab Results  Component Value Date   HGBA1C 5.5 06/07/2022   Lab Results  Component Value Date   VITAMINB12 514 06/07/2022   Lab Results  Component Value Date   TSH 2.380 06/07/2022    CT HEAD IMPRESSION: 1. No acute intracranial abnormality. 2. Empty sella. While this finding is often incidental in nature and of no clinical significance, this can also be seen in the setting of idiopathic intracranial hypertension. 3. Otherwise unremarkable head CT.    CTA HEAD AND NECK IMPRESSION: Normal CTA of the head and neck. No large vessel occlusion, hemodynamically significant stenosis, or other acute vascular abnormality. No aneurysm.   ASSESSMENT AND PLAN  46 y.o. year old female with past medical history of hypertension, anxiety, sleep apnea on CPAP, obesity who is presenting for follow up for her idiopathic intracranial hypertension.   She still inconsistent with the Diamox but reports when she takes it the headaches will improve.  Again advised her to continue with Diamox, can decrease it to 500 mg daily to reduce the paresthesia.  I have told her that if she is unable to tolerate the Diamox we can switch her to topiramate, that will also help her in her weight loss goal.  Continue using your CPAP, continue to follow-up with PCP and return in 6 months or sooner if worse.     1.  IIH (idiopathic intracranial hypertension)      Patient Instructions  Continue with Diamox 500 mg twice daily  Can decrease it to 500 mg daily  if you still experiencing paresthesia  If unable to tolerate the Diamox, we will then switch you to topiramate Continue with CPAP Continue with weight loss goal Follow-up in 6 months or sooner if worse   No orders of the defined types were placed in this encounter.    No orders of the defined types were placed in this encounter.    Return in about 6 months (around 02/15/2023).   Windell Norfolk, MD 08/17/2022, 10:35 AM  [facility] [address], [address] [place], [place] [address] [phone]

## 2022-08-28 ENCOUNTER — Ambulatory Visit: Payer: Commercial Managed Care - PPO | Admitting: Nurse Practitioner

## 2022-09-11 ENCOUNTER — Encounter: Payer: Self-pay | Admitting: Nurse Practitioner

## 2022-09-11 ENCOUNTER — Ambulatory Visit (INDEPENDENT_AMBULATORY_CARE_PROVIDER_SITE_OTHER): Payer: Commercial Managed Care - PPO | Admitting: Nurse Practitioner

## 2022-09-11 VITALS — BP 125/70 | HR 63 | Temp 98.5°F | Ht 70.0 in | Wt 325.0 lb

## 2022-09-11 DIAGNOSIS — F419 Anxiety disorder, unspecified: Secondary | ICD-10-CM

## 2022-09-11 DIAGNOSIS — E669 Obesity, unspecified: Secondary | ICD-10-CM | POA: Diagnosis not present

## 2022-09-11 DIAGNOSIS — E559 Vitamin D deficiency, unspecified: Secondary | ICD-10-CM | POA: Diagnosis not present

## 2022-09-11 DIAGNOSIS — Z6841 Body Mass Index (BMI) 40.0 and over, adult: Secondary | ICD-10-CM

## 2022-09-11 MED ORDER — VITAMIN D (ERGOCALCIFEROL) 1.25 MG (50000 UNIT) PO CAPS: 50000.0000 [IU] | ORAL_CAPSULE | ORAL | 0 refills | Status: DC

## 2022-09-19 NOTE — Progress Notes (Unsigned)
Chief Complaint:   OBESITY Julia Mccoy is here to discuss her progress with her obesity treatment plan along with follow-up of her obesity related diagnoses. Julia Mccoy is on keeping a food journal and adhering to recommended goals of 1200-1300 calories and 75 grams protein and states she is following her eating plan approximately 50% of the time. Julia Mccoy states she is walking 20-30 minutes 2 times per week.  Today's visit was #: 13 Starting weight: 340 lbs Starting date: 04/20/2021 Today's weight: 325 lbs Today's date: 09/11/2022 Total lbs lost to date: 15 lbs Total lbs lost since last in-office visit: 2  Interim History: Julia Mccoy was seen last on 08/07/22. Since last visit has been following [facility]/[place]. Not skipping meals. Sometimes meet protein goals. Does well with breakfast. Struggles with lunch and dinner. Eats out more during lunch. Feels this is a hard time of the year to work on weight loss.  Subjective:   1. Vitamin D deficiency Julia Mccoy is currently taking prescription Vit D 50,000 IU once a week. Denies side effects.  Denies nausea, vomiting or muscle weakness.   2. Anxiety Still has not started Lexapro. Struggles with motivation. Some stress eating.  Assessment/Plan:   1. Vitamin D deficiency We will refill Vit D 50,000 IU once a week for 1 month with 0 refills.  Side effects discussed.   Low Vitamin D level contributes to fatigue and are associated with obesity, breast, and colon cancer. She agrees to continue to take prescription Vitamin D @50 ,000 IU every week and will follow-up for routine testing of Vitamin D, at least 2-3 times per year to avoid over-replacement.   -Refill Vitamin D, Ergocalciferol, (DRISDOL) 1.25 MG (50000 UNIT) CAPS capsule; Take 1 capsule (50,000 Units total) by mouth every 7 (seven) days.  Dispense: 4 capsule; Refill: 0  2. Anxiety Recommended starting Lexapro and seeing Dr. .  3. Obesity, Current BMI 46.7 Julia Mccoy is currently in the action  stage of change. As such, her goal is to continue with weight loss efforts. She has agreed to keeping a food journal and adhering to recommended goals of 1200-1300 calories and 75 grams protein.   Exercise goals: All adults should avoid inactivity. Some physical activity is better than none, and adults who participate in any amount of physical activity gain some health benefits.  Behavioral modification strategies: increasing lean protein intake, meal planning and cooking strategies, and holiday eating strategies .  Julia Mccoy has agreed to follow-up with our clinic in 3 weeks. She was informed of the importance of frequent follow-up visits to maximize her success with intensive lifestyle modifications for her multiple health conditions.   Objective:   Blood pressure 125/70, pulse 63, temperature 98.5 F (36.9 C), height 5\' 10"  (1.778 m), weight (!) 325 lb (147.4 kg), SpO2 95 %. Body mass index is 46.63 kg/m.  General: Cooperative, alert, well developed, in no acute distress. HEENT: Conjunctivae and lids unremarkable. Cardiovascular: Regular rhythm.  Lungs: Normal work of breathing. Neurologic: No focal deficits.   Lab Results  Component Value Date   CREATININE 0.78 06/07/2022   BUN 13 06/07/2022   NA 137 06/07/2022   K 3.6 06/07/2022   CL 105 06/07/2022   CO2 20 06/07/2022   Lab Results  Component Value Date   ALT 14 06/07/2022   AST 14 06/07/2022   ALKPHOS 59 06/07/2022   BILITOT 0.5 06/07/2022   Lab Results  Component Value Date   HGBA1C 5.5 06/07/2022   HGBA1C 5.6 05/03/2021   Lab  Results  Component Value Date   INSULIN 15.1 06/07/2022   INSULIN 22.4 05/03/2021   Lab Results  Component Value Date   TSH 2.380 06/07/2022   Lab Results  Component Value Date   CHOL 171 06/07/2022   HDL 44 06/07/2022   LDLCALC 112 (H) 06/07/2022   TRIG 80 06/07/2022   Lab Results  Component Value Date   VD25OH 24.2 (L) 06/07/2022   VD25OH 25.8 (L) 05/03/2021   Lab Results   Component Value Date   WBC 4.7 06/07/2022   HGB 12.1 06/07/2022   HCT 38.7 06/07/2022   MCV 88 06/07/2022   PLT 189 06/07/2022   Lab Results  Component Value Date   FERRITIN 216 (H) 06/07/2022   Attestation Statements:   Reviewed by clinician on day of visit: allergies, medications, problem list, medical history, surgical history, family history, social history, and previous encounter notes.  I, Brendell Tyus, RMA, am acting as transcriptionist for Irene Limbo, FNP.  I have reviewed the above documentation for accuracy and completeness, and I agree with the above. Irene Limbo, FNP

## 2022-10-03 ENCOUNTER — Encounter: Payer: Self-pay | Admitting: Nurse Practitioner

## 2022-10-03 ENCOUNTER — Ambulatory Visit (INDEPENDENT_AMBULATORY_CARE_PROVIDER_SITE_OTHER): Payer: Commercial Managed Care - PPO | Admitting: Nurse Practitioner

## 2022-10-03 VITALS — BP 132/83 | HR 66 | Temp 97.6°F | Ht 70.0 in | Wt 326.0 lb

## 2022-10-03 DIAGNOSIS — Z6841 Body Mass Index (BMI) 40.0 and over, adult: Secondary | ICD-10-CM

## 2022-10-03 DIAGNOSIS — E669 Obesity, unspecified: Secondary | ICD-10-CM | POA: Diagnosis not present

## 2022-10-03 DIAGNOSIS — E559 Vitamin D deficiency, unspecified: Secondary | ICD-10-CM

## 2022-10-03 MED ORDER — VITAMIN D (ERGOCALCIFEROL) 1.25 MG (50000 UNIT) PO CAPS: 50000.0000 [IU] | ORAL_CAPSULE | ORAL | 0 refills | Status: DC
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
## 2022-10-24 NOTE — Progress Notes (Signed)
Chief Complaint:   OBESITY Julia Mccoy is here to discuss her progress with her obesity treatment plan along with follow-up of her obesity related diagnoses. Julia Mccoy is on keeping a food journal and adhering to recommended goals of 1200-1300 calories and 75 grams of protein and states she is following her eating plan approximately 70% of the time. Julia Mccoy states she is walking 15-20 minutes 2 times per week.  Today's visit was #: 14 Starting weight: 340 lbs Starting date: 04/20/2021 Today's weight: 326 Today's date: 10/03/2022 Total lbs lost to date: 14 lbs Total lbs lost since last in-office visit: 0  Interim History: Julia Mccoy is struggling with eating out for breakfast.  Feels she is doing better at lunch.  She is not drinking enough water.  Subjective:   1. Vitamin D deficiency Julia Mccoy is currently taking prescription Vit D 50,000 IU once a week. Denies any nausea, vomiting or muscle weakness.  Assessment/Plan:   1. Vitamin D deficiency We will refill Vit D 50K IU once a week for 1 month with 0 refills. Side effects discussed.   Low Vitamin D level contributes to fatigue and are associated with obesity, breast, and colon cancer. She agrees to continue to take prescription Vitamin D @50 ,000 IU every week and will follow-up for routine testing of Vitamin D, at least 2-3 times per year to avoid over-replacement.   -Refill Vitamin D, Ergocalciferol, (DRISDOL) 1.25 MG (50000 UNIT) CAPS capsule; Take 1 capsule (50,000 Units total) by mouth every 7 (seven) days.  Dispense: 4 capsule; Refill: 0  2. Obesity, Current BMI 46.9 Julia Mccoy is currently in the action stage of change. As such, her goal is to continue with weight loss efforts. She has agreed to the Category 2 Plan.   Patient encouraged to track closely. Will obtain IC at next visit.  Exercise goals: All adults should avoid inactivity. Some physical activity is better than none, and adults who participate in any amount of physical  activity gain some health benefits.  Behavioral modification strategies: increasing lean protein intake, increasing water intake, holiday eating strategies , and keeping a strict food journal.  Julia Mccoy has agreed to follow-up with our clinic in 3 weeks. She was informed of the importance of frequent follow-up visits to maximize her success with intensive lifestyle modifications for her multiple health conditions.   Objective:   Blood pressure 132/83, pulse 66, temperature 97.6 F (36.4 C), temperature source Oral, height 5\' 10"  (1.778 m), weight (!) 326 lb (147.9 kg), SpO2 100 %. Body mass index is 46.78 kg/m.  General: Cooperative, alert, well developed, in no acute distress. HEENT: Conjunctivae and lids unremarkable. Cardiovascular: Regular rhythm.  Lungs: Normal work of breathing. Neurologic: No focal deficits.   Lab Results  Component Value Date   CREATININE 0.78 06/07/2022   BUN 13 06/07/2022   NA 137 06/07/2022   K 3.6 06/07/2022   CL 105 06/07/2022   CO2 20 06/07/2022   Lab Results  Component Value Date   ALT 14 06/07/2022   AST 14 06/07/2022   ALKPHOS 59 06/07/2022   BILITOT 0.5 06/07/2022   Lab Results  Component Value Date   HGBA1C 5.5 06/07/2022   HGBA1C 5.6 05/03/2021   Lab Results  Component Value Date   INSULIN 15.1 06/07/2022   INSULIN 22.4 05/03/2021   Lab Results  Component Value Date   TSH 2.380 06/07/2022   Lab Results  Component Value Date   CHOL 171 06/07/2022   HDL 44 06/07/2022  LDLCALC 112 (H) 06/07/2022   TRIG 80 06/07/2022   Lab Results  Component Value Date   VD25OH 24.2 (L) 06/07/2022   VD25OH 25.8 (L) 05/03/2021   Lab Results  Component Value Date   WBC 4.7 06/07/2022   HGB 12.1 06/07/2022   HCT 38.7 06/07/2022   MCV 88 06/07/2022   PLT 189 06/07/2022   Lab Results  Component Value Date   FERRITIN 216 (H) 06/07/2022   Attestation Statements:   Reviewed by clinician on day of visit: allergies, medications, problem  list, medical history, surgical history, family history, social history, and previous encounter notes.  I, Brendell Tyus, RMA, am acting as transcriptionist for Everardo Pacific, FNP.  I have reviewed the above documentation for accuracy and completeness, and I agree with the above. Everardo Pacific, FNP

## 2022-10-30 ENCOUNTER — Encounter: Payer: Self-pay | Admitting: Nurse Practitioner

## 2022-10-30 ENCOUNTER — Ambulatory Visit (INDEPENDENT_AMBULATORY_CARE_PROVIDER_SITE_OTHER): Payer: Commercial Managed Care - PPO | Admitting: Nurse Practitioner

## 2022-10-30 VITALS — BP 123/75 | HR 61 | Temp 98.2°F | Ht 70.0 in | Wt 325.0 lb

## 2022-10-30 DIAGNOSIS — Z6841 Body Mass Index (BMI) 40.0 and over, adult: Secondary | ICD-10-CM | POA: Diagnosis not present

## 2022-10-30 DIAGNOSIS — E559 Vitamin D deficiency, unspecified: Secondary | ICD-10-CM

## 2022-10-30 DIAGNOSIS — E669 Obesity, unspecified: Secondary | ICD-10-CM

## 2022-10-30 DIAGNOSIS — F419 Anxiety disorder, unspecified: Secondary | ICD-10-CM

## 2022-10-30 MED ORDER — VITAMIN D (ERGOCALCIFEROL) 1.25 MG (50000 UNIT) PO CAPS: 50000.0000 [IU] | ORAL_CAPSULE | ORAL | 0 refills | Status: DC

## 2022-11-02 ENCOUNTER — Other Ambulatory Visit: Payer: Self-pay | Admitting: Neurology

## 2022-11-07 NOTE — Progress Notes (Signed)
Chief Complaint:   OBESITY Julia Mccoy is here to discuss her progress with her obesity treatment plan along with follow-up of her obesity related diagnoses. Julia Mccoy is on the Category 2 Plan and states she is following her eating plan approximately 50% of the time. Julia Mccoy states she is exercising 0 minutes 0 times per week.  Today's visit was #: 15 Starting weight: 340 lbs Starting date: 04/20/2021 Today's weight: 325 lbs Today's date: 10/30/2022 Total lbs lost to date: 15 lbs Total lbs lost since last in-office visit: 1  Interim History: At her last visit, we discussed her tracking calories/macros and coming in for IC.  She is not tracking and did not come in for IC.  She is not skipping meals.  Eating protein with each meal.  She stopped eating out for breakfast.  Working on increasing water intake.  Subjective:   1. Vitamin D deficiency Julia Mccoy is currently taking prescription Vit D 50,000 IU once a week.  Denies any nausea, vomiting or muscle weakness.  2. Anxiety Julia Mccoy is taking Buspar 30 mg, 1/2 tablet once daily.  If she takes 30 mg, sturggles with fatigue,  She never started Lexapro but plans to start.  Assessment/Plan:   1. Vitamin D deficiency We will refill Vit D 50K IU once a week for 1 month with 0 refills.  Side effects discussed.   -Refill Vitamin D, Ergocalciferol, (DRISDOL) 1.25 MG (50000 UNIT) CAPS capsule; Take 1 capsule (50,000 Units total) by mouth every 7 (seven) days.  Dispense: 4 capsule; Refill: 0  2. Anxiety Start Lexapro 10 mg.  Side effects discussed.   3. Obesity, Current BMI 46.7 Julia Mccoy is currently in the action stage of change. As such, her goal is to continue with weight loss efforts. She has agreed to following a lower carbohydrate, vegetable and lean protein rich diet plan.   Exercise goals: All adults should avoid inactivity. Some physical activity is better than none, and adults who participate in any amount of physical activity gain  some health benefits.  Behavioral modification strategies: increasing lean protein intake, increasing vegetables, and increasing water intake.  Julia Mccoy has agreed to follow-up with our clinic in 3 weeks. She was informed of the importance of frequent follow-up visits to maximize her success with intensive lifestyle modifications for her multiple health conditions.   Objective:   Blood pressure 123/75, pulse 61, temperature 98.2 F (36.8 C), height 5\' 10"  (1.778 m), weight (!) 325 lb (147.4 kg), SpO2 100 %. Body mass index is 46.63 kg/m.  General: Cooperative, alert, well developed, in no acute distress. HEENT: Conjunctivae and lids unremarkable. Cardiovascular: Regular rhythm.  Lungs: Normal work of breathing. Neurologic: No focal deficits.   Lab Results  Component Value Date   CREATININE 0.78 06/07/2022   BUN 13 06/07/2022   NA 137 06/07/2022   K 3.6 06/07/2022   CL 105 06/07/2022   CO2 20 06/07/2022   Lab Results  Component Value Date   ALT 14 06/07/2022   AST 14 06/07/2022   ALKPHOS 59 06/07/2022   BILITOT 0.5 06/07/2022   Lab Results  Component Value Date   HGBA1C 5.5 06/07/2022   HGBA1C 5.6 05/03/2021   Lab Results  Component Value Date   INSULIN 15.1 06/07/2022   INSULIN 22.4 05/03/2021   Lab Results  Component Value Date   TSH 2.380 06/07/2022   Lab Results  Component Value Date   CHOL 171 06/07/2022   HDL 44 06/07/2022   LDLCALC 112 (H)  06/07/2022   TRIG 80 06/07/2022   Lab Results  Component Value Date   VD25OH 24.2 (L) 06/07/2022   VD25OH 25.8 (L) 05/03/2021   Lab Results  Component Value Date   WBC 4.7 06/07/2022   HGB 12.1 06/07/2022   HCT 38.7 06/07/2022   MCV 88 06/07/2022   PLT 189 06/07/2022   Lab Results  Component Value Date   FERRITIN 216 (H) 06/07/2022   Attestation Statements:   Reviewed by clinician on day of visit: allergies, medications, problem list, medical history, surgical history, family history, social history,  and previous encounter notes.  I, Brendell Tyus, RMA, am acting as transcriptionist for Everardo Pacific, FNP.  I have reviewed the above documentation for accuracy and completeness, and I agree with the above. Everardo Pacific, FNP

## 2022-11-20 ENCOUNTER — Ambulatory Visit (INDEPENDENT_AMBULATORY_CARE_PROVIDER_SITE_OTHER): Payer: Commercial Managed Care - PPO | Admitting: Nurse Practitioner

## 2022-11-20 ENCOUNTER — Encounter: Payer: Self-pay | Admitting: Nurse Practitioner

## 2022-11-20 VITALS — BP 124/71 | HR 75 | Temp 98.3°F | Ht 70.0 in | Wt 329.0 lb

## 2022-11-20 DIAGNOSIS — F419 Anxiety disorder, unspecified: Secondary | ICD-10-CM

## 2022-11-20 DIAGNOSIS — M25562 Pain in left knee: Secondary | ICD-10-CM

## 2022-11-20 DIAGNOSIS — Z6841 Body Mass Index (BMI) 40.0 and over, adult: Secondary | ICD-10-CM | POA: Diagnosis not present

## 2022-11-20 NOTE — Progress Notes (Signed)
Office: [phone]  /  Fax: [phone]  WEIGHT SUMMARY AND BIOMETRICS  Medical Weight Loss Height: 5\' 10"  (1.778 m) Temp: 98.3 F (36.8 C) Pulse Rate: 75 BP: 124/71 SpO2: 100 % Today's Visit # 16 Weight at Last Visit: 325lb Weight Lost Since Last Visit: up 4 lbs  Body Fat %: 49.1 % Fat Mass (lbs): 161.6 lbs Muscle Mass (lbs): 159 lbs Total Body Water (lbs): 107.6 lbs Visceral Fat Rating : 17  HPI  Chief Complaint: OBESITY  Julia Mccoy is here to discuss her progress with her obesity treatment plan. She is on the following a lower carbohydrate, vegetable and lean protein rich diet plan and states she is following her eating plan approximately 40 % of the time. She states she is exercising 20 minutes 1-2 times per week.   Interval History:  Since last office visit she has gained 4 lbs.  She has gotten off track since her last visit. She has been trying to drink more water and "work out"drinks. Struggling with polyphagia and cravings.  Notes some stress and stress eating.     Pharmacotherapy: Not currently on weight loss medications.   Past medications:  She has tried Topamax in the past for cravings.  Doesn't remember why she stopped taking it.    PHYSICAL EXAM:  Blood pressure 124/71, pulse 75, temperature 98.3 F (36.8 C), height 5\' 10"  (1.778 m), weight (!) 329 lb (149.2 kg), last menstrual period 11/01/2022, SpO2 100 %. Body mass index is 47.21 kg/m.  General: She is overweight, cooperative, alert, well developed, and in no acute distress. PSYCH: Has normal mood, affect and thought process.   Extremities: No edema.  Neurologic: No gross sensory or motor deficits. No tremors or fasciculations noted.    DIAGNOSTIC DATA REVIEWED:  BMET    Component Value Date/Time   NA 137 06/07/2022 1131   K 3.6 06/07/2022 1131   CL 105 06/07/2022 1131   CO2 20 06/07/2022 1131   GLUCOSE 101 (H) 06/07/2022 1131   GLUCOSE 88 11/21/2021 2010   BUN 13 06/07/2022 1131    CREATININE 0.78 06/07/2022 1131   CALCIUM 9.3 06/07/2022 1131   GFRNONAA >60 11/21/2021 2010   GFRAA >60 02/03/2020 1853   Lab Results  Component Value Date   HGBA1C 5.5 06/07/2022   HGBA1C 5.6 05/03/2021   Lab Results  Component Value Date   INSULIN 15.1 06/07/2022   INSULIN 22.4 05/03/2021   Lab Results  Component Value Date   TSH 2.380 06/07/2022   CBC    Component Value Date/Time   WBC 4.7 06/07/2022 1131   WBC 4.9 11/21/2021 2010   RBC 4.39 06/07/2022 1131   RBC 4.50 11/21/2021 2010   HGB 12.1 06/07/2022 1131   HCT 38.7 06/07/2022 1131   PLT 189 06/07/2022 1131   MCV 88 06/07/2022 1131   MCH 27.6 06/07/2022 1131   MCH 27.8 11/21/2021 2010   MCHC 31.3 (L) 06/07/2022 1131   MCHC 32.1 11/21/2021 2010   RDW 12.7 06/07/2022 1131   Iron Studies    Component Value Date/Time   FERRITIN 216 (H) 06/07/2022 1131   Lipid Panel     Component Value Date/Time   CHOL 171 06/07/2022 1131   TRIG 80 06/07/2022 1131   HDL 44 06/07/2022 1131   LDLCALC 112 (H) 06/07/2022 1131   Hepatic Function Panel     Component Value Date/Time   PROT 7.6 06/07/2022 1131   ALBUMIN 4.3 06/07/2022 1131   AST 14 06/07/2022 1131  ALT 14 06/07/2022 1131   ALKPHOS 59 06/07/2022 1131   BILITOT 0.5 06/07/2022 1131      Component Value Date/Time   TSH 2.380 06/07/2022 1131   Nutritional Lab Results  Component Value Date   VD25OH 24.2 (L) 06/07/2022   VD25OH 25.8 (L) 05/03/2021     ASSESSMENT AND PLAN  TREATMENT PLAN FOR OBESITY:  Recommended Dietary Goals  Julia Mccoy is currently in the action stage of change. As such, her goal is to continue weight management plan. She has agreed to keeping a food journal and adhering to recommended goals of 1200-1300 calories and 75+ protein.  Behavioral Intervention  We discussed the following Behavioral Modification Strategies today: increasing lean protein intake, decreasing simple carbohydrates , avoid skipping meals, and increase water  intake.  Additional resources provided today: NA  Recommended Physical Activity Goals  Julia Mccoy has been advised to work up to 150 minutes of moderate intensity aerobic activity a week and strengthening exercises 2-3 times per week for cardiovascular health, weight loss maintenance and preservation of muscle mass.   She has agreed to implement exercise 3 days per week starting at 10 minutes and build as tolerated.     Pharmacotherapy We discussed various medication options to help Julia Mccoy with her weight loss efforts and we both agreed to obtain labs and IC at next visit.  Consider GLP-1 based upon insurance coverage.   ASSOCIATED CONDITIONS ADDRESSED TODAY  Anxiety Patient hasn't started Lexapro yet.  Not sure if she is going to start taking it.  She is struggling with stress and some stress eating.    Acute pain of left knee [employer] at work on her left knee Jan 19th.  Notes pain and swelling.  Used ice without relief. Pain is getting worse. We contacted her PCP.  They will call her to schedule an appointment.       Morbid obesity ([place])  BMI 45.0-49.9, adult ([place])  Will obtain labs and IC at next visit.  Needs to come in fasting for labs and IC.  Patient verbalizes understanding.    Return in about 6 weeks (around 01/01/2023).Marland Kitchen She was informed of the importance of frequent follow up visits to maximize her success with intensive lifestyle modifications for her multiple health conditions.   ATTESTASTION STATEMENTS:  Reviewed by clinician on day of visit: allergies, medications, problem list, medical history, surgical history, family history, social history, and previous encounter notes.   Time spent on visit including pre-visit chart review and post-visit care and charting was 30 minutes.    Ailene Rud. Robert Sperl FNP-C

## 2022-11-20 NOTE — Patient Instructions (Addendum)

## 2023-01-01 ENCOUNTER — Ambulatory Visit: Payer: Commercial Managed Care - PPO | Admitting: Nurse Practitioner

## 2023-01-15 ENCOUNTER — Ambulatory Visit (INDEPENDENT_AMBULATORY_CARE_PROVIDER_SITE_OTHER): Payer: Commercial Managed Care - PPO | Admitting: Nurse Practitioner

## 2023-01-15 ENCOUNTER — Encounter: Payer: Self-pay | Admitting: Nurse Practitioner

## 2023-01-15 VITALS — BP 128/80 | HR 66 | Temp 98.4°F | Ht 70.0 in | Wt 329.0 lb

## 2023-01-15 DIAGNOSIS — E559 Vitamin D deficiency, unspecified: Secondary | ICD-10-CM

## 2023-01-15 DIAGNOSIS — R7303 Prediabetes: Secondary | ICD-10-CM

## 2023-01-15 DIAGNOSIS — R5383 Other fatigue: Secondary | ICD-10-CM

## 2023-01-15 DIAGNOSIS — G4733 Obstructive sleep apnea (adult) (pediatric): Secondary | ICD-10-CM

## 2023-01-15 DIAGNOSIS — R7989 Other specified abnormal findings of blood chemistry: Secondary | ICD-10-CM | POA: Diagnosis not present

## 2023-01-15 DIAGNOSIS — Z6841 Body Mass Index (BMI) 40.0 and over, adult: Secondary | ICD-10-CM

## 2023-01-15 DIAGNOSIS — E038 Other specified hypothyroidism: Secondary | ICD-10-CM

## 2023-01-15 MED ORDER — VITAMIN D (ERGOCALCIFEROL) 1.25 MG (50000 UNIT) PO CAPS: 50000.0000 [IU] | ORAL_CAPSULE | ORAL | 0 refills | Status: DC

## 2023-01-15 NOTE — Progress Notes (Signed)
Office: [phone]  /  Fax: [phone]  WEIGHT SUMMARY AND BIOMETRICS  Weight Lost Since Last Visit: 0  Weight Gained Since Last Visit: 0   Vitals Temp: 98.4 F (36.9 C) BP: 128/80 Pulse Rate: 66 SpO2: 96 %   Anthropometric Measurements Height: 5\' 10"  (1.778 m) Weight: (!) 329 lb (149.2 kg) BMI (Calculated): 47.21 Weight at Last Visit: 329lb Weight Lost Since Last Visit: 0 Weight Gained Since Last Visit: 0 Starting Weight: 340lb Total Weight Loss (lbs): 11 lb (4.99 kg)   Body Composition  Body Fat %: 51.4 % Fat Mass (lbs): 169.2 lbs Muscle Mass (lbs): 152 lbs Total Body Water (lbs): 107.4 lbs Visceral Fat Rating : 17   Other Clinical Data Fasting: yes Labs: yes Today's Visit #: 17 Starting Date: 04/20/21     HPI  Chief Complaint: OBESITY  Julia Mccoy is here to discuss her progress with her obesity treatment plan. She is on the the Category 2 Plan and states she is following her eating plan approximately 25 % of the time. She states she is exercising 0 minutes 0 days per week.   Interval History:  Since last office visit she has maintained her weight.  She has gotten off track since her last visit.  She has been drinking more sodas.   She reports that her energy levels are low and she is fatigued.     Pharmacotherapy for weight loss: She is not currently taking medications  for medical weight loss.   Previous pharmacotherapy for medical weight loss:  She has tried Topamax in the past for cravings. Doesn't remember why she stopped taking it.   Bariatric surgery:  Patient never had bariatric surgery.    Vit D deficiency  She is taking Vit D 50,000 IU weekly (stopped taking a few ago).  Denies side effects.  Denies nausea, vomiting or muscle weakness.    Lab Results  Component Value Date   VD25OH 24.2 (L) 06/07/2022   VD25OH 25.8 (L) 05/03/2021    Obstructive Sleep Apnea Julia Mccoy has a diagnosis of sleep apnea. She reports that she is using a  CPAP regularly.  She reports restless sleep and not sleeping well.  She saw pulmonary last on 06/18/2019   Prediabetes Last A1c was 5.5  Medication(s): not currently on meds FH:  father (DMT2) & several other distant relatives with DMT2  Lab Results  Component Value Date   HGBA1C 5.5 06/07/2022   HGBA1C 5.6 05/03/2021   Lab Results  Component Value Date   INSULIN 15.1 06/07/2022   INSULIN 22.4 05/03/2021   Hypothyroidism Stable.  Does not report symptoms associated with uncontrolled hypothyroidism. Medication(s): Levothyroxine 88 mcg daily.  Currently complaining of fatigue.   Lab Results  Component Value Date   TSH 2.380 06/07/2022   Elevated ferritin Not currently on iron or a multivitamin.   PHYSICAL EXAM:  Blood pressure 128/80, pulse 66, temperature 98.4 F (36.9 C), height 5\' 10"  (1.778 m), weight (!) 329 lb (149.2 kg), SpO2 96 %. Body mass index is 47.21 kg/m.  General: She is overweight, cooperative, alert, well developed, and in no acute distress. PSYCH: Has normal mood, affect and thought process.   Extremities: No edema.  Neurologic: No gross sensory or motor deficits. No tremors or fasciculations noted.    DIAGNOSTIC DATA REVIEWED:  BMET    Component Value Date/Time   NA 137 06/07/2022 1131   K 3.6 06/07/2022 1131   CL 105 06/07/2022 1131   CO2 20 06/07/2022 1131  GLUCOSE 101 (H) 06/07/2022 1131   GLUCOSE 88 11/21/2021 2010   BUN 13 06/07/2022 1131   CREATININE 0.78 06/07/2022 1131   CALCIUM 9.3 06/07/2022 1131   GFRNONAA >60 11/21/2021 2010   GFRAA >60 02/03/2020 1853   Lab Results  Component Value Date   HGBA1C 5.5 06/07/2022   HGBA1C 5.6 05/03/2021   Lab Results  Component Value Date   INSULIN 15.1 06/07/2022   INSULIN 22.4 05/03/2021   Lab Results  Component Value Date   TSH 2.380 06/07/2022   CBC    Component Value Date/Time   WBC 4.7 06/07/2022 1131   WBC 4.9 11/21/2021 2010   RBC 4.39 06/07/2022 1131   RBC 4.50 11/21/2021  2010   HGB 12.1 06/07/2022 1131   HCT 38.7 06/07/2022 1131   PLT 189 06/07/2022 1131   MCV 88 06/07/2022 1131   MCH 27.6 06/07/2022 1131   MCH 27.8 11/21/2021 2010   MCHC 31.3 (L) 06/07/2022 1131   MCHC 32.1 11/21/2021 2010   RDW 12.7 06/07/2022 1131   Iron Studies    Component Value Date/Time   FERRITIN 216 (H) 06/07/2022 1131   Lipid Panel     Component Value Date/Time   CHOL 171 06/07/2022 1131   TRIG 80 06/07/2022 1131   HDL 44 06/07/2022 1131   LDLCALC 112 (H) 06/07/2022 1131   Hepatic Function Panel     Component Value Date/Time   PROT 7.6 06/07/2022 1131   ALBUMIN 4.3 06/07/2022 1131   AST 14 06/07/2022 1131   ALT 14 06/07/2022 1131   ALKPHOS 59 06/07/2022 1131   BILITOT 0.5 06/07/2022 1131      Component Value Date/Time   TSH 2.380 06/07/2022 1131   Nutritional Lab Results  Component Value Date   VD25OH 24.2 (L) 06/07/2022   VD25OH 25.8 (L) 05/03/2021     ASSESSMENT AND PLAN  TREATMENT PLAN FOR OBESITY:  Recommended Dietary Goals  Julia Mccoy is currently in the action stage of change. As such, her goal is to continue weight management plan. She has agreed to keeping a food journal and adhering to recommended goals of 1200-1300 calories and 75+ protein.  Behavioral Intervention  We discussed the following Behavioral Modification Strategies today: increasing lean protein intake, decreasing simple carbohydrates , avoiding skipping meals, increasing water intake, work on meal planning and preparation, and planning for success.  Additional resources provided today: NA  Recommended Physical Activity Goals  Julia Mccoy has been advised to work up to 150 minutes of moderate intensity aerobic activity a week and strengthening exercises 2-3 times per week for cardiovascular health, weight loss maintenance and preservation of muscle mass.   She has agreed to Think about ways to increase physical activity and Work on scheduling and tracking physical activity.     Pharmacotherapy We discussed various medication options to help Julia Mccoy with her weight loss efforts and we both agreed to come in for IC at next visit.  ASSOCIATED CONDITIONS ADDRESSED TODAY  Action/Plan  Vitamin D deficiency -     Vitamin D (Ergocalciferol); Take 1 capsule (50,000 Units total) by mouth every 7 (seven) days.  Dispense: 4 capsule; Refill: 0 -     Comprehensive metabolic panel -     VITAMIN D 25 Hydroxy (Vit-D Deficiency, Fractures)  Obstructive sleep apnea syndrome -     Comprehensive metabolic panel To contact pulmonary and make a follow up appt to discuss CPAP/daytime sleepiness.  Pre-diabetes -     Comprehensive metabolic panel -  Hemoglobin A1c -     Insulin, random  Elevated ferritin -     CBC with Differential/Platelet -     Comprehensive metabolic panel -     Ferritin -     Vitamin B12  Fatigue, unspecified type -     CBC with Differential/Platelet -     Comprehensive metabolic panel -     TSH  Other specified hypothyroidism -     TSH  Morbid obesity  BMI 45.0-49.9, adult      To come in for IC at next visit.   To contact insurance to discuss coverage-Wegovy, Zepbound-GLP-1    Return in about 4 weeks (around 02/12/2023).Marland Kitchen She was informed of the importance of frequent follow up visits to maximize her success with intensive lifestyle modifications for her multiple health conditions.   ATTESTASTION STATEMENTS:  Reviewed by clinician on day of visit: allergies, medications, problem list, medical history, surgical history, family history, social history, and previous encounter notes.    Ailene Rud. Angelene Rome FNP-C

## 2023-01-15 NOTE — Patient Instructions (Signed)

## 2023-01-16 LAB — TSH: TSH: 2.18 u[IU]/mL (ref 0.450–4.500)

## 2023-01-17 LAB — COMPREHENSIVE METABOLIC PANEL
ALT: 15 IU/L (ref 0–32)
AST: 14 IU/L (ref 0–40)
Albumin/Globulin Ratio: 1.3 (ref 1.2–2.2)
Albumin: 4 g/dL (ref 3.9–4.9)
Alkaline Phosphatase: 56 IU/L (ref 44–121)
BUN/Creatinine Ratio: 21 (ref 9–23)
BUN: 16 mg/dL (ref 6–24)
Bilirubin Total: 0.5 mg/dL (ref 0.0–1.2)
CO2: 19 mmol/L — ABNORMAL LOW (ref 20–29)
Calcium: 9.1 mg/dL (ref 8.7–10.2)
Chloride: 103 mmol/L (ref 96–106)
Creatinine, Ser: 0.78 mg/dL (ref 0.57–1.00)
Globulin, Total: 3.1 g/dL (ref 1.5–4.5)
Glucose: 95 mg/dL (ref 70–99)
Potassium: 3.8 mmol/L (ref 3.5–5.2)
Sodium: 138 mmol/L (ref 134–144)
Total Protein: 7.1 g/dL (ref 6.0–8.5)
eGFR: 94 mL/min/{1.73_m2} (ref >59)

## 2023-01-17 LAB — CBC WITH DIFFERENTIAL/PLATELET
Basophils Absolute: 0 10*3/uL (ref 0.0–0.2)
Basos: 0 %
EOS (ABSOLUTE): 0.1 10*3/uL (ref 0.0–0.4)
Eos: 2 %
Hematocrit: 39.5 % (ref 34.0–46.6)
Hemoglobin: 12.1 g/dL (ref 11.1–15.9)
Immature Grans (Abs): 0 10*3/uL (ref 0.0–0.1)
Immature Granulocytes: 0 %
Lymphocytes Absolute: 2 10*3/uL (ref 0.7–3.1)
Lymphs: 44 %
MCH: 27.5 pg (ref 26.6–33.0)
MCHC: 30.6 g/dL — ABNORMAL LOW (ref 31.5–35.7)
MCV: 90 fL (ref 79–97)
Monocytes Absolute: 0.5 10*3/uL (ref 0.1–0.9)
Monocytes: 11 %
Neutrophils Absolute: 2 10*3/uL (ref 1.4–7.0)
Neutrophils: 43 %
Platelets: 178 10*3/uL (ref 150–450)
RBC: 4.4 x10E6/uL (ref 3.77–5.28)
RDW: 13.1 % (ref 11.7–15.4)
WBC: 4.5 10*3/uL (ref 3.4–10.8)

## 2023-01-17 LAB — INSULIN, RANDOM: INSULIN: 15 u[IU]/mL (ref 2.6–24.9)

## 2023-01-17 LAB — FERRITIN: Ferritin: 120 ng/mL (ref 15–150)

## 2023-01-17 LAB — VITAMIN B12: Vitamin B-12: 421 pg/mL (ref 232–1245)

## 2023-01-17 LAB — VITAMIN D 25 HYDROXY (VIT D DEFICIENCY, FRACTURES): Vit D, 25-Hydroxy: 29.8 ng/mL — ABNORMAL LOW (ref 30.0–100.0)

## 2023-01-17 LAB — HEMOGLOBIN A1C
Est. average glucose Bld gHb Est-mCnc: 111 mg/dL
Hgb A1c MFr Bld: 5.5 % (ref 4.8–5.6)

## 2023-02-12 ENCOUNTER — Ambulatory Visit: Payer: Commercial Managed Care - PPO | Admitting: Nurse Practitioner

## 2023-02-13 ENCOUNTER — Ambulatory Visit (INDEPENDENT_AMBULATORY_CARE_PROVIDER_SITE_OTHER): Payer: Commercial Managed Care - PPO | Admitting: Nurse Practitioner

## 2023-02-13 ENCOUNTER — Telehealth: Payer: Self-pay

## 2023-02-13 ENCOUNTER — Other Ambulatory Visit: Payer: Self-pay

## 2023-02-13 ENCOUNTER — Encounter: Payer: Self-pay | Admitting: Nurse Practitioner

## 2023-02-13 ENCOUNTER — Other Ambulatory Visit (HOSPITAL_BASED_OUTPATIENT_CLINIC_OR_DEPARTMENT_OTHER): Payer: Self-pay

## 2023-02-13 VITALS — BP 143/86 | HR 60 | Temp 97.9°F | Ht 70.0 in | Wt 324.0 lb

## 2023-02-13 DIAGNOSIS — Z6841 Body Mass Index (BMI) 40.0 and over, adult: Secondary | ICD-10-CM

## 2023-02-13 DIAGNOSIS — E559 Vitamin D deficiency, unspecified: Secondary | ICD-10-CM

## 2023-02-13 DIAGNOSIS — R0602 Shortness of breath: Secondary | ICD-10-CM | POA: Diagnosis not present

## 2023-02-13 DIAGNOSIS — G4733 Obstructive sleep apnea (adult) (pediatric): Secondary | ICD-10-CM | POA: Diagnosis not present

## 2023-02-13 MED ORDER — WEGOVY 0.25 MG/0.5ML ~~LOC~~ SOAJ
0.2500 mg | SUBCUTANEOUS | 0 refills | Status: DC
Start: 2023-02-13 — End: 2023-03-13
  Filled 2023-02-13: qty 2, 28d supply, fill #0

## 2023-02-13 NOTE — Patient Instructions (Signed)
What is a GLP-1 Glucagon like peptide-1 (GLP-1) agonists represent a class of medications used to treat type 2 diabetes mellitus and obesity.  GLP-1 medications mimic the action of a hormone called glucagon like peptide 1.  When blood sugar levels start to rise/increase these drugs stimulate the body to produce more insulin.  When that happens, the extra insulin helps to lower the blood sugar levels in the body.  This in returns helps with decreasing cravings.  These medications also slow the movement of food from the stomach into the small intestine.  This in return helps one to full faster and longer.    Diabetic medications: Approved for treatment of diabetes mellitus but does not have full approval for weight loss use Victoza (liraglutide) Ozempic (semaglutide) Mounjaro Trulicity Rybelsus  Weight loss medications: Approved for long-term weight loss use.        Saxenda (liraglutide) Wegovy (semaglutide) Zepbound  Contraindications:  Pancreatitis (active gallstones) Medullary thyroid cancer High triglycerides (>500)-will need labs prior to starting Multiple Endocrine Neoplasia syndrome type 2 (MEN 2) Trying to get pregnant Breastfeeding Use with caution with taking insulin or sulfonylureas (will need to monitor blood sugars for hypoglycemia) Side effects (most common): Most common side effects are nausea, gas, bloating and constipation.  Other possible side effects are headaches, belching, diarrhea, tiredness (fatigue), vomiting, upset stomach, dizziness, heartburn and stomach (abdominal pain).  If you think that you are becoming dehydrated, please inform our office or your primary family provider.  Stop immediately and go to ER if you have any symptoms of a serious allergic reaction including swelling of your face, lips, tongue or throat; problems breathing or swallowing; severe rash or itching; fainting or feeling dizzy; or very rapid heart rate.        Steps to starting your  WegovyT  The office staff will send a prior authorization request to your insurance company for approval. We will send you a mychart message once we hear back from your insurance with a decision.  This can take up to 7-10 business days.   Once your WegovyTis approved, you may then pick up Wegovy pen from your pharmacy.    Learn how to do Wegovy injections on the Wegovy.com website. There is a training video that will walk you through how to safely perform the injection. If you have questions for our clinical staff, please contact our  clinical staff. If you have any symptoms of allergic reaction to WegovyT discontinue immediately and call 911.  1. What should I tell my provider before using WegovyT ? have or have had problems with your pancreas or kidneys. have type 2 diabetes and a history of diabetic retinopathy. have or have had depression, suicidal thoughts, or mental health issues. are pregnant or plan to become pregnant. WegovyT may harm your unborn baby. You should stop using WegovyT 3 months before you plan to become pregnant or if you are breastfeeding or plan to breastfeed. It is not known if WegovyT passes into your breast milk.  2. What is WegovyT and how does it work?  WegovyT is an injectable prescription medication prescribed by your provider to help with your weight loss.  This medicine will be most effective when combined with a reduced calorie diet and physical activity.  WegovyT is not for the treatment of type 2 diabetes mellitus. WegovyT should not be used with other GLP-1 receptor agonist medicines. The addition of WegovyT in  patients treated with insulin has not been evaluated. When initiating WegovyT, consider   reducing the dose of concomitantly administered insulin secretagogues (such as sulfonylureas) or insulin to reduce the risk of  hypoglycemia.  One role of GLP-1 is to send a signal to your brain to tell it you are full. It also slows down stomach emptying which  will make you feel full longer and may help with reducing cravings.   3.  How should I take WegovyT?  Administer WegovyT once weekly, on the same day each week, at any time of day, with or without meals Inject subcutaneously in the abdomen, thigh or upper arm Initiate at 0.25 mg once weekly for 4 weeks. In 4 week intervals, increase the dose until a dose of 2.4 mg is reached (we will discuss with you the dosage at each visit). The maintenance dose of WegovyT is 2.4 mg once weekly.  The dosing schedule of Wegovy is:  0.25 mg per week X 4 weeks 0.5 mg per week X 4 weeks 1.0 mg per week X 4 weeks 1.7 mg per week X 4 weeks 2.4 mg per week   Missed dose   If you miss your injection day, go ahead inject your current dose. You can go >7 days, but not <7 days between injections. You may change your injection day (It must be >7 days). If you miss >2 doses, you can still keep next injection dose the same or follow de-escalation schedule which may minimize GI symptoms.   In patients with type 2 diabetes, monitor blood glucose prior to starting and during WEGOVYT treatment.   Inject your dose of Wegovy under the skin (subcutaneous injection) in your stomach area (abdomen), upper leg (thigh) or upper arm. Do not inject into a vein or a muscle. The injection site should be rotated and not given in the same spot each day. Hold the needle under the skin and count to "10". This will allow all of the medicine to be dispensed under the skin. Always wipe your skin with an alcohol prep pad before injection  Dispose of used pen in an approved sharps container. More practical options that can be put in the trash  to go to the landfill are milk jugs or plastic laundry detergent containers with a screw on lid.  What side effects may I notice from taking WegovyT?  Side effects that usually do not require medical attention (report to our office if they continue or are bothersome): Nausea (most common but  decreases over time in most people as their body gets used to the medicine) Diarrhea Constipation (you may take an over the counter laxative if needed) Headache Decreased appetite Upset stomach Tiredness Dizziness Feeling bloated Hair loss Belching Gas Heartburn  Side effects that you should call 911 as soon as possible Vomiting Stomach pain Fever Yellowing of your skin or eyes  Clay-colored stools Increased heart rate while at rest Low blood sugar  Sudden changes in mood, behaviors, thoughts, feelings, or thoughts of suicide If you get a lump or swelling in your neck, hoarseness, trouble swallowing, or shortness of breath. Allergic reaction such as skin rash, itching, hives, swelling of the face, tongue, or lips  Helpful tips for managing nausea Nausea is a common side effect when first starting WegovyT. If you experience nausea, be sure to connect with your health care provider. He or she will offer guidance on ways to manage it, which may include: Eat bland, low-fat foods, like crackers, toast and rice  Eat foods that contain water, like soups and gelatin  Avoid lying   down after you eat  Go outdoors for fresh air  Eat more slowly    Other important information Do not drop your pen or knock it against hard surfaces  Do not expose your pen to any liquids  If you think that your pen may be damaged, do not try to fix it. Use a new one Keep the pen cap on until you are ready to inject. Your pen will no longer be sterile if you store an unused pen without the cap, if you pull the pen cap off and put it on again, or if the pen cap is missing. This could lead to an infection  Store the WegovyT pen in the refrigerator from 36F to 46F (2C to 8C) If needed, before removing the pen cap, WegovyT can be stored from 8C to 30C (46F to 86F) in the original carton for up to 28 days.  Keep WegovyT in the original carton to protect it from light  Do not freeze  Throw away pen if  WegovyT has been frozen, has been exposed to light or temperatures above 86F (30C), or has been out of the refrigerator for 28 days or longer It's important to properly dispose of your used WegovyT pens. Do not throw the pen away in your household trash. Instead, use an FDA-cleared sharps disposable container or a sturdy household container with a tight-fitting lid, like a heavy duty plastic container.   Wegovy pen training website: https://www.wegovy.com/about-wegovy/how-to-use-the-wegovy-pen.html  Wegovy savings and support link: https://www.wegovy.com/saving-and-support/save-and-support.html                                                                                        

## 2023-02-13 NOTE — Progress Notes (Signed)
Office: [phone]  /  Fax: [phone]  WEIGHT SUMMARY AND BIOMETRICS  Weight Lost Since Last Visit: 5lb  No data recorded  Vitals Temp: 97.9 F (36.6 C) BP: (!) 143/86 Pulse Rate: 60 SpO2: 99 %   Anthropometric Measurements Height: 5\' 10"  (1.778 m) Weight: (!) 324 lb (147 kg) BMI (Calculated): 46.49 Weight at Last Visit: 329lb Weight Lost Since Last Visit: 5lb Starting Weight: 340lb Total Weight Loss (lbs): 16 lb (7.258 kg)   Body Composition  Body Fat %: 50.5 % Fat Mass (lbs): 163.6 lbs Muscle Mass (lbs): 152.4 lbs Total Body Water (lbs): 104.8 lbs Visceral Fat Rating : 17   Other Clinical Data RMR: 2203 Fasting: Yes Labs: No Today's Visit #: 18 Starting Date: 04/20/21     HPI  Chief Complaint: OBESITY  Julia Mccoy is here to discuss her progress with her obesity treatment plan. She is on the the Category 2 Plan and states she is following her eating plan approximately 40 % of the time. She states she is exercising 30 minutes 3 days per week.   Interval History:  Since last office visit she has lost 5 pounds.  She is struggling with meeting protein goals.  She is drinking more water with flavoring and has decreased soda intake.  She is also drinking tea.  She is being more active.     Pharmacotherapy for weight loss: She is not currently taking medications  for medical weight loss.   Previous pharmacotherapy for medical weight loss:  She has tried Topamax in the past for cravings. Doesn't remember why she stopped taking it.   Bariatric surgery:  Patient has not had bariatric surgery.    Shortness of breath with exertion Denies chest pain or palpitations.    Obstructive Sleep Apnea Julia Mccoy has a diagnosis of sleep apnea. She reports that she is using a CPAP regularly.  She reports restless sleep and not sleeping well.  She saw pulmonary last on 06/18/2019.  We discussed after her last visit.  I had asked her to reach out to pulmonary-she has not yet  but plans to contact them to schedule an appt  Vit D deficiency  She hasn't been taking Vit D since her last visit.  Denied side effects.while taking.  Denies nausea, vomiting or muscle weakness.    Lab Results  Component Value Date   VD25OH 29.8 (L) 01/15/2023   VD25OH 24.2 (L) 06/07/2022   VD25OH 25.8 (L) 05/03/2021     PHYSICAL EXAM:  Blood pressure (!) 143/86, pulse 60, temperature 97.9 F (36.6 C), height 5\' 10"  (1.778 m), weight (!) 324 lb (147 kg), last menstrual period 01/24/2023, SpO2 99 %. Body mass index is 46.49 kg/m.  General: She is overweight, cooperative, alert, well developed, and in no acute distress. PSYCH: Has normal mood, affect and thought process.   Extremities: No edema.  Neurologic: No gross sensory or motor deficits. No tremors or fasciculations noted.    DIAGNOSTIC DATA REVIEWED:  BMET    Component Value Date/Time   NA 138 01/15/2023 1156   K 3.8 01/15/2023 1156   CL 103 01/15/2023 1156   CO2 19 (L) 01/15/2023 1156   GLUCOSE 95 01/15/2023 1156   GLUCOSE 88 11/21/2021 2010   BUN 16 01/15/2023 1156   CREATININE 0.78 01/15/2023 1156   CALCIUM 9.1 01/15/2023 1156   GFRNONAA >60 11/21/2021 2010   GFRAA >60 02/03/2020 1853   Lab Results  Component Value Date   HGBA1C 5.5 01/15/2023   HGBA1C  5.6 05/03/2021   Lab Results  Component Value Date   INSULIN 15.0 01/15/2023   INSULIN 22.4 05/03/2021   Lab Results  Component Value Date   TSH 2.180 01/15/2023   CBC    Component Value Date/Time   WBC 4.5 01/15/2023 1156   WBC 4.9 11/21/2021 2010   RBC 4.40 01/15/2023 1156   RBC 4.50 11/21/2021 2010   HGB 12.1 01/15/2023 1156   HCT 39.5 01/15/2023 1156   PLT 178 01/15/2023 1156   MCV 90 01/15/2023 1156   MCH 27.5 01/15/2023 1156   MCH 27.8 11/21/2021 2010   MCHC 30.6 (L) 01/15/2023 1156   MCHC 32.1 11/21/2021 2010   RDW 13.1 01/15/2023 1156   Iron Studies    Component Value Date/Time   FERRITIN 120 01/15/2023 1156   Lipid Panel      Component Value Date/Time   CHOL 171 06/07/2022 1131   TRIG 80 06/07/2022 1131   HDL 44 06/07/2022 1131   LDLCALC 112 (H) 06/07/2022 1131   Hepatic Function Panel     Component Value Date/Time   PROT 7.1 01/15/2023 1156   ALBUMIN 4.0 01/15/2023 1156   AST 14 01/15/2023 1156   ALT 15 01/15/2023 1156   ALKPHOS 56 01/15/2023 1156   BILITOT 0.5 01/15/2023 1156      Component Value Date/Time   TSH 2.180 01/15/2023 1158   Nutritional Lab Results  Component Value Date   VD25OH 29.8 (L) 01/15/2023   VD25OH 24.2 (L) 06/07/2022   VD25OH 25.8 (L) 05/03/2021     ASSESSMENT AND PLAN  TREATMENT PLAN FOR OBESITY:  Recommended Dietary Goals  Julia Mccoy is currently in the action stage of change. As such, her goal is to continue weight management plan. She has agreed to the cat 3 plan.  Behavioral Intervention  We discussed the following Behavioral Modification Strategies today: increasing lean protein intake, decreasing simple carbohydrates , increasing vegetables, increasing lower glycemic fruits, increasing fiber rich foods, avoiding skipping meals, increasing water intake, continue to practice mindfulness when eating, and planning for success.  Additional resources provided today: NA  Recommended Physical Activity Goals  Julia Mccoy has been advised to work up to 150 minutes of moderate intensity aerobic activity a week and strengthening exercises 2-3 times per week for cardiovascular health, weight loss maintenance and preservation of muscle mass.   She has agreed to Think about ways to increase physical activity, Increase physical activity in their day and reduce sedentary time (increase NEAT)., and Work on scheduling and tracking physical activity.    Pharmacotherapy We discussed various medication options to help Julia Mccoy with her weight loss efforts and we both agreed to start [facility] 0.25mg .  side effects discussed.    ASSOCIATED CONDITIONS ADDRESSED  TODAY  Action/Plan  SOB (shortness of breath) [R06.02] Last IC was 1753 on 04/20/21 Today IC was 2203 Improved Continue working on exercise/movement, increasing protein intake and water intake.   Obstructive sleep apnea syndrome To reach out to pulmonary   Continue CPAP nightly.   Vit D def Restart taking Vit D as directed.    Low Vitamin D level contributes to fatigue and are associated with obesity, breast, and colon cancer. She agrees to continue to take prescription Vitamin D @50 ,000 IU every week and will follow-up for routine testing of Vitamin D, at least 2-3 times per year to avoid over-replacement.   Morbid obesity (HCC) -     JXBJYN; Inject 0.25 mg into the skin once a week.  Dispense: 2 mL;  Refill: 0  BMI 45.0-49.9, adult (HCC) -     ZOXWRU; Inject 0.25 mg into the skin once a week.  Dispense: 2 mL; Refill: 0     Goals: Start meal planning Decrease carbs  Labs reviewed in chart from 01/15/23  Return in about 4 weeks (around 03/13/2023).Marland Kitchen She was informed of the importance of frequent follow up visits to maximize her success with intensive lifestyle modifications for her multiple health conditions.   ATTESTASTION STATEMENTS:  Reviewed by clinician on day of visit: allergies, medications, problem list, medical history, surgical history, family history, social history, and previous encounter notes.      Julia Mccoy. Julia Narula FNP-C

## 2023-02-13 NOTE — Telephone Encounter (Signed)
PA submitted through Cover My Meds for [facility]. Awaiting insurance determination. Key: [id number]

## 2023-02-14 NOTE — Telephone Encounter (Signed)
Request Reference Number: [id number]. WEGOVY INJ 0.25MG  is denied due to Plan Exclusion.

## 2023-02-15 ENCOUNTER — Other Ambulatory Visit (HOSPITAL_BASED_OUTPATIENT_CLINIC_OR_DEPARTMENT_OTHER): Payer: Self-pay

## 2023-02-15 ENCOUNTER — Ambulatory Visit (INDEPENDENT_AMBULATORY_CARE_PROVIDER_SITE_OTHER): Payer: Commercial Managed Care - PPO | Admitting: Neurology

## 2023-02-15 ENCOUNTER — Encounter: Payer: Self-pay | Admitting: Neurology

## 2023-02-15 VITALS — BP 106/63 | HR 119 | Ht 71.0 in | Wt 327.8 lb

## 2023-02-15 DIAGNOSIS — G932 Benign intracranial hypertension: Secondary | ICD-10-CM

## 2023-02-15 NOTE — Patient Instructions (Signed)
Follow up with PCP  Return as needed  

## 2023-02-15 NOTE — Progress Notes (Signed)
[facility]  PATIENT: Julia Mccoy DOB: 1976/02/14  REFERRING CLINICIAN: Bryon Lions, PA-C HISTORY FROM: Patient  REASON FOR VISIT: Headaches    HISTORICAL  CHIEF COMPLAINT:  Chief Complaint  Patient presents with   Follow-up    Rm 17, alone No headaches/questions IIH (idiopathic intracranial hypertension   INTERVAL HISTORY 02/15/2023:  Patient presents today for follow-up, she is alone.  She is doing well.  Last visit was in November and since then she has been doing very well no headaches.  Actually she self discontinued Diamox and has not had any headaches.  Currently no complaints or concerns. She is compliant with her CPAP machine.    INTERVAL HISTORY 08/17/2022:  Patient presents today for follow-up, last visit was in May, at that time we discussed compliance with medication and CPAP and also goal of weight loss.  She reported that she is still not adherent to the Diamox because she does have paresthesia in the bilateral hands.  She does use her CPAP consistently but reports some difficulty with weight loss.  In terms of the headaches she reports the headaches are stable, she knows when she is consistent with the Diamox the headaches will stay at bay.  No additional concerns.   INTERVAL HISTORY 02/14/2022:  Patient presents today for follow-up, last visit was in November, at that time plan was to start Diamox 500 mg twice daily.  Patient has been taking the medication inconsistently.  Once taking the medication her headaches will resolve, she will stop the medication and developed headaches later, then restart the medication and after 5 to 7-days headache will resolve.  She did report following with an eye doctor and was told that everything was normal there was no evidence of pressure behind her eyes.  She does also report using her CPAP machine inconsistently.  She is working towards losing weight but she has been challenging for her.     HISTORY OF  PRESENT ILLNESS:  This is a 47 year old woman with past medical history of hypertension, hypothyroidism, sleep apnea on CPAP< anxiety and obesity who is presenting for headache.  Patient stated headache started last year on April, she has pressure type headache on the left side of her forehead, sometimes behind eyes, and sometimes on the right side.  She presented to the ED in April 2021, had a CTA head and neck which was normal and her CT head showed evidence of empty sella.  She was not started on any medication but was told to follow-up with her primary care doctor.  Patient followed-up with her primary care, was started on Effexor but did not take the medication due to concern of side effect.  She still complains of occasional headache lasting 30 minutes to 1 hour, she takes acetaminophen which seemed to resolve the headache.  Last episode was 2 weeks ago.  There are no associated nausea, no vomiting, no migrainous features.  She had a family history of migraine with her mother.     Headache History and Characteristics: Onset: April 2021, went to the ED  Location: All over  Quality: Pressure, aching   Intensity:7 /10.  Duration: last for about 1 hour Migrainous Features: No Photophobia, no phonophobia, no nausea/vomiting.  Aura: No  History of brain injury or tumor: No  Family history: Mother with migraines  Motion sickness: no Cardiac history: no  OTC: tylenol Sleep: Sleep is pretty good, will get about 6 hrs, has CPAP Mood/ Stress: ok  Prior prophylaxis:  Propranolol: No  Verapamil:No TCA: No Topamax: No Depakote: No Effexor: No Cymbalta: No Neurontin:No  Prior abortives: Triptan: No Anti-emetic: No Steroids: No Ergotamine suppository: No  OTHER MEDICAL CONDITIONS: HTN, Hypothyroidism, Sleep apnea, Anxiety    REVIEW OF SYSTEMS: Full 14 system review of systems performed and negative with exception of: as noted in the HPI   ALLERGIES: No Known Allergies  HOME  MEDICATIONS: Outpatient Medications Prior to Visit  Medication Sig Dispense Refill   acetaZOLAMIDE ER (DIAMOX) 500 MG capsule Take 1 capsule (500 mg total) by mouth 2 (two) times daily. 180 capsule 1   amLODipine (NORVASC) 10 MG tablet Take 10 mg by mouth daily.     busPIRone (BUSPAR) 30 MG tablet Take 30 mg by mouth 2 (two) times daily.     levothyroxine (SYNTHROID, LEVOTHROID) 88 MCG tablet Take 88 mcg by mouth daily.     losartan-hydrochlorothiazide (HYZAAR) 50-12.5 MG per tablet Take 1 tablet by mouth daily.     senna-docusate (SENOKOT-S) 8.6-50 MG tablet Take 1 tablet by mouth daily. 30 tablet 0   Vitamin D, Ergocalciferol, (DRISDOL) 1.25 MG (50000 UNIT) CAPS capsule Take 1 capsule (50,000 Units total) by mouth every 7 (seven) days. 4 capsule 0   Semaglutide-Weight Management (WEGOVY) 0.25 MG/0.5ML SOAJ Inject 0.25 mg into the skin once a week. (Patient not taking: Reported on 02/15/2023) 2 mL 0   No facility-administered medications prior to visit.    PAST MEDICAL HISTORY: Past Medical History:  Diagnosis Date   Anxiety    Back pain    Chest pain    Constipation    Depression    Edema of both lower extremities    GERD (gastroesophageal reflux disease)    Hypertension    Hypothyroidism    Joint pain    Obesity    Palpitations    Pre-diabetes    Sleep apnea     PAST SURGICAL HISTORY: Past Surgical History:  Procedure Laterality Date   KELOID EXCISION      FAMILY HISTORY: Family History  Problem Relation Age of Onset   Hypertension Mother    Hyperlipidemia Mother    Depression Mother    Hypertension Father    Hyperlipidemia Father    Diabetes Father    Cancer Father    Heart attack Cousin     SOCIAL HISTORY: Social History   Socioeconomic History   Marital status: Single    Spouse name: Not on file   Number of children: 2   Years of education: Not on file   Highest education level: Not on file  Occupational History   Occupation: Therapist Mental Health   Tobacco Use   Smoking status: Never   Smokeless tobacco: Never  Vaping Use   Vaping Use: Never used  Substance and Sexual Activity   Alcohol use: No   Drug use: No   Sexual activity: Not Currently    Birth control/protection: None  Other Topics Concern   Not on file  Social History Narrative   Not on file   Social Determinants of Health   Financial Resource Strain: Not on file  Food Insecurity: Not on file  Transportation Needs: Not on file  Physical Activity: Not on file  Stress: Not on file  Social Connections: Not on file  Intimate Partner Violence: Not on file    PHYSICAL EXAM  GENERAL EXAM/CONSTITUTIONAL: Vitals:  Vitals:   02/15/23 1026 02/15/23 1042  BP: (!) 169/89 106/63  Pulse: 70 (!) 119  Weight: (!) 327  lb 12.8 oz (148.7 kg)   Height: 5\' 11"  (1.803 m)      Body mass index is 45.72 kg/m. Wt Readings from Last 3 Encounters:  02/15/23 (!) 327 lb 12.8 oz (148.7 kg)  02/13/23 (!) 324 lb (147 kg)  01/15/23 (!) 329 lb (149.2 kg)   Patient is in no distress; well developed, nourished and groomed; neck is supple  MUSCULOSKELETAL: Gait, strength, tone, movements noted in Neurologic exam below  NEUROLOGIC: MENTAL STATUS:      No data to display         awake, alert, oriented to person, place and time recent and remote memory intact normal attention and concentration language fluent, comprehension intact, naming intact fund of knowledge appropriate  CRANIAL NERVE:  2nd, 3rd, 4th, 6th -visual fields full to confrontation, extraocular muscles intact, no nystagmus 5th - facial sensation symmetric 7th - facial strength symmetric 8th - hearing intact 9th - palate elevates symmetrically, uvula midline 11th - shoulder shrug symmetric 12th - tongue protrusion midline  MOTOR:  normal bulk and tone, full strength in the BUE, BLE  SENSORY:  normal and symmetric to light touch  COORDINATION:  finger-nose-finger, fine finger movements  normal  GAIT/STATION:  normal   DIAGNOSTIC DATA (LABS, IMAGING, TESTING) - I reviewed patient records, labs, notes, testing and imaging myself where available.  Lab Results  Component Value Date   WBC 4.5 01/15/2023   HGB 12.1 01/15/2023   HCT 39.5 01/15/2023   MCV 90 01/15/2023   PLT 178 01/15/2023      Component Value Date/Time   NA 138 01/15/2023 1156   K 3.8 01/15/2023 1156   CL 103 01/15/2023 1156   CO2 19 (L) 01/15/2023 1156   GLUCOSE 95 01/15/2023 1156   GLUCOSE 88 11/21/2021 2010   BUN 16 01/15/2023 1156   CREATININE 0.78 01/15/2023 1156   CALCIUM 9.1 01/15/2023 1156   PROT 7.1 01/15/2023 1156   ALBUMIN 4.0 01/15/2023 1156   AST 14 01/15/2023 1156   ALT 15 01/15/2023 1156   ALKPHOS 56 01/15/2023 1156   BILITOT 0.5 01/15/2023 1156   GFRNONAA >60 11/21/2021 2010   GFRAA >60 02/03/2020 1853   Lab Results  Component Value Date   CHOL 171 06/07/2022   HDL 44 06/07/2022   LDLCALC 112 (H) 06/07/2022   TRIG 80 06/07/2022   Lab Results  Component Value Date   HGBA1C 5.5 01/15/2023   Lab Results  Component Value Date   VITAMINB12 421 01/15/2023   Lab Results  Component Value Date   TSH 2.180 01/15/2023    CT HEAD IMPRESSION: 1. No acute intracranial abnormality. 2. Empty sella. While this finding is often incidental in nature and of no clinical significance, this can also be seen in the setting of idiopathic intracranial hypertension. 3. Otherwise unremarkable head CT.    CTA HEAD AND NECK IMPRESSION: Normal CTA of the head and neck. No large vessel occlusion, hemodynamically significant stenosis, or other acute vascular abnormality. No aneurysm.   ASSESSMENT AND PLAN  47 y.o. year old female with past medical history of hypertension, anxiety, sleep apnea on CPAP, obesity who is presenting for follow up for her idiopathic intracranial hypertension.  She is doing well off Diamox, no headaches.  Plan will be for patient to stay off Diamox, she can  continue to follow with PCP and I have advised her to contact me if she has recrudescence of her headaches.  Return as needed.   1. IIH (idiopathic intracranial  hypertension)     Patient Instructions  Follow up with PCP  Return as needed    No orders of the defined types were placed in this encounter.    No orders of the defined types were placed in this encounter.    Return if symptoms worsen or fail to improve.   Windell Norfolk, MD 02/15/2023, 10:44 AM  [facility] [address], [address] [place], [place] [address] [phone]

## 2023-02-19 ENCOUNTER — Other Ambulatory Visit (HOSPITAL_BASED_OUTPATIENT_CLINIC_OR_DEPARTMENT_OTHER): Payer: Self-pay

## 2023-02-20 ENCOUNTER — Other Ambulatory Visit (HOSPITAL_BASED_OUTPATIENT_CLINIC_OR_DEPARTMENT_OTHER): Payer: Self-pay

## 2023-02-22 ENCOUNTER — Other Ambulatory Visit (HOSPITAL_BASED_OUTPATIENT_CLINIC_OR_DEPARTMENT_OTHER): Payer: Self-pay

## 2023-02-22 ENCOUNTER — Other Ambulatory Visit: Payer: Self-pay

## 2023-03-13 ENCOUNTER — Ambulatory Visit (INDEPENDENT_AMBULATORY_CARE_PROVIDER_SITE_OTHER): Payer: Commercial Managed Care - PPO | Admitting: Nurse Practitioner

## 2023-03-13 ENCOUNTER — Encounter: Payer: Self-pay | Admitting: Nurse Practitioner

## 2023-03-13 VITALS — BP 138/83 | HR 65 | Temp 98.5°F | Ht 70.0 in | Wt 327.0 lb

## 2023-03-13 DIAGNOSIS — Z6841 Body Mass Index (BMI) 40.0 and over, adult: Secondary | ICD-10-CM | POA: Diagnosis not present

## 2023-03-13 DIAGNOSIS — E559 Vitamin D deficiency, unspecified: Secondary | ICD-10-CM | POA: Diagnosis not present

## 2023-03-13 MED ORDER — VITAMIN D (ERGOCALCIFEROL) 1.25 MG (50000 UNIT) PO CAPS
50000.0000 [IU] | ORAL_CAPSULE | ORAL | 0 refills | Status: DC
Start: 2023-03-13 — End: 2023-08-16

## 2023-03-13 NOTE — Progress Notes (Signed)
Office: [phone]  /  Fax: [phone]  WEIGHT SUMMARY AND BIOMETRICS  No data recorded Weight Gained Since Last Visit: 3lb   Vitals Temp: 98.5 F (36.9 C) BP: 138/83 Pulse Rate: 65 SpO2: 98 %   Anthropometric Measurements Height: 5\' 10"  (1.778 m) Weight: (!) 327 lb (148.3 kg) BMI (Calculated): 46.92 Weight at Last Visit: 340lb Weight Gained Since Last Visit: 3lb Starting Weight: 340lb Total Weight Loss (lbs): 13 lb (5.897 kg)   Body Composition  Body Fat %: 49.3 % Fat Mass (lbs): 161.6 lbs Muscle Mass (lbs): 157.8 lbs Total Body Water (lbs): 113 lbs Visceral Fat Rating : 17   Other Clinical Data Fasting: No Labs: No Today's Visit #: 19 Starting Date: 04/20/21     HPI  Chief Complaint: OBESITY  Julia Mccoy is here to discuss her progress with her obesity treatment plan. She is on the the Category 2 Plan and states she is following her eating plan approximately 60 % of the time. She states she is exercising 20-30 minutes 2-3 days per week.   Interval History:  Since last office visit she has gained 3 pounds.  She is eating BF:  3 eggs, sausage link 1/2, lunch: wraps with [place] or chicken with tomato and dinner: varies-meat & protein noodles. She is trying to eat more protein.  She is not eating enough vegetables.  She knows she needs to work on water intake.  She started drinking more sodas since her last visit.     Pharmacotherapy for weight loss: She is not currently taking medications  for medical weight loss.  Wasn't able to start [facility] due to cost. She is starting a new job with new insurance June 10th.    Previous pharmacotherapy for medical weight loss:   She has tried Topamax in the past for cravings. Doesn't remember why she stopped taking it.   Bariatric surgery:  Has not had bariatric surgery.    Vit D deficiency  She is taking Vit D 50,000 IU but not consistently.  Denies side effects.  Denies nausea, vomiting or muscle weakness.    Lab  Results  Component Value Date   VD25OH 29.8 (L) 01/15/2023   VD25OH 24.2 (L) 06/07/2022   VD25OH 25.8 (L) 05/03/2021     PHYSICAL EXAM:  Blood pressure 138/83, pulse 65, temperature 98.5 F (36.9 C), height 5\' 10"  (1.778 m), weight (!) 327 lb (148.3 kg), last menstrual period 02/23/2023, SpO2 98 %. Body mass index is 46.92 kg/m.  General: She is overweight, cooperative, alert, well developed, and in no acute distress. PSYCH: Has normal mood, affect and thought process.   Extremities: No edema.  Neurologic: No gross sensory or motor deficits. No tremors or fasciculations noted.    DIAGNOSTIC DATA REVIEWED:  BMET    Component Value Date/Time   NA 138 01/15/2023 1156   K 3.8 01/15/2023 1156   CL 103 01/15/2023 1156   CO2 19 (L) 01/15/2023 1156   GLUCOSE 95 01/15/2023 1156   GLUCOSE 88 11/21/2021 2010   BUN 16 01/15/2023 1156   CREATININE 0.78 01/15/2023 1156   CALCIUM 9.1 01/15/2023 1156   GFRNONAA >60 11/21/2021 2010   GFRAA >60 02/03/2020 1853   Lab Results  Component Value Date   HGBA1C 5.5 01/15/2023   HGBA1C 5.6 05/03/2021   Lab Results  Component Value Date   INSULIN 15.0 01/15/2023   INSULIN 22.4 05/03/2021   Lab Results  Component Value Date   TSH 2.180 01/15/2023   CBC  Component Value Date/Time   WBC 4.5 01/15/2023 1156   WBC 4.9 11/21/2021 2010   RBC 4.40 01/15/2023 1156   RBC 4.50 11/21/2021 2010   HGB 12.1 01/15/2023 1156   HCT 39.5 01/15/2023 1156   PLT 178 01/15/2023 1156   MCV 90 01/15/2023 1156   MCH 27.5 01/15/2023 1156   MCH 27.8 11/21/2021 2010   MCHC 30.6 (L) 01/15/2023 1156   MCHC 32.1 11/21/2021 2010   RDW 13.1 01/15/2023 1156   Iron Studies    Component Value Date/Time   FERRITIN 120 01/15/2023 1156   Lipid Panel     Component Value Date/Time   CHOL 171 06/07/2022 1131   TRIG 80 06/07/2022 1131   HDL 44 06/07/2022 1131   LDLCALC 112 (H) 06/07/2022 1131   Hepatic Function Panel     Component Value Date/Time    PROT 7.1 01/15/2023 1156   ALBUMIN 4.0 01/15/2023 1156   AST 14 01/15/2023 1156   ALT 15 01/15/2023 1156   ALKPHOS 56 01/15/2023 1156   BILITOT 0.5 01/15/2023 1156      Component Value Date/Time   TSH 2.180 01/15/2023 1158   Nutritional Lab Results  Component Value Date   VD25OH 29.8 (L) 01/15/2023   VD25OH 24.2 (L) 06/07/2022   VD25OH 25.8 (L) 05/03/2021     ASSESSMENT AND PLAN  TREATMENT PLAN FOR OBESITY:  Recommended Dietary Goals  Julia Mccoy is currently in the action stage of change. As such, her goal is to continue weight management plan. She has agreed to keeping a food journal and adhering to recommended goals of 1500 calories and 90+ protein.  Behavioral Intervention  We discussed the following Behavioral Modification Strategies today: increasing lean protein intake, decreasing simple carbohydrates , increasing vegetables, increasing lower glycemic fruits, increasing water intake, keeping healthy foods at home, continue to practice mindfulness when eating, and planning for success.  Additional resources provided today: NA  Recommended Physical Activity Goals  Julia Mccoy has been advised to work up to 150 minutes of moderate intensity aerobic activity a week and strengthening exercises 2-3 times per week for cardiovascular health, weight loss maintenance and preservation of muscle mass.   She has agreed to Think about ways to increase daily physical activity and overcoming barriers to exercise, Increase physical activity in their day and reduce sedentary time (increase NEAT)., and Work on scheduling and tracking physical activity.    Pharmacotherapy We discussed various medication options to help Julia Mccoy with her weight loss efforts and we both agreed to consider and discuss her options at her next visit.  ASSOCIATED CONDITIONS ADDRESSED TODAY  Action/Plan  Vitamin D deficiency -     Vitamin D (Ergocalciferol); Take 1 capsule (50,000 Units total) by mouth every 7  (seven) days.  Dispense: 4 capsule; Refill: 0  Low Vitamin D level contributes to fatigue and are associated with obesity, breast, and colon cancer. She agrees to continue to take prescription Vitamin D @50 ,000 IU every week and will follow-up for routine testing of Vitamin D, at least 2-3 times per year to avoid over-replacement.   Morbid obesity (HCC)  BMI 45.0-49.9, adult (HCC)         Return in about 7 weeks (around 05/01/2023).Marland Kitchen She was informed of the importance of frequent follow up visits to maximize her success with intensive lifestyle modifications for her multiple health conditions.   ATTESTASTION STATEMENTS:  Reviewed by clinician on day of visit: allergies, medications, problem list, medical history, surgical history, family history, social history, and  previous encounter notes.     Julia Mccoy. Iman Orourke FNP-C

## 2023-04-30 ENCOUNTER — Ambulatory Visit: Payer: Commercial Managed Care - PPO | Admitting: Nurse Practitioner

## 2023-06-01 DIAGNOSIS — G4733 Obstructive sleep apnea (adult) (pediatric): Secondary | ICD-10-CM | POA: Diagnosis not present

## 2023-06-26 ENCOUNTER — Ambulatory Visit (INDEPENDENT_AMBULATORY_CARE_PROVIDER_SITE_OTHER): Payer: BC Managed Care – PPO | Admitting: Nurse Practitioner

## 2023-06-26 ENCOUNTER — Encounter: Payer: Self-pay | Admitting: Nurse Practitioner

## 2023-06-26 VITALS — BP 118/73 | HR 68 | Temp 98.3°F | Ht 70.0 in | Wt 331.0 lb

## 2023-06-26 DIAGNOSIS — Z6841 Body Mass Index (BMI) 40.0 and over, adult: Secondary | ICD-10-CM

## 2023-06-26 DIAGNOSIS — E559 Vitamin D deficiency, unspecified: Secondary | ICD-10-CM | POA: Diagnosis not present

## 2023-06-26 NOTE — Patient Instructions (Signed)
What is a GLP-1 Glucagon like peptide-1 (GLP-1) agonists represent a class of medications used to treat type 2 diabetes mellitus and obesity.  GLP-1 medications mimic the action of a hormone called glucagon like peptide 1.  When blood sugar levels start to rise/increase these drugs stimulate the body to produce more insulin.  When that happens, the extra insulin helps to lower the blood sugar levels in the body.  This in returns helps with decreasing cravings.  These medications also slow the movement of food from the stomach into the small intestine.  This in return helps one to full faster and longer.    Diabetic medications: Approved for treatment of diabetes mellitus but does not have full approval for weight loss use Victoza (liraglutide) Ozempic (semaglutide) Mounjaro Trulicity Rybelsus  Weight loss medications: Approved for long-term weight loss use.        Saxenda (liraglutide) Wegovy (semaglutide) Zepbound  Contraindications:  Pancreatitis (active gallstones) Medullary thyroid cancer High triglycerides (>500)-will need labs prior to starting Multiple Endocrine Neoplasia syndrome type 2 (MEN 2) Trying to get pregnant Breastfeeding Use with caution with taking insulin or sulfonylureas (will need to monitor blood sugars for hypoglycemia) Side effects (most common): Most common side effects are nausea, gas, bloating and constipation.  Other possible side effects are headaches, belching, diarrhea, tiredness (fatigue), vomiting, upset stomach, dizziness, heartburn and stomach (abdominal pain).  If you think that you are becoming dehydrated, please inform our office or your primary family provider.  Stop immediately and go to ER if you have any symptoms of a serious allergic reaction including swelling of your face, lips, tongue or throat; problems breathing or swallowing; severe rash or itching; fainting or feeling dizzy; or very rapid heart rate.        Steps to starting your  WegovyT  The office staff will send a prior authorization request to your insurance company for approval. We will send you a mychart message once we hear back from your insurance with a decision.  This can take up to 7-10 business days.   Once your WegovyTis approved, you may then pick up Wegovy pen from your pharmacy.    Learn how to do Wegovy injections on the Wegovy.com website. There is a training video that will walk you through how to safely perform the injection. If you have questions for our clinical staff, please contact our  clinical staff. If you have any symptoms of allergic reaction to WegovyT discontinue immediately and call 911.  1. What should I tell my provider before using WegovyT ? have or have had problems with your pancreas or kidneys. have type 2 diabetes and a history of diabetic retinopathy. have or have had depression, suicidal thoughts, or mental health issues. are pregnant or plan to become pregnant. WegovyT may harm your unborn baby. You should stop using WegovyT 3 months before you plan to become pregnant or if you are breastfeeding or plan to breastfeed. It is not known if WegovyT passes into your breast milk.  2. What is WegovyT and how does it work?  WegovyT is an injectable prescription medication prescribed by your provider to help with your weight loss.  This medicine will be most effective when combined with a reduced calorie diet and physical activity.  WegovyT is not for the treatment of type 2 diabetes mellitus. WegovyT should not be used with other GLP-1 receptor agonist medicines. The addition of WegovyT in  patients treated with insulin has not been evaluated. When initiating WegovyT, consider   reducing the dose of concomitantly administered insulin secretagogues (such as sulfonylureas) or insulin to reduce the risk of  hypoglycemia.  One role of GLP-1 is to send a signal to your brain to tell it you are full. It also slows down stomach emptying which  will make you feel full longer and may help with reducing cravings.   3.  How should I take WegovyT?  Administer WegovyT once weekly, on the same day each week, at any time of day, with or without meals Inject subcutaneously in the abdomen, thigh or upper arm Initiate at 0.25 mg once weekly for 4 weeks. In 4 week intervals, increase the dose until a dose of 2.4 mg is reached (we will discuss with you the dosage at each visit). The maintenance dose of WegovyT is 2.4 mg once weekly.  The dosing schedule of Wegovy is:  0.25 mg per week X 4 weeks 0.5 mg per week X 4 weeks 1.0 mg per week X 4 weeks 1.7 mg per week X 4 weeks 2.4 mg per week   Missed dose   If you miss your injection day, go ahead inject your current dose. You can go >7 days, but not <7 days between injections. You may change your injection day (It must be >7 days). If you miss >2 doses, you can still keep next injection dose the same or follow de-escalation schedule which may minimize GI symptoms.   In patients with type 2 diabetes, monitor blood glucose prior to starting and during WEGOVYT treatment.   Inject your dose of Wegovy under the skin (subcutaneous injection) in your stomach area (abdomen), upper leg (thigh) or upper arm. Do not inject into a vein or a muscle. The injection site should be rotated and not given in the same spot each day. Hold the needle under the skin and count to "10". This will allow all of the medicine to be dispensed under the skin. Always wipe your skin with an alcohol prep pad before injection  Dispose of used pen in an approved sharps container. More practical options that can be put in the trash  to go to the landfill are milk jugs or plastic laundry detergent containers with a screw on lid.  What side effects may I notice from taking WegovyT?  Side effects that usually do not require medical attention (report to our office if they continue or are bothersome): Nausea (most common but  decreases over time in most people as their body gets used to the medicine) Diarrhea Constipation (you may take an over the counter laxative if needed) Headache Decreased appetite Upset stomach Tiredness Dizziness Feeling bloated Hair loss Belching Gas Heartburn  Side effects that you should call 911 as soon as possible Vomiting Stomach pain Fever Yellowing of your skin or eyes  Clay-colored stools Increased heart rate while at rest Low blood sugar  Sudden changes in mood, behaviors, thoughts, feelings, or thoughts of suicide If you get a lump or swelling in your neck, hoarseness, trouble swallowing, or shortness of breath. Allergic reaction such as skin rash, itching, hives, swelling of the face, tongue, or lips  Helpful tips for managing nausea Nausea is a common side effect when first starting WegovyT. If you experience nausea, be sure to connect with your health care provider. He or she will offer guidance on ways to manage it, which may include: Eat bland, low-fat foods, like crackers, toast and rice  Eat foods that contain water, like soups and gelatin  Avoid lying   down after you eat  Go outdoors for fresh air  Eat more slowly    Other important information Do not drop your pen or knock it against hard surfaces  Do not expose your pen to any liquids  If you think that your pen may be damaged, do not try to fix it. Use a new one Keep the pen cap on until you are ready to inject. Your pen will no longer be sterile if you store an unused pen without the cap, if you pull the pen cap off and put it on again, or if the pen cap is missing. This could lead to an infection  Store the WegovyT pen in the refrigerator from 36F to 46F (2C to 8C) If needed, before removing the pen cap, WegovyT can be stored from 8C to 30C (46F to 86F) in the original carton for up to 28 days.  Keep WegovyT in the original carton to protect it from light  Do not freeze  Throw away pen if  WegovyT has been frozen, has been exposed to light or temperatures above 86F (30C), or has been out of the refrigerator for 28 days or longer It's important to properly dispose of your used WegovyT pens. Do not throw the pen away in your household trash. Instead, use an FDA-cleared sharps disposable container or a sturdy household container with a tight-fitting lid, like a heavy duty plastic container.   Wegovy pen training website: https://www.wegovy.com/about-wegovy/how-to-use-the-wegovy-pen.html  Wegovy savings and support link: https://www.wegovy.com/saving-and-support/save-and-support.html                                                                                        

## 2023-06-26 NOTE — Progress Notes (Signed)
Office: [phone]  /  Fax: [phone]  WEIGHT SUMMARY AND BIOMETRICS  Weight Lost Since Last Visit: 0  Weight Gained Since Last Visit: 4 lb   Vitals Temp: 98.3 F (36.8 C) BP: 118/73 Pulse Rate: 68 SpO2: 97 %   Anthropometric Measurements Height: 5\' 10"  (1.778 m) Weight: (!) 331 lb (150.1 kg) BMI (Calculated): 47.49 Weight at Last Visit: 327 lb Weight Lost Since Last Visit: 0 Weight Gained Since Last Visit: 4 lb Starting Weight: 340 lb Total Weight Loss (lbs): 9 lb (4.082 kg) Peak Weight: 361 lb   Body Composition  Body Fat %: 48.5 % Fat Mass (lbs): 160.5 lbs Muscle Mass (lbs): 161.8 lbs Total Body Water (lbs): 113.6 lbs Visceral Fat Rating : 16   Other Clinical Data Fasting: no Labs: no Today's Visit #: 20 Starting Date: 04/20/21     HPI  Chief Complaint: OBESITY  Julia Mccoy is here to discuss her progress with her obesity treatment plan. She is on the the Category 2 Plan and states she is following her eating plan approximately 10 % of the time. She states she is exercising walking 20 minutes 2-3 days per week.   Interval History:  She was seen here last on 03/13/23. She has gained 4 lbs since her last visit.  She has not been tracking or following a meal plan.   She is struggling to drink enough water.  She has decreased her soda intake. She is going to the [facility] 2-3 days per week and is walking her dog daily.     Pharmacotherapy for weight loss: She is not currently taking medications  for medical weight loss.   Previous pharmacotherapy for medical weight loss:  She has tried Topamax in the past for cravings. Doesn't remember why she stopped taking it.   Bariatric surgery:  Patient has not had bariatric surgery.   Vit D deficiency  She is not currently taking Vit D.  Taking a MV.  Has taken Vit D 50,000 IU in the past.    Lab Results  Component Value Date   VD25OH 29.8 (L) 01/15/2023   VD25OH 24.2 (L) 06/07/2022   VD25OH 25.8 (L)  05/03/2021      PHYSICAL EXAM:  Blood pressure 118/73, pulse 68, temperature 98.3 F (36.8 C), height 5\' 10"  (1.778 m), weight (!) 331 lb (150.1 kg), SpO2 97%. Body mass index is 47.49 kg/m.  General: She is overweight, cooperative, alert, well developed, and in no acute distress. PSYCH: Has normal mood, affect and thought process.   Extremities: No edema.  Neurologic: No gross sensory or motor deficits. No tremors or fasciculations noted.    DIAGNOSTIC DATA REVIEWED:  BMET    Component Value Date/Time   NA 138 01/15/2023 1156   K 3.8 01/15/2023 1156   CL 103 01/15/2023 1156   CO2 19 (L) 01/15/2023 1156   GLUCOSE 95 01/15/2023 1156   GLUCOSE 88 11/21/2021 2010   BUN 16 01/15/2023 1156   CREATININE 0.78 01/15/2023 1156   CALCIUM 9.1 01/15/2023 1156   GFRNONAA >60 11/21/2021 2010   GFRAA >60 02/03/2020 1853   Lab Results  Component Value Date   HGBA1C 5.5 01/15/2023   HGBA1C 5.6 05/03/2021   Lab Results  Component Value Date   INSULIN 15.0 01/15/2023   INSULIN 22.4 05/03/2021   Lab Results  Component Value Date   TSH 2.180 01/15/2023   CBC    Component Value Date/Time   WBC 4.5 01/15/2023 1156   WBC 4.9  11/21/2021 2010   RBC 4.40 01/15/2023 1156   RBC 4.50 11/21/2021 2010   HGB 12.1 01/15/2023 1156   HCT 39.5 01/15/2023 1156   PLT 178 01/15/2023 1156   MCV 90 01/15/2023 1156   MCH 27.5 01/15/2023 1156   MCH 27.8 11/21/2021 2010   MCHC 30.6 (L) 01/15/2023 1156   MCHC 32.1 11/21/2021 2010   RDW 13.1 01/15/2023 1156   Iron Studies    Component Value Date/Time   FERRITIN 120 01/15/2023 1156   Lipid Panel     Component Value Date/Time   CHOL 171 06/07/2022 1131   TRIG 80 06/07/2022 1131   HDL 44 06/07/2022 1131   LDLCALC 112 (H) 06/07/2022 1131   Hepatic Function Panel     Component Value Date/Time   PROT 7.1 01/15/2023 1156   ALBUMIN 4.0 01/15/2023 1156   AST 14 01/15/2023 1156   ALT 15 01/15/2023 1156   ALKPHOS 56 01/15/2023 1156    BILITOT 0.5 01/15/2023 1156      Component Value Date/Time   TSH 2.180 01/15/2023 1158   Nutritional Lab Results  Component Value Date   VD25OH 29.8 (L) 01/15/2023   VD25OH 24.2 (L) 06/07/2022   VD25OH 25.8 (L) 05/03/2021     ASSESSMENT AND PLAN  TREATMENT PLAN FOR OBESITY:  Recommended Dietary Goals  Julia Mccoy is currently in the action stage of change. As such, her goal is to continue weight management plan. She has agreed to keeping a food journal and adhering to recommended goals of 1700-1800 calories and 90+ protein. Will review at next visit.  Discussed mynetdiary.    Behavioral Intervention  We discussed the following Behavioral Modification Strategies today: increasing lean protein intake, decreasing simple carbohydrates , increasing vegetables, increasing lower glycemic fruits, increasing water intake, work on tracking and journaling calories using tracking application, continue to practice mindfulness when eating, and planning for success.  Additional resources provided today: NA  Recommended Physical Activity Goals  Julia Mccoy has been advised to work up to 150 minutes of moderate intensity aerobic activity a week and strengthening exercises 2-3 times per week for cardiovascular health, weight loss maintenance and preservation of muscle mass.   She has agreed to Think about ways to increase daily physical activity and overcoming barriers to exercise and Increase physical activity in their day and reduce sedentary time (increase NEAT).   Pharmacotherapy We discussed various medication options to help Julia Mccoy with her weight loss efforts and we both agreed to discuss options at next visit.  Patient is interested in starting [facility]. Will discuss at next visit  ASSOCIATED CONDITIONS ADDRESSED TODAY  Action/Plan  Vitamin D deficiency Will check labs at next visit.    Morbid obesity (HCC)  BMI 45.0-49.9, adult (HCC)      Will obtain labs at next visit   Return  in about 2 weeks (around 07/10/2023).Marland Kitchen She was informed of the importance of frequent follow up visits to maximize her success with intensive lifestyle modifications for her multiple health conditions.   ATTESTASTION STATEMENTS:  Reviewed by clinician on day of visit: allergies, medications, problem list, medical history, surgical history, family history, social history, and previous encounter notes.   Time spent on visit including pre-visit chart review and post-visit care and charting was 30 minutes.    Theodis Sato. Jere Vanburen FNP-C

## 2023-07-02 DIAGNOSIS — G4733 Obstructive sleep apnea (adult) (pediatric): Secondary | ICD-10-CM | POA: Diagnosis not present

## 2023-07-10 ENCOUNTER — Ambulatory Visit: Payer: Self-pay | Admitting: Nurse Practitioner

## 2023-07-25 ENCOUNTER — Ambulatory Visit: Payer: Self-pay | Admitting: Nurse Practitioner

## 2023-07-26 ENCOUNTER — Encounter: Payer: Self-pay | Admitting: Nurse Practitioner

## 2023-07-26 ENCOUNTER — Ambulatory Visit: Payer: BC Managed Care – PPO | Admitting: Nurse Practitioner

## 2023-07-26 VITALS — BP 131/75 | HR 84 | Temp 98.1°F | Ht 70.0 in | Wt 333.0 lb

## 2023-07-26 DIAGNOSIS — F339 Major depressive disorder, recurrent, unspecified: Secondary | ICD-10-CM | POA: Diagnosis not present

## 2023-07-26 DIAGNOSIS — Z79899 Other long term (current) drug therapy: Secondary | ICD-10-CM

## 2023-07-26 DIAGNOSIS — E038 Other specified hypothyroidism: Secondary | ICD-10-CM | POA: Diagnosis not present

## 2023-07-26 DIAGNOSIS — E559 Vitamin D deficiency, unspecified: Secondary | ICD-10-CM

## 2023-07-26 DIAGNOSIS — Z6841 Body Mass Index (BMI) 40.0 and over, adult: Secondary | ICD-10-CM

## 2023-07-26 DIAGNOSIS — E7849 Other hyperlipidemia: Secondary | ICD-10-CM | POA: Diagnosis not present

## 2023-07-26 MED ORDER — ESCITALOPRAM OXALATE 10 MG PO TABS
10.0000 mg | ORAL_TABLET | Freq: Every day | ORAL | 0 refills | Status: DC
Start: 2023-07-26 — End: 2023-08-16

## 2023-07-26 NOTE — Progress Notes (Signed)
Office: [phone]  /  Fax: [phone]  WEIGHT SUMMARY AND BIOMETRICS  Weight Lost Since Last Visit: 0lb  Weight Gained Since Last Visit: 2lb   Vitals Temp: 98.1 F (36.7 C) BP: 131/75 Pulse Rate: 84 SpO2: 100 %   Anthropometric Measurements Height: 5\' 10"  (1.778 m) Weight: (!) 333 lb (151 kg) BMI (Calculated): 47.78 Weight at Last Visit: 331lb Weight Lost Since Last Visit: 0lb Weight Gained Since Last Visit: 2lb Starting Weight: 340lb Total Weight Loss (lbs): 7 lb (3.175 kg)   Body Composition  Body Fat %: 52 % Fat Mass (lbs): 173.4 lbs Muscle Mass (lbs): 152.2 lbs Total Body Water (lbs): 109.4 lbs Visceral Fat Rating : 18   Other Clinical Data Fasting: Yes Labs: No Today's Visit #: 21 Starting Date: 04/20/21     HPI  Chief Complaint: OBESITY  Julia Mccoy is here to discuss her progress with her obesity treatment plan. She is on the the Category 2 Plan and states she is following her eating plan approximately 40 % of the time. She states she is exercising 20-30 minutes 3-4 days per week.   Interval History:  Since last office visit she has gained 2 pounds. She contributes her weight gain to increase in sugar.  She notes that she gains weight around her birthday. Tends to get depressed around her birthday and will stress eat.  She has been walking but hasn't been going to the [facility].     Pharmacotherapy for weight loss: She is not currently taking medications  for medical weight loss.    Previous pharmacotherapy for medical weight loss:  She has tried Topamax in the past for cravings and doesn't remember why she stopped taking it.   Bariatric surgery:  Patient has not had bariatric surgery   Vit D deficiency  She is taking Vit D 50,000 IU weekly (not taking on a regular basis).  Denies side effects.  Denies nausea, vomiting or muscle weakness.    Lab Results  Component Value Date   VD25OH 29.8 (L) 01/15/2023   VD25OH 24.2 (L) 06/07/2022   VD25OH  25.8 (L) 05/03/2021    Hypothyroidism Stable.  Does not report symptoms associated with uncontrolled hypothyroidism. Medication(s): Levothyroxine 88 mcg daily(stopped for one month when changing jobs and insurance-has been taking on a regular basis since August)  Lab Results  Component Value Date   TSH 2.180 01/15/2023    Hyperlipidemia Medication(s): never been on  meds.  FH:  mother and father  Lab Results  Component Value Date   CHOL 171 06/07/2022   HDL 44 06/07/2022   LDLCALC 112 (H) 06/07/2022   TRIG 80 06/07/2022   Lab Results  Component Value Date   ALT 15 01/15/2023   AST 14 01/15/2023   ALKPHOS 56 01/15/2023   BILITOT 0.5 01/15/2023   The 10-year ASCVD risk score (Arnett DK, et al., 2019) is: 7.8%   Values used to calculate the score:     Age: 47 years     Sex: Female     Is Non-Hispanic African American: Yes     Diabetic: No     Tobacco smoker: Yes     Systolic Blood Pressure: 131 mmHg     Is BP treated: Yes     HDL Cholesterol: 44 mg/dL     Total Cholesterol: 171 mg/dL   PHYSICAL EXAM:  Blood pressure 131/75, pulse 84, temperature 98.1 F (36.7 C), height 5\' 10"  (1.778 m), weight (!) 333 lb (151 kg), last menstrual  period 07/13/2023, SpO2 100%. Body mass index is 47.78 kg/m.  General: She is overweight, cooperative, alert, well developed, and in no acute distress. PSYCH: Has normal mood, affect and thought process.   Extremities: No edema.  Neurologic: No gross sensory or motor deficits. No tremors or fasciculations noted.    DIAGNOSTIC DATA REVIEWED:  BMET    Component Value Date/Time   NA 138 01/15/2023 1156   K 3.8 01/15/2023 1156   CL 103 01/15/2023 1156   CO2 19 (L) 01/15/2023 1156   GLUCOSE 95 01/15/2023 1156   GLUCOSE 88 11/21/2021 2010   BUN 16 01/15/2023 1156   CREATININE 0.78 01/15/2023 1156   CALCIUM 9.1 01/15/2023 1156   GFRNONAA >60 11/21/2021 2010   GFRAA >60 02/03/2020 1853   Lab Results  Component Value Date   HGBA1C  5.5 01/15/2023   HGBA1C 5.6 05/03/2021   Lab Results  Component Value Date   INSULIN 15.0 01/15/2023   INSULIN 22.4 05/03/2021   Lab Results  Component Value Date   TSH 2.180 01/15/2023   CBC    Component Value Date/Time   WBC 4.5 01/15/2023 1156   WBC 4.9 11/21/2021 2010   RBC 4.40 01/15/2023 1156   RBC 4.50 11/21/2021 2010   HGB 12.1 01/15/2023 1156   HCT 39.5 01/15/2023 1156   PLT 178 01/15/2023 1156   MCV 90 01/15/2023 1156   MCH 27.5 01/15/2023 1156   MCH 27.8 11/21/2021 2010   MCHC 30.6 (L) 01/15/2023 1156   MCHC 32.1 11/21/2021 2010   RDW 13.1 01/15/2023 1156   Iron Studies    Component Value Date/Time   FERRITIN 120 01/15/2023 1156   Lipid Panel     Component Value Date/Time   CHOL 171 06/07/2022 1131   TRIG 80 06/07/2022 1131   HDL 44 06/07/2022 1131   LDLCALC 112 (H) 06/07/2022 1131   Hepatic Function Panel     Component Value Date/Time   PROT 7.1 01/15/2023 1156   ALBUMIN 4.0 01/15/2023 1156   AST 14 01/15/2023 1156   ALT 15 01/15/2023 1156   ALKPHOS 56 01/15/2023 1156   BILITOT 0.5 01/15/2023 1156      Component Value Date/Time   TSH 2.180 01/15/2023 1158   Nutritional Lab Results  Component Value Date   VD25OH 29.8 (L) 01/15/2023   VD25OH 24.2 (L) 06/07/2022   VD25OH 25.8 (L) 05/03/2021     ASSESSMENT AND PLAN  TREATMENT PLAN FOR OBESITY:  Recommended Dietary Goals  Ariannah is currently in the action stage of change. As such, her goal is to continue weight management plan. I have recommended that she track and will review calories and macros at next visit.  Behavioral Intervention  We discussed the following Behavioral Modification Strategies today: continue to work on maintaining a reduced calorie state, getting the recommended amount of protein, incorporating whole foods, making healthy choices, staying well hydrated and practicing mindfulness when eating..  Additional resources provided today: NA  Recommended Physical  Activity Goals  Stacia has been advised to work up to 150 minutes of moderate intensity aerobic activity a week and strengthening exercises 2-3 times per week for cardiovascular health, weight loss maintenance and preservation of muscle mass.   She has agreed to Continue current level of physical activity , Think about enjoyable ways to increase daily physical activity and overcoming barriers to exercise, and Increase physical activity in their day and reduce sedentary time (increase NEAT).   ASSOCIATED CONDITIONS ADDRESSED TODAY  Action/Plan  Vitamin  D deficiency -     VITAMIN D 25 Hydroxy (Vit-D Deficiency, Fractures)  Low Vitamin D level contributes to fatigue and are associated with obesity, breast, and colon cancer. She agrees to continue to take prescription Vitamin D @50 ,000 IU every week and will follow-up for routine testing of Vitamin D, at least 2-3 times per year to avoid over-replacement.   Other specified hypothyroidism -     TSH  Continue to follow up with PCP.  Continue meds as directed  Other hyperlipidemia -     Lipid Panel With LDL/HDL Ratio  Cardiovascular risk and specific lipid/LDL goals reviewed.  We discussed several lifestyle modifications today and Mitsue will continue to work on diet, exercise and weight loss efforts. Orders and follow up as documented in patient record.   Counseling Intensive lifestyle modifications are the first line treatment for this issue. Dietary changes: Increase soluble fiber. Decrease simple carbohydrates. Exercise changes: Moderate to vigorous-intensity aerobic activity 150 minutes per week if tolerated. Lipid-lowering medications: see documented in medical record.   Depression, recurrent (HCC) -     Start Escitalopram Oxalate; Take 1 tablet (10 mg total) by mouth daily.  Dispense: 30 tablet; Refill: 0.  Side effects discussed.  Was prescribed by Dr. Dalbert Garnet in the past but the patient never started taking it.  Is requesting to  start now.    Medication management -     Comprehensive metabolic panel  Morbid obesity (HCC)  BMI 45.0-49.9, adult [facility])      Refer to Dr. Dewaine Conger  Consider [phone] at next visit.    Return in about 3 weeks (around 08/16/2023).Marland Kitchen She was informed of the importance of frequent follow up visits to maximize her success with intensive lifestyle modifications for her multiple health conditions.   ATTESTASTION STATEMENTS:  Reviewed by clinician on day of visit: allergies, medications, problem list, medical history, surgical history, family history, social history, and previous encounter notes.     Theodis Sato. Carl Bleecker FNP-C

## 2023-07-26 NOTE — Patient Instructions (Signed)
Steps to starting your WegovyT  The office staff will send a prior authorization request to your insurance company for approval. We will send you a mychart message once we hear back from your insurance with a decision.  This can take up to 7-10 business days.   Once your WegovyTis approved, you may then pick up Wegovy pen from your pharmacy.    Learn how to do Wegovy injections on the Wegovy.com website. There is a training video that will walk you through how to safely perform the injection. If you have questions for our clinical staff, please contact our  clinical staff. If you have any symptoms of allergic reaction to WegovyT discontinue immediately and call 911.  1. What should I tell my provider before using WegovyT ? have or have had problems with your pancreas or kidneys. have type 2 diabetes and a history of diabetic retinopathy. have or have had depression, suicidal thoughts, or mental health issues. are pregnant or plan to become pregnant. WegovyT may harm your unborn baby. You should stop using WegovyT 3 months before you plan to become pregnant or if you are breastfeeding or plan to breastfeed. It is not known if WegovyT passes into your breast milk.  2. What is WegovyT and how does it work?  WegovyT is an injectable prescription medication prescribed by your provider to help with your weight loss.  This medicine will be most effective when combined with a reduced calorie diet and physical activity.  WegovyT is not for the treatment of type 2 diabetes mellitus. WegovyT should not be used with other GLP-1 receptor agonist medicines. The addition of WegovyT in  patients treated with insulin has not been evaluated. When initiating WegovyT, consider reducing the dose of concomitantly administered insulin secretagogues (such as sulfonylureas) or insulin to reduce the risk of  hypoglycemia.  One role of GLP-1 is to send a signal to your brain to tell it you are full. It also slows down  stomach emptying which will make you feel full longer and may help with reducing cravings.   3.  How should I take WegovyT?  Administer WegovyT once weekly, on the same day each week, at any time of day, with or without meals Inject subcutaneously in the abdomen, thigh or upper arm Initiate at 0.25 mg once weekly for 4 weeks. In 4 week intervals, increase the dose until a dose of 2.4 mg is reached (we will discuss with you the dosage at each visit). The maintenance dose of WegovyT is 2.4 mg once weekly.  The dosing schedule of Wegovy is:  0.25 mg per week X 4 weeks 0.5 mg per week X 4 weeks 1.0 mg per week X 4 weeks 1.7 mg per week X 4 weeks 2.4 mg per week   Missed dose   If you miss your injection day, go ahead inject your current dose. You can go >7 days, but not <7 days between injections. You may change your injection day (It must be >7 days). If you miss >2 doses, you can still keep next injection dose the same or follow de-escalation schedule which may minimize GI symptoms.   In patients with type 2 diabetes, monitor blood glucose prior to starting and during WEGOVYT treatment.   Inject your dose of Wegovy under the skin (subcutaneous injection) in your stomach area (abdomen), upper leg (thigh) or upper arm. Do not inject into a vein or a muscle. The injection site should be rotated and not given in the   same spot each day. Hold the needle under the skin and count to "10". This will allow all of the medicine to be dispensed under the skin. Always wipe your skin with an alcohol prep pad before injection  Dispose of used pen in an approved sharps container. More practical options that can be put in the trash  to go to the landfill are milk jugs or plastic laundry detergent containers with a screw on lid.  What side effects may I notice from taking WegovyT?  Side effects that usually do not require medical attention (report to our office if they continue or are bothersome): Nausea  (most common but decreases over time in most people as their body gets used to the medicine) Diarrhea Constipation (you may take an over the counter laxative if needed) Headache Decreased appetite Upset stomach Tiredness Dizziness Feeling bloated Hair loss Belching Gas Heartburn  Side effects that you should call 911 as soon as possible Vomiting Stomach pain Fever Yellowing of your skin or eyes  Clay-colored stools Increased heart rate while at rest Low blood sugar  Sudden changes in mood, behaviors, thoughts, feelings, or thoughts of suicide If you get a lump or swelling in your neck, hoarseness, trouble swallowing, or shortness of breath. Allergic reaction such as skin rash, itching, hives, swelling of the face, tongue, or lips  Helpful tips for managing nausea Nausea is a common side effect when first starting WegovyT. If you experience nausea, be sure to connect with your health care provider. He or she will offer guidance on ways to manage it, which may include: Eat bland, low-fat foods, like crackers, toast and rice  Eat foods that contain water, like soups and gelatin  Avoid lying down after you eat  Go outdoors for fresh air  Eat more slowly    Other important information Do not drop your pen or knock it against hard surfaces  Do not expose your pen to any liquids  If you think that your pen may be damaged, do not try to fix it. Use a new one Keep the pen cap on until you are ready to inject. Your pen will no longer be sterile if you store an unused pen without the cap, if you pull the pen cap off and put it on again, or if the pen cap is missing. This could lead to an infection  Store the WegovyT pen in the refrigerator from 36F to 46F (2C to 8C) If needed, before removing the pen cap, WegovyT can be stored from 8C to 30C (46F to 86F) in the original carton for up to 28 days.  Keep WegovyT in the original carton to protect it from light  Do not freeze   Throw away pen if WegovyT has been frozen, has been exposed to light or temperatures above 86F (30C), or has been out of the refrigerator for 28 days or longer It's important to properly dispose of your used WegovyT pens. Do not throw the pen away in your household trash. Instead, use an FDA-cleared sharps disposable container or a sturdy household container with a tight-fitting lid, like a heavy duty plastic container.   Wegovy pen training website: https://www.wegovy.com/about-wegovy/how-to-use-the-wegovy-pen.html  Wegovy savings and support link: https://www.wegovy.com/saving-and-support/save-and-support.html    What is a GLP-1 Glucagon like peptide-1 (GLP-1) agonists represent a class of medications used to treat type 2 diabetes mellitus and obesity.  GLP-1 medications mimic the action of a hormone called glucagon like peptide 1.  When blood sugar levels start to   rise/increase these drugs stimulate the body to produce more insulin.  When that happens, the extra insulin helps to lower the blood sugar levels in the body.  This in returns helps with decreasing cravings.  These medications also slow the movement of food from the stomach into the small intestine.  This in return helps one to full faster and longer.   Diabetic medications: Approved for treatment of diabetes mellitus but does not have full approval for weight loss use Victoza (liraglutide) Ozempic (semaglutide) Mounjaro Trulicity Rybelsus  Weight loss medications: Approved for long-term weight loss use.        Saxenda (liraglutide) Wegovy (semaglutide) Zepbound Contraindications:  Pancreatitis (active gallstones) Medullary thyroid cancer High triglycerides (>500)-will need labs prior to starting Multiple Endocrine Neoplasia syndrome type 2 (MEN 2) Trying to get pregnant Breastfeeding Use with caution with taking insulin or sulfonylureas (will need to monitor blood sugars for hypoglycemia) Side effects (most  common): Most common side effects are nausea, gas, bloating and constipation.  Other possible side effects are headaches, belching, diarrhea, tiredness (fatigue), vomiting, upset stomach, dizziness, heartburn and stomach (abdominal pain).  If you think that you are becoming dehydrated, please inform our office or your primary family provider.  Stop immediately and go to ER if you have any symptoms of a serious allergic reaction including swelling of your face, lips, tongue or throat; problems breathing or swallowing; severe rash or itching; fainting or feeling dizzy; or very rapid heart rate.                                                                                             

## 2023-07-27 LAB — COMPREHENSIVE METABOLIC PANEL
ALT: 14 [IU]/L (ref 0–32)
AST: 16 [IU]/L (ref 0–40)
Albumin: 4.2 g/dL (ref 3.9–4.9)
Alkaline Phosphatase: 65 [IU]/L (ref 44–121)
BUN/Creatinine Ratio: 14 (ref 9–23)
BUN: 10 mg/dL (ref 6–24)
Bilirubin Total: 0.5 mg/dL (ref 0.0–1.2)
CO2: 23 mmol/L (ref 20–29)
Calcium: 9.1 mg/dL (ref 8.7–10.2)
Chloride: 101 mmol/L (ref 96–106)
Creatinine, Ser: 0.69 mg/dL (ref 0.57–1.00)
Globulin, Total: 3.6 g/dL (ref 1.5–4.5)
Glucose: 92 mg/dL (ref 70–99)
Potassium: 3.9 mmol/L (ref 3.5–5.2)
Sodium: 140 mmol/L (ref 134–144)
Total Protein: 7.8 g/dL (ref 6.0–8.5)
eGFR: 107 mL/min/{1.73_m2} (ref >59)

## 2023-07-27 LAB — LIPID PANEL WITH LDL/HDL RATIO
Cholesterol, Total: 168 mg/dL (ref 100–199)
HDL: 45 mg/dL (ref >39)
LDL Chol Calc (NIH): 109 mg/dL — ABNORMAL HIGH (ref 0–99)
LDL/HDL Ratio: 2.4 {ratio} (ref 0.0–3.2)
Triglycerides: 72 mg/dL (ref 0–149)
VLDL Cholesterol Cal: 14 mg/dL (ref 5–40)

## 2023-07-27 LAB — VITAMIN D 25 HYDROXY (VIT D DEFICIENCY, FRACTURES): Vit D, 25-Hydroxy: 43.2 ng/mL (ref 30.0–100.0)

## 2023-07-27 LAB — TSH: TSH: 2.46 u[IU]/mL (ref 0.450–4.500)

## 2023-08-01 DIAGNOSIS — G4733 Obstructive sleep apnea (adult) (pediatric): Secondary | ICD-10-CM | POA: Diagnosis not present

## 2023-08-10 DIAGNOSIS — Z0000 Encounter for general adult medical examination without abnormal findings: Secondary | ICD-10-CM | POA: Diagnosis not present

## 2023-08-10 DIAGNOSIS — I10 Essential (primary) hypertension: Secondary | ICD-10-CM | POA: Diagnosis not present

## 2023-08-10 DIAGNOSIS — E039 Hypothyroidism, unspecified: Secondary | ICD-10-CM | POA: Diagnosis not present

## 2023-08-10 DIAGNOSIS — Z23 Encounter for immunization: Secondary | ICD-10-CM | POA: Diagnosis not present

## 2023-08-14 ENCOUNTER — Telehealth (INDEPENDENT_AMBULATORY_CARE_PROVIDER_SITE_OTHER): Payer: Commercial Managed Care - PPO | Admitting: Psychology

## 2023-08-15 DIAGNOSIS — G4733 Obstructive sleep apnea (adult) (pediatric): Secondary | ICD-10-CM | POA: Diagnosis not present

## 2023-08-16 ENCOUNTER — Ambulatory Visit: Payer: BC Managed Care – PPO | Admitting: Nurse Practitioner

## 2023-08-16 ENCOUNTER — Encounter: Payer: Self-pay | Admitting: Nurse Practitioner

## 2023-08-16 ENCOUNTER — Telehealth: Payer: Self-pay

## 2023-08-16 VITALS — BP 129/86 | HR 80 | Temp 97.8°F | Ht 70.0 in | Wt 336.0 lb

## 2023-08-16 DIAGNOSIS — E559 Vitamin D deficiency, unspecified: Secondary | ICD-10-CM | POA: Diagnosis not present

## 2023-08-16 DIAGNOSIS — Z6841 Body Mass Index (BMI) 40.0 and over, adult: Secondary | ICD-10-CM

## 2023-08-16 DIAGNOSIS — F339 Major depressive disorder, recurrent, unspecified: Secondary | ICD-10-CM | POA: Diagnosis not present

## 2023-08-16 DIAGNOSIS — E7849 Other hyperlipidemia: Secondary | ICD-10-CM | POA: Diagnosis not present

## 2023-08-16 MED ORDER — VITAMIN D (ERGOCALCIFEROL) 1.25 MG (50000 UNIT) PO CAPS: 50000.0000 [IU] | ORAL_CAPSULE | ORAL | 0 refills | Status: DC

## 2023-08-16 MED ORDER — WEGOVY 0.25 MG/0.5ML ~~LOC~~ SOAJ: 0.2500 mg | SUBCUTANEOUS | 0 refills | Status: DC

## 2023-08-16 NOTE — Telephone Encounter (Signed)
PA submitted through Cover My Meds for [facility]. Awaiting insurance determination. Key: Julia Mccoy

## 2023-08-16 NOTE — Progress Notes (Signed)
Office: [phone]  /  Fax: [phone]  WEIGHT SUMMARY AND BIOMETRICS  Weight Lost Since Last Visit: 0lb  Weight Gained Since Last Visit: 3lb   Vitals Temp: 97.8 F (36.6 C) BP: 129/86 Pulse Rate: 80 SpO2: 98 %   Anthropometric Measurements Height: 5\' 10"  (1.778 m) Weight: (!) 336 lb (152.4 kg) BMI (Calculated): 48.21 Weight at Last Visit: 333lb Weight Lost Since Last Visit: 0lb Weight Gained Since Last Visit: 3lb Starting Weight: 340lb Total Weight Loss (lbs): 4 lb (1.814 kg)   Body Composition  Body Fat %: 52.7 % Fat Mass (lbs): 177.4 lbs Muscle Mass (lbs): 151.2 lbs Total Body Water (lbs): 112 lbs Visceral Fat Rating : 18   Other Clinical Data Fasting: No Labs: No Today's Visit #: 22 Starting Date: 04/20/21     HPI  Chief Complaint: OBESITY  Julia Mccoy is here to discuss her progress with her obesity treatment plan. She is on the the Category 2 Plan and states she is following her eating plan approximately 50 % of the time. She states she is exercising 20 minutes 2-3 days per week.   Interval History:  Since last office visit she has gained 3 pounds. She is trying to follow the meal plan but would like to discuss other options.  She is skipping breakfast.  She is not tracking her calories/macros.  Would like to start low carb plan.  She is drinking tea, water and trying to replace her sodas with a lower sugar option.     Pharmacotherapy for weight loss: She is not currently taking medications  for medical weight loss.     Previous pharmacotherapy for medical weight loss:  She has tried Topamax in the past for cravings and doesn't remember why she stopped taking it.    Bariatric surgery:  Patient has not had bariatric surgery   Depression Has been prescribed Lexapro by me and Dr. Dalbert Garnet in the past.  I most recently prescribed after last visit.  She elected not to start.  Discussed recently with PCP.  Has appt with Dr. Dewaine Conger on 09/17/23.      Hyperlipidemia Medication(s): none.  FH: mother and father   Lab Results  Component Value Date   CHOL 168 07/26/2023   HDL 45 07/26/2023   LDLCALC 109 (H) 07/26/2023   TRIG 72 07/26/2023   Lab Results  Component Value Date   ALT 14 07/26/2023   AST 16 07/26/2023   ALKPHOS 65 07/26/2023   BILITOT 0.5 07/26/2023   The 10-year ASCVD risk score (Arnett DK, et al., 2019) is: 3.6%   Values used to calculate the score:     Age: 47 years     Sex: Female     Is Non-Hispanic African American: Yes     Diabetic: No     Tobacco smoker: No     Systolic Blood Pressure: 129 mmHg     Is BP treated: Yes     HDL Cholesterol: 45 mg/dL     Total Cholesterol: 168 mg/dL   Vit D deficiency  She is taking Vit D 50,000 IU weekly.  Denies side effects.  Denies nausea, vomiting or muscle weakness.    Lab Results  Component Value Date   VD25OH 43.2 07/26/2023   VD25OH 29.8 (L) 01/15/2023   VD25OH 24.2 (L) 06/07/2022    PHYSICAL EXAM:  Blood pressure 129/86, pulse 80, temperature 97.8 F (36.6 C), height 5\' 10"  (1.778 m), weight (!) 336 lb (152.4 kg), last menstrual period 07/13/2023,  SpO2 98%. Body mass index is 48.21 kg/m.  General: She is overweight, cooperative, alert, well developed, and in no acute distress. PSYCH: Has normal mood, affect and thought process.   Extremities: No edema.  Neurologic: No gross sensory or motor deficits. No tremors or fasciculations noted.    DIAGNOSTIC DATA REVIEWED:  BMET    Component Value Date/Time   NA 140 07/26/2023 1209   K 3.9 07/26/2023 1209   CL 101 07/26/2023 1209   CO2 23 07/26/2023 1209   GLUCOSE 92 07/26/2023 1209   GLUCOSE 88 11/21/2021 2010   BUN 10 07/26/2023 1209   CREATININE 0.69 07/26/2023 1209   CALCIUM 9.1 07/26/2023 1209   GFRNONAA >60 11/21/2021 2010   GFRAA >60 02/03/2020 1853   Lab Results  Component Value Date   HGBA1C 5.5 01/15/2023   HGBA1C 5.6 05/03/2021   Lab Results  Component Value Date   INSULIN  15.0 01/15/2023   INSULIN 22.4 05/03/2021   Lab Results  Component Value Date   TSH 2.460 07/26/2023   CBC    Component Value Date/Time   WBC 4.5 01/15/2023 1156   WBC 4.9 11/21/2021 2010   RBC 4.40 01/15/2023 1156   RBC 4.50 11/21/2021 2010   HGB 12.1 01/15/2023 1156   HCT 39.5 01/15/2023 1156   PLT 178 01/15/2023 1156   MCV 90 01/15/2023 1156   MCH 27.5 01/15/2023 1156   MCH 27.8 11/21/2021 2010   MCHC 30.6 (L) 01/15/2023 1156   MCHC 32.1 11/21/2021 2010   RDW 13.1 01/15/2023 1156   Iron Studies    Component Value Date/Time   FERRITIN 120 01/15/2023 1156   Lipid Panel     Component Value Date/Time   CHOL 168 07/26/2023 1209   TRIG 72 07/26/2023 1209   HDL 45 07/26/2023 1209   LDLCALC 109 (H) 07/26/2023 1209   Hepatic Function Panel     Component Value Date/Time   PROT 7.8 07/26/2023 1209   ALBUMIN 4.2 07/26/2023 1209   AST 16 07/26/2023 1209   ALT 14 07/26/2023 1209   ALKPHOS 65 07/26/2023 1209   BILITOT 0.5 07/26/2023 1209      Component Value Date/Time   TSH 2.460 07/26/2023 1209   Nutritional Lab Results  Component Value Date   VD25OH 43.2 07/26/2023   VD25OH 29.8 (L) 01/15/2023   VD25OH 24.2 (L) 06/07/2022     ASSESSMENT AND PLAN  TREATMENT PLAN FOR OBESITY:  Recommended Dietary Goals  Shaconna is currently in the action stage of change. As such, her goal is to continue weight management plan. She has agreed to following a lower carbohydrate, vegetable and lean protein rich diet plan.  Behavioral Intervention  We discussed the following Behavioral Modification Strategies today: increasing lean protein intake to established goals, increasing water intake , work on tracking and journaling calories using tracking application, reading food labels , keeping healthy foods at home, work on managing stress, creating time for self-care and relaxation, avoiding temptations and identifying enticing environmental cues, and continue to work on  maintaining a reduced calorie state, getting the recommended amount of protein, incorporating whole foods, making healthy choices, staying well hydrated and practicing mindfulness when eating..  Additional resources provided today: NA  Recommended Physical Activity Goals  Shruti has been advised to work up to 150 minutes of moderate intensity aerobic activity a week and strengthening exercises 2-3 times per week for cardiovascular health, weight loss maintenance and preservation of muscle mass.   She has agreed to [employer]  current level of physical activity , Think about enjoyable ways to increase daily physical activity and overcoming barriers to exercise, Increase physical activity in their day and reduce sedentary time (increase NEAT)., and Start strengthening exercises with a goal of 2-3 sessions a week    Pharmacotherapy We discussed various medication options to help Annesophie with her weight loss efforts and we both agreed to start [facility] 0.25mg .  Side effects discussed.  Patient knows not to get pregnant while taking due to possibilities of birth defects.   Contraindications:  Pancreatitis (active gallstones) Medullary thyroid cancer High triglycerides (>500)-will need labs prior to starting Multiple Endocrine Neoplasia syndrome type 2 (MEN 2) Trying to get pregnant Breastfeeding Use with caution with taking insulin or sulfonylureas (will need to monitor blood sugars for hypoglycemia)  ASSOCIATED CONDITIONS ADDRESSED TODAY  Action/Plan  Vitamin D deficiency -     Vitamin D (Ergocalciferol); Take 1 capsule (50,000 Units total) by mouth every 7 (seven) days.  Dispense: 4 capsule; Refill: 0  Low Vitamin D level contributes to fatigue and are associated with obesity, breast, and colon cancer. She agrees to continue to take prescription Vitamin D @50 ,000 IU every week and will follow-up for routine testing of Vitamin D, at least 2-3 times per year to avoid over-replacement.   Other  hyperlipidemia Cardiovascular risk and specific lipid/LDL goals reviewed.  We discussed several lifestyle modifications today and Zaeleigh will continue to work on diet, exercise and weight loss efforts. Orders and follow up as documented in patient record.   Counseling Intensive lifestyle modifications are the first line treatment for this issue. Dietary changes: Increase soluble fiber. Decrease simple carbohydrates. Exercise changes: Moderate to vigorous-intensity aerobic activity 150 minutes per week if tolerated. Lipid-lowering medications: see documented in medical record.   Depression, recurrent (HCC) Keep appt with Dr. Dewaine Conger  Morbid obesity [facility]) -     ZOXWRU; Inject 0.25 mg into the skin once a week.  Dispense: 2 mL; Refill: 0  BMI 45.0-49.9, adult (HCC)      Labs reviewed in chart with patient from 07/26/23   Return in about 3 weeks (around 09/06/2023).Marland Kitchen She was informed of the importance of frequent follow up visits to maximize her success with intensive lifestyle modifications for her multiple health conditions.   ATTESTASTION STATEMENTS:  Reviewed by clinician on day of visit: allergies, medications, problem list, medical history, surgical history, family history, social history, and previous encounter notes.     Theodis Sato. Vollie Brunty FNP-C

## 2023-08-16 NOTE — Patient Instructions (Signed)
What is a GLP-1 Glucagon like peptide-1 (GLP-1) agonists represent a class of medications used to treat type 2 diabetes mellitus and obesity.  GLP-1 medications mimic the action of a hormone called glucagon like peptide 1.  When blood sugar levels start to rise/increase these drugs stimulate the body to produce more insulin.  When that happens, the extra insulin helps to lower the blood sugar levels in the body.  This in returns helps with decreasing cravings.  These medications also slow the movement of food from the stomach into the small intestine.  This in return helps one to full faster and longer.    Diabetic medications: Approved for treatment of diabetes mellitus but does not have full approval for weight loss use Victoza (liraglutide) Ozempic (semaglutide) Mounjaro Trulicity Rybelsus  Weight loss medications: Approved for long-term weight loss use.        Saxenda (liraglutide) Wegovy (semaglutide) Zepbound  Contraindications:  Pancreatitis (active gallstones) Medullary thyroid cancer High triglycerides (>500)-will need labs prior to starting Multiple Endocrine Neoplasia syndrome type 2 (MEN 2) Trying to get pregnant Breastfeeding Use with caution with taking insulin or sulfonylureas (will need to monitor blood sugars for hypoglycemia) Side effects (most common): Most common side effects are nausea, gas, bloating and constipation.  Other possible side effects are headaches, belching, diarrhea, tiredness (fatigue), vomiting, upset stomach, dizziness, heartburn and stomach (abdominal pain).  If you think that you are becoming dehydrated, please inform our office or your primary family provider.  Stop immediately and go to ER if you have any symptoms of a serious allergic reaction including swelling of your face, lips, tongue or throat; problems breathing or swallowing; severe rash or itching; fainting or feeling dizzy; or very rapid heart rate.        Steps to starting your  WegovyT  The office staff will send a prior authorization request to your insurance company for approval. We will send you a mychart message once we hear back from your insurance with a decision.  This can take up to 7-10 business days.   Once your WegovyTis approved, you may then pick up Wegovy pen from your pharmacy.    Learn how to do Wegovy injections on the Wegovy.com website. There is a training video that will walk you through how to safely perform the injection. If you have questions for our clinical staff, please contact our  clinical staff. If you have any symptoms of allergic reaction to WegovyT discontinue immediately and call 911.  1. What should I tell my provider before using WegovyT ? have or have had problems with your pancreas or kidneys. have type 2 diabetes and a history of diabetic retinopathy. have or have had depression, suicidal thoughts, or mental health issues. are pregnant or plan to become pregnant. WegovyT may harm your unborn baby. You should stop using WegovyT 3 months before you plan to become pregnant or if you are breastfeeding or plan to breastfeed. It is not known if WegovyT passes into your breast milk.  2. What is WegovyT and how does it work?  WegovyT is an injectable prescription medication prescribed by your provider to help with your weight loss.  This medicine will be most effective when combined with a reduced calorie diet and physical activity.  WegovyT is not for the treatment of type 2 diabetes mellitus. WegovyT should not be used with other GLP-1 receptor agonist medicines. The addition of WegovyT in  patients treated with insulin has not been evaluated. When initiating WegovyT, consider   reducing the dose of concomitantly administered insulin secretagogues (such as sulfonylureas) or insulin to reduce the risk of  hypoglycemia.  One role of GLP-1 is to send a signal to your brain to tell it you are full. It also slows down stomach emptying which  will make you feel full longer and may help with reducing cravings.   3.  How should I take WegovyT?  Administer WegovyT once weekly, on the same day each week, at any time of day, with or without meals Inject subcutaneously in the abdomen, thigh or upper arm Initiate at 0.25 mg once weekly for 4 weeks. In 4 week intervals, increase the dose until a dose of 2.4 mg is reached (we will discuss with you the dosage at each visit). The maintenance dose of WegovyT is 2.4 mg once weekly.  The dosing schedule of Wegovy is:  0.25 mg per week X 4 weeks 0.5 mg per week X 4 weeks 1.0 mg per week X 4 weeks 1.7 mg per week X 4 weeks 2.4 mg per week   Missed dose   If you miss your injection day, go ahead inject your current dose. You can go >7 days, but not <7 days between injections. You may change your injection day (It must be >7 days). If you miss >2 doses, you can still keep next injection dose the same or follow de-escalation schedule which may minimize GI symptoms.   In patients with type 2 diabetes, monitor blood glucose prior to starting and during WEGOVYT treatment.   Inject your dose of Wegovy under the skin (subcutaneous injection) in your stomach area (abdomen), upper leg (thigh) or upper arm. Do not inject into a vein or a muscle. The injection site should be rotated and not given in the same spot each day. Hold the needle under the skin and count to "10". This will allow all of the medicine to be dispensed under the skin. Always wipe your skin with an alcohol prep pad before injection  Dispose of used pen in an approved sharps container. More practical options that can be put in the trash  to go to the landfill are milk jugs or plastic laundry detergent containers with a screw on lid.  What side effects may I notice from taking WegovyT?  Side effects that usually do not require medical attention (report to our office if they continue or are bothersome): Nausea (most common but  decreases over time in most people as their body gets used to the medicine) Diarrhea Constipation (you may take an over the counter laxative if needed) Headache Decreased appetite Upset stomach Tiredness Dizziness Feeling bloated Hair loss Belching Gas Heartburn  Side effects that you should call 911 as soon as possible Vomiting Stomach pain Fever Yellowing of your skin or eyes  Clay-colored stools Increased heart rate while at rest Low blood sugar  Sudden changes in mood, behaviors, thoughts, feelings, or thoughts of suicide If you get a lump or swelling in your neck, hoarseness, trouble swallowing, or shortness of breath. Allergic reaction such as skin rash, itching, hives, swelling of the face, tongue, or lips  Helpful tips for managing nausea Nausea is a common side effect when first starting WegovyT. If you experience nausea, be sure to connect with your health care provider. He or she will offer guidance on ways to manage it, which may include: Eat bland, low-fat foods, like crackers, toast and rice  Eat foods that contain water, like soups and gelatin  Avoid lying   down after you eat  Go outdoors for fresh air  Eat more slowly    Other important information Do not drop your pen or knock it against hard surfaces  Do not expose your pen to any liquids  If you think that your pen may be damaged, do not try to fix it. Use a new one Keep the pen cap on until you are ready to inject. Your pen will no longer be sterile if you store an unused pen without the cap, if you pull the pen cap off and put it on again, or if the pen cap is missing. This could lead to an infection  Store the WegovyT pen in the refrigerator from 36F to 46F (2C to 8C) If needed, before removing the pen cap, WegovyT can be stored from 8C to 30C (46F to 86F) in the original carton for up to 28 days.  Keep WegovyT in the original carton to protect it from light  Do not freeze  Throw away pen if  WegovyT has been frozen, has been exposed to light or temperatures above 86F (30C), or has been out of the refrigerator for 28 days or longer It's important to properly dispose of your used WegovyT pens. Do not throw the pen away in your household trash. Instead, use an FDA-cleared sharps disposable container or a sturdy household container with a tight-fitting lid, like a heavy duty plastic container.   Wegovy pen training website: https://www.wegovy.com/about-wegovy/how-to-use-the-wegovy-pen.html  Wegovy savings and support link: https://www.wegovy.com/saving-and-support/save-and-support.html                                                                                        

## 2023-08-17 DIAGNOSIS — I10 Essential (primary) hypertension: Secondary | ICD-10-CM | POA: Diagnosis not present

## 2023-08-17 DIAGNOSIS — R072 Precordial pain: Secondary | ICD-10-CM | POA: Diagnosis not present

## 2023-08-17 DIAGNOSIS — R519 Headache, unspecified: Secondary | ICD-10-CM | POA: Diagnosis not present

## 2023-08-17 DIAGNOSIS — R079 Chest pain, unspecified: Secondary | ICD-10-CM | POA: Diagnosis not present

## 2023-08-17 DIAGNOSIS — R0789 Other chest pain: Secondary | ICD-10-CM | POA: Diagnosis not present

## 2023-08-17 DIAGNOSIS — R0602 Shortness of breath: Secondary | ICD-10-CM | POA: Diagnosis not present

## 2023-09-05 ENCOUNTER — Ambulatory Visit: Payer: BC Managed Care – PPO | Admitting: Nurse Practitioner

## 2023-09-07 DIAGNOSIS — G4733 Obstructive sleep apnea (adult) (pediatric): Secondary | ICD-10-CM | POA: Diagnosis not present

## 2023-09-17 ENCOUNTER — Telehealth (INDEPENDENT_AMBULATORY_CARE_PROVIDER_SITE_OTHER): Payer: BC Managed Care – PPO | Admitting: Psychology

## 2023-09-17 DIAGNOSIS — F5089 Other specified eating disorder: Secondary | ICD-10-CM | POA: Diagnosis not present

## 2023-09-17 DIAGNOSIS — F330 Major depressive disorder, recurrent, mild: Secondary | ICD-10-CM | POA: Diagnosis not present

## 2023-09-17 DIAGNOSIS — F411 Generalized anxiety disorder: Secondary | ICD-10-CM
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
## 2023-09-19 DIAGNOSIS — G4733 Obstructive sleep apnea (adult) (pediatric): Secondary | ICD-10-CM | POA: Diagnosis not present

## 2023-09-19 DIAGNOSIS — Z6841 Body Mass Index (BMI) 40.0 and over, adult: Secondary | ICD-10-CM | POA: Diagnosis not present

## 2023-09-20 ENCOUNTER — Telehealth: Payer: Self-pay

## 2023-09-20 ENCOUNTER — Encounter: Payer: Self-pay | Admitting: Nurse Practitioner

## 2023-09-20 ENCOUNTER — Ambulatory Visit: Payer: BC Managed Care – PPO | Admitting: Nurse Practitioner

## 2023-09-20 VITALS — BP 132/81 | HR 78 | Temp 97.5°F | Ht 70.0 in | Wt 337.0 lb

## 2023-09-20 DIAGNOSIS — G4733 Obstructive sleep apnea (adult) (pediatric): Secondary | ICD-10-CM

## 2023-09-20 DIAGNOSIS — Z6841 Body Mass Index (BMI) 40.0 and over, adult: Secondary | ICD-10-CM | POA: Diagnosis not present

## 2023-09-20 MED ORDER — WEGOVY 0.25 MG/0.5ML ~~LOC~~ SOAJ: 0.2500 mg | SUBCUTANEOUS | 0 refills | Status: DC

## 2023-09-20 NOTE — Patient Instructions (Signed)

## 2023-09-20 NOTE — Progress Notes (Signed)
Office: [phone]  /  Fax: [phone]  WEIGHT SUMMARY AND BIOMETRICS  Weight Lost Since Last Visit: 0lb  Weight Gained Since Last Visit: 1lb   Vitals Temp: (!) 97.5 F (36.4 C) BP: 132/81 Pulse Rate: 78 SpO2: 98 %   Anthropometric Measurements Height: 5\' 10"  (1.778 m) Weight: (!) 337 lb (152.9 kg) BMI (Calculated): 48.35 Weight at Last Visit: 336lb Weight Lost Since Last Visit: 0lb Weight Gained Since Last Visit: 1lb Starting Weight: 340lb Total Weight Loss (lbs): 3 lb (1.361 kg)   Body Composition  Body Fat %: 52.2 % Fat Mass (lbs): 176 lbs Muscle Mass (lbs): 153.2 lbs Total Body Water (lbs): 108.4 lbs Visceral Fat Rating : 18   Other Clinical Data Fasting: No Labs: No Today's Visit #: 23 Starting Date: 04/20/21     HPI  Chief Complaint: OBESITY  Julia Mccoy is here to discuss her progress with her obesity treatment plan. She is on the following a lower carbohydrate, vegetable and lean protein rich diet plan and states she is following her eating plan approximately 50 % of the time. She states she is exercising 20 minutes 3-4 days per week.   Interval History:  Since last office visit she has gained 1 pound. She feels that she is "thrown off due to the holiday". She has been exercising more-treadmill-walking.  Trying to go to the [facility] more.  Notes polyphagia and cravings.   BF:  4 eggs with cheese with vegetables  Snack:  none Lunch:  skipping due to work schedule Snack:  crackers Dinner:  protein, some vegetables, carb Drinks:  a lot of tea (1/2 & 1/2), water, occ soda  Pharmacotherapy for weight loss: She is not currently taking a medication for medical weight loss. She has not started [place] yet-waiting for insurance approval.     Previous pharmacotherapy for medical weight loss:  She has tried Topamax in the past for cravings and doesn't remember why she stopped taking it.    Bariatric surgery:  Patient has not had bariatric surgery    Obstructive Sleep Apnea Julia Mccoy has a diagnosis of severe sleep apnea. She saw pulmonary yesterday for follow up and new mask and supplies were ordered.        PHYSICAL EXAM:  Blood pressure 132/81, pulse 78, temperature (!) 97.5 F (36.4 C), height 5\' 10"  (1.778 m), weight (!) 337 lb (152.9 kg), last menstrual period 09/12/2023, SpO2 98%. Body mass index is 48.35 kg/m.  General: She is overweight, cooperative, alert, well developed, and in no acute distress. PSYCH: Has normal mood, affect and thought process.   Extremities: No edema.  Neurologic: No gross sensory or motor deficits. No tremors or fasciculations noted.    DIAGNOSTIC DATA REVIEWED:  BMET    Component Value Date/Time   NA 140 07/26/2023 1209   K 3.9 07/26/2023 1209   CL 101 07/26/2023 1209   CO2 23 07/26/2023 1209   GLUCOSE 92 07/26/2023 1209   GLUCOSE 88 11/21/2021 2010   BUN 10 07/26/2023 1209   CREATININE 0.69 07/26/2023 1209   CALCIUM 9.1 07/26/2023 1209   GFRNONAA >60 11/21/2021 2010   GFRAA >60 02/03/2020 1853   Lab Results  Component Value Date   HGBA1C 5.5 01/15/2023   HGBA1C 5.6 05/03/2021   Lab Results  Component Value Date   INSULIN 15.0 01/15/2023   INSULIN 22.4 05/03/2021   Lab Results  Component Value Date   TSH 2.460 07/26/2023   CBC    Component Value Date/Time  WBC 4.5 01/15/2023 1156   WBC 4.9 11/21/2021 2010   RBC 4.40 01/15/2023 1156   RBC 4.50 11/21/2021 2010   HGB 12.1 01/15/2023 1156   HCT 39.5 01/15/2023 1156   PLT 178 01/15/2023 1156   MCV 90 01/15/2023 1156   MCH 27.5 01/15/2023 1156   MCH 27.8 11/21/2021 2010   MCHC 30.6 (L) 01/15/2023 1156   MCHC 32.1 11/21/2021 2010   RDW 13.1 01/15/2023 1156   Iron Studies    Component Value Date/Time   FERRITIN 120 01/15/2023 1156   Lipid Panel     Component Value Date/Time   CHOL 168 07/26/2023 1209   TRIG 72 07/26/2023 1209   HDL 45 07/26/2023 1209   LDLCALC 109 (H) 07/26/2023 1209   Hepatic Function  Panel     Component Value Date/Time   PROT 7.8 07/26/2023 1209   ALBUMIN 4.2 07/26/2023 1209   AST 16 07/26/2023 1209   ALT 14 07/26/2023 1209   ALKPHOS 65 07/26/2023 1209   BILITOT 0.5 07/26/2023 1209      Component Value Date/Time   TSH 2.460 07/26/2023 1209   Nutritional Lab Results  Component Value Date   VD25OH 43.2 07/26/2023   VD25OH 29.8 (L) 01/15/2023   VD25OH 24.2 (L) 06/07/2022     ASSESSMENT AND PLAN  TREATMENT PLAN FOR OBESITY:  Recommended Dietary Goals  Julia Mccoy is currently in the action stage of change. As such, her goal is to continue weight management plan. She has agreed to keeping a food journal and adhering to recommended goals of 1600-1700 calories and 90+ protein.  I've encouraged her to track and will review calories and macros at next visit.    Behavioral Intervention  We discussed the following Behavioral Modification Strategies today: increasing lean protein intake to established goals, decreasing simple carbohydrates , increasing vegetables, avoiding skipping meals, increasing water intake , reading food labels , keeping healthy foods at home, and continue to work on maintaining a reduced calorie state, getting the recommended amount of protein, incorporating whole foods, making healthy choices, staying well hydrated and practicing mindfulness when eating..  Additional resources provided today: NA  Recommended Physical Activity Goals  Julia Mccoy has been advised to work up to 150 minutes of moderate intensity aerobic activity a week and strengthening exercises 2-3 times per week for cardiovascular health, weight loss maintenance and preservation of muscle mass.   She has agreed to Think about enjoyable ways to increase daily physical activity and overcoming barriers to exercise, Increase physical activity in their day and reduce sedentary time (increase NEAT)., Increase the intensity, frequency or duration of strengthening exercises , and Increase  the intensity, frequency or duration of aerobic exercises     Pharmacotherapy We discussed various medication options to help Julia Mccoy with her weight loss efforts and we both agreed to start [facility] 0.25mg .  Side effects discussed.  Contraindications:  Pancreatitis (active gallstones) Medullary thyroid cancer High triglycerides (>500)-will need labs prior to starting Multiple Endocrine Neoplasia syndrome type 2 (MEN 2) Trying to get pregnant Breastfeeding Use with caution with taking insulin or sulfonylureas (will need to monitor blood sugars for hypoglycemia)  ASSOCIATED CONDITIONS ADDRESSED TODAY  Action/Plan  OSA (obstructive sleep apnea) Continue to follow up with pulmonary.  Use CPAP nightly.    Morbid obesity (HCC) -     UVOZDG; Inject 0.25 mg into the skin once a week.  Dispense: 2 mL; Refill: 0  BMI 45.0-49.9, adult (HCC)         Return  in about 6 weeks (around 11/01/2023).Marland Kitchen She was informed of the importance of frequent follow up visits to maximize her success with intensive lifestyle modifications for her multiple health conditions.   ATTESTASTION STATEMENTS:  Reviewed by clinician on day of visit: allergies, medications, problem list, medical history, surgical history, family history, social history, and previous encounter notes.     Julia Mccoy. Josephina Melcher FNP-C

## 2023-09-20 NOTE — Telephone Encounter (Signed)
[place] resubmitted through Cover My Meds for [facility]. Awaiting insurance determination. Key: [id number]

## 2023-09-24 NOTE — Telephone Encounter (Signed)
Received fax from [facility] that Reginal Lutes has been approved from 09/20/23-01/24/24.

## 2023-09-26 DIAGNOSIS — F411 Generalized anxiety disorder: Secondary | ICD-10-CM | POA: Diagnosis not present

## 2023-10-08 ENCOUNTER — Telehealth (INDEPENDENT_AMBULATORY_CARE_PROVIDER_SITE_OTHER): Payer: BC Managed Care – PPO | Admitting: Psychology

## 2023-10-08 DIAGNOSIS — F411 Generalized anxiety disorder: Secondary | ICD-10-CM

## 2023-10-08 DIAGNOSIS — F330 Major depressive disorder, recurrent, mild: Secondary | ICD-10-CM | POA: Diagnosis not present

## 2023-10-08 DIAGNOSIS — F5089 Other specified eating disorder: Secondary | ICD-10-CM | POA: Diagnosis not present

## 2023-10-08 NOTE — Progress Notes (Signed)
  Office: [phone]  /  Fax: [phone]    Date: October 08, 2023  Appointment Start Time: 10:04am Duration: 19 minutes Provider: Lawerance Cruel, Psy.D. Type of Session: Individual Therapy  Location of Patient: Home (private location) Location of Provider: Provider's Home (private office) Type of Contact: Telepsychological Visit via MyChart Video Visit  Session Content: Julia Mccoy is a 47 y.o. female presenting for a follow-up appointment to address the previously established treatment goal of increasing coping skills.Today's appointment was a telepsychological visit. Julia Mccoy provided verbal consent for today's telepsychological appointment and she is aware she is responsible for securing confidentiality on her end of the session. Prior to proceeding with today's appointment, Julia Mccoy's physical location at the time of this appointment was obtained as well a phone number she could be reached at in the event of technical difficulties. Julia Mccoy and this provider participated in today's telepsychological service.   This provider conducted a brief check-in. Julia Mccoy discussed eating habits have been "up and down." She noted she is focusing on protein intake, but discussed challenges. Psychoeducation regarding triggers for emotional eating was provided. Julia Mccoy was provided a handout, and encouraged to utilize the handout between now and the next appointment to increase awareness of triggers and frequency. Julia Mccoy agreed. This provider also discussed behavioral strategies for specific triggers, such as placing the utensil down when conversing to avoid mindless eating. Julia Mccoy provided verbal consent during today's appointment for this provider to send a handout about triggers via e-mail. Overall, Julia Mccoy was receptive to today's appointment as evidenced by openness to sharing, responsiveness to feedback, and willingness to explore triggers for emotional eating.  Mental Status Examination:  Appearance:  neat Behavior: appropriate to circumstances Mood: neutral Affect: mood congruent Speech: WNL Eye Contact: appropriate Psychomotor Activity: WNL Gait: unable to assess Thought Process: linear, logical, and goal directed and denies suicidal, homicidal, and self-harm ideation, plan and intent  Thought Content/Perception: no hallucinations, delusions, bizarre thinking or behavior endorsed or observed Orientation: AAOx4 Memory/Concentration: intact Insight: fair Judgment: fair  Interventions:  Conducted a brief chart review Conducted a risk assessment Provided empathic reflections and validation Employed supportive psychotherapy interventions to facilitate reduced distress and to improve coping skills with identified stressors Psychoeducation provided regarding triggers for emotional eating behaviors  DSM-5 Diagnosis(es):  F50.89 Other Specified Feeding or Eating Disorder, Emotional Eating Behaviors, F41.1 Generalized Anxiety Disorder, and F33.0 Major Depressive Disorder, Recurrent Episode, Mild  Treatment Goal & Progress: During the initial appointment with this provider, the following treatment goal was established: increase coping skills. Progress is limited, as Julia Mccoy has just begun treatment with this provider; however, she is receptive to the interaction and interventions and rapport is being established.   Plan: The next appointment is scheduled for 10/22/2023 at 8:30am, which will be via MyChart Video Visit. The next session will focus on working towards the established treatment goal. Julia Mccoy agreed to call [facility] to establish care.

## 2023-10-22 ENCOUNTER — Telehealth (INDEPENDENT_AMBULATORY_CARE_PROVIDER_SITE_OTHER): Payer: BC Managed Care – PPO | Admitting: Psychology

## 2023-10-22 DIAGNOSIS — F330 Major depressive disorder, recurrent, mild: Secondary | ICD-10-CM | POA: Diagnosis not present

## 2023-10-22 DIAGNOSIS — F411 Generalized anxiety disorder: Secondary | ICD-10-CM

## 2023-10-22 DIAGNOSIS — F5089 Other specified eating disorder: Secondary | ICD-10-CM

## 2023-10-22 NOTE — Progress Notes (Signed)
  Office: [phone]  /  Fax: [phone]    Date: October 22, 2023  Appointment Start Time: 8:32am Duration: 23 minutes Provider: Wyatt Fire, Psy.D. Type of Session: Individual Therapy  Location of Patient: Home (private location) Location of Provider: Provider's Home (private office) Type of Contact: Telepsychological Visit via MyChart Video Visit  Session Content: Julia Mccoy is a 48 y.o. female presenting for a follow-up appointment to address the previously established treatment goal of increasing coping skills.Today's appointment was a telepsychological visit. [name] provided verbal consent for today's telepsychological appointment and she is aware she is responsible for securing confidentiality on her end of the session. Prior to proceeding with today's appointment, [name]'s physical location at the time of this appointment was obtained as well a phone number she could be reached at in the event of technical difficulties. [name] and this provider participated in today's telepsychological service.   This provider conducted a brief check-in. [name] reported, Just trying to get adjusted after the holidays. She acknowledged a reduction in physical activity. [name] agreed to explore online options for workouts. Reviewed triggers for emotional eating behaviors. [name] was engaged in problem solving to develop a plan to help cope with urges/cravings involving activities to relax, activities to distract, comforting places, people to call and connect with, and activities that help soothe senses. She was observed writing the plan. Of note, a risk assessment was completed. [name] disclosed experiencing fleeting suicidal ideation since the last appointment with this provider. She denied experiencing suicidal plan and intent. She continues to acknowledge understanding regarding the importance of reaching out to trusted individuals and/or emergency resources if she is unable to ensure safety.  Overall, [name] was receptive to today's appointment as evidenced by openness to sharing, responsiveness to feedback, and willingness to implement discussed strategies .  Mental Status Examination:  Appearance: neat Behavior: appropriate to circumstances Mood: neutral Affect: mood congruent Speech: WNL Eye Contact: appropriate Psychomotor Activity: WNL Gait: unable to assess Thought Process: linear, logical, and goal directed and denies suicidal, homicidal, and self-harm ideation, plan and intent during the appointment Thought Content/Perception: no hallucinations, delusions, bizarre thinking or behavior endorsed or observed Orientation: AAOx4 Memory/Concentration: intact Insight: fair Judgment: fair  Interventions:  Conducted a brief chart review Provided empathic reflections and validation Employed supportive psychotherapy interventions to facilitate reduced distress and to improve coping skills with identified stressors Recommended/discussed options for longer-term therapeutic services Engaged pt in problem solving  DSM-5 Diagnosis(es):  F50.89 Other Specified Feeding or Eating Disorder, Emotional Eating Behaviors, F41.1 Generalized Anxiety Disorder, and F33.0 Major Depressive Disorder, Recurrent Episode, Mild  Treatment Goal & Progress: During the initial appointment with this provider, the following treatment goal was established: increase coping skills. [name] has demonstrated progress in her goal as evidenced by increased awareness of hunger patterns and triggers for emotional eating behaviors.  Plan: The next appointment is scheduled for 11/13/2023 at 11am, which will be via MyChart Video Visit. The next session will focus on working towards the established treatment goal. [name] stated she will complete necessary paperwork to establish care with [facility].   Wyatt Fire, PsyD

## 2023-10-26 DIAGNOSIS — Z113 Encounter for screening for infections with a predominantly sexual mode of transmission: Secondary | ICD-10-CM | POA: Diagnosis not present

## 2023-10-26 DIAGNOSIS — Z1231 Encounter for screening mammogram for malignant neoplasm of breast: Secondary | ICD-10-CM | POA: Diagnosis not present

## 2023-10-26 DIAGNOSIS — Z01419 Encounter for gynecological examination (general) (routine) without abnormal findings: Secondary | ICD-10-CM | POA: Diagnosis not present

## 2023-10-31 ENCOUNTER — Ambulatory Visit: Payer: BC Managed Care – PPO | Admitting: Nurse Practitioner

## 2023-11-13 ENCOUNTER — Telehealth (INDEPENDENT_AMBULATORY_CARE_PROVIDER_SITE_OTHER): Payer: BC Managed Care – PPO | Admitting: Psychology

## 2023-11-13 ENCOUNTER — Encounter: Payer: Self-pay | Admitting: Nurse Practitioner

## 2023-11-13 ENCOUNTER — Ambulatory Visit (INDEPENDENT_AMBULATORY_CARE_PROVIDER_SITE_OTHER): Payer: BC Managed Care – PPO | Admitting: Nurse Practitioner

## 2023-11-13 VITALS — BP 132/87 | HR 80 | Temp 97.8°F | Ht 70.0 in | Wt 340.0 lb

## 2023-11-13 DIAGNOSIS — F5089 Other specified eating disorder: Secondary | ICD-10-CM | POA: Diagnosis not present

## 2023-11-13 DIAGNOSIS — Z6841 Body Mass Index (BMI) 40.0 and over, adult: Secondary | ICD-10-CM

## 2023-11-13 DIAGNOSIS — I10 Essential (primary) hypertension: Secondary | ICD-10-CM

## 2023-11-13 DIAGNOSIS — F411 Generalized anxiety disorder: Secondary | ICD-10-CM

## 2023-11-13 DIAGNOSIS — F330 Major depressive disorder, recurrent, mild: Secondary | ICD-10-CM | POA: Diagnosis not present

## 2023-11-13 NOTE — Progress Notes (Signed)
Office: [phone]  /  Fax: [phone]  WEIGHT SUMMARY AND BIOMETRICS  Weight Lost Since Last Visit: 0lb  Weight Gained Since Last Visit: 3lb   Vitals Temp: 97.8 F (36.6 C) BP: 132/87 Pulse Rate: 80 SpO2: 98 %   Anthropometric Measurements Height: 5\' 10"  (1.778 m) Weight: (!) 340 lb (154.2 kg) BMI (Calculated): 48.78 Weight at Last Visit: 337lb Weight Lost Since Last Visit: 0lb Weight Gained Since Last Visit: 3lb Starting Weight: 340lb Total Weight Loss (lbs): 0 lb (0 kg)   Body Composition  Body Fat %: 52.1 % Fat Mass (lbs): 177.2 lbs Muscle Mass (lbs): 154.6 lbs Total Body Water (lbs): 108.8 lbs Visceral Fat Rating : 18   Other Clinical Data Fasting: No Labs: No Today's Visit #: 24 Starting Date: 04/20/21     HPI  Chief Complaint: OBESITY  Julia Mccoy is here to discuss her progress with her obesity treatment plan. She is on the following a lower carbohydrate, vegetable and lean protein rich diet plan and states she is following her eating plan approximately 60 % of the time. She states she is exercising 15-20 minutes 3-4 days per week.   Interval History:  Since last office visit she has gained 3 pounds.  She is not currently tracking her calories and macros.  She is walking 3-4 days per week.    Her highest was 360lbs.      Pharmacotherapy for weight loss: She is not currently taking a medication for medical weight loss. She has not started [facility] yet.    Julia Mccoy has been approved from 09/20/23-01/24/24.    Previous pharmacotherapy for medical weight loss:  She has tried Topamax in the past for cravings and doesn't remember why she stopped taking it.    Bariatric Mccoy:  Patient has not had bariatric Mccoy   Hypertension Hypertension stable.  Medication(s): Norvasc 10mg , Hyzaar 50-12.5 mg. Denies side effects.   Denies chest pain, palpitations and SOB. FH: both gm and gf, mother and father  BP Readings from Last 3 Encounters:   11/13/23 132/87  09/20/23 132/81  08/16/23 129/86   Lab Results  Component Value Date   CREATININE 0.69 07/26/2023   CREATININE 0.78 01/15/2023   CREATININE 0.78 06/07/2022       PHYSICAL EXAM:  Blood pressure 132/87, pulse 80, temperature 97.8 F (36.6 C), height 5\' 10"  (1.778 m), weight (!) 340 lb (154.2 kg), last menstrual period 11/10/2023, SpO2 98%. Body mass index is 48.78 kg/m.  General: She is overweight, cooperative, alert, well developed, and in no acute distress. PSYCH: Has normal mood, affect and thought process.   Extremities: No edema.  Neurologic: No gross sensory or motor deficits. No tremors or fasciculations noted.    DIAGNOSTIC DATA REVIEWED:  BMET    Component Value Date/Time   NA 140 07/26/2023 1209   K 3.9 07/26/2023 1209   CL 101 07/26/2023 1209   CO2 23 07/26/2023 1209   GLUCOSE 92 07/26/2023 1209   GLUCOSE 88 11/21/2021 2010   BUN 10 07/26/2023 1209   CREATININE 0.69 07/26/2023 1209   CALCIUM 9.1 07/26/2023 1209   GFRNONAA >60 11/21/2021 2010   GFRAA >60 02/03/2020 1853   Lab Results  Component Value Date   HGBA1C 5.5 01/15/2023   HGBA1C 5.6 05/03/2021   Lab Results  Component Value Date   INSULIN 15.0 01/15/2023   INSULIN 22.4 05/03/2021   Lab Results  Component Value Date   TSH 2.460 07/26/2023   CBC    Component  Value Date/Time   WBC 4.5 01/15/2023 1156   WBC 4.9 11/21/2021 2010   RBC 4.40 01/15/2023 1156   RBC 4.50 11/21/2021 2010   HGB 12.1 01/15/2023 1156   HCT 39.5 01/15/2023 1156   PLT 178 01/15/2023 1156   MCV 90 01/15/2023 1156   MCH 27.5 01/15/2023 1156   MCH 27.8 11/21/2021 2010   MCHC 30.6 (L) 01/15/2023 1156   MCHC 32.1 11/21/2021 2010   RDW 13.1 01/15/2023 1156   Iron Studies    Component Value Date/Time   FERRITIN 120 01/15/2023 1156   Lipid Panel     Component Value Date/Time   CHOL 168 07/26/2023 1209   TRIG 72 07/26/2023 1209   HDL 45 07/26/2023 1209   LDLCALC 109 (H) 07/26/2023 1209    Hepatic Function Panel     Component Value Date/Time   PROT 7.8 07/26/2023 1209   ALBUMIN 4.2 07/26/2023 1209   AST 16 07/26/2023 1209   ALT 14 07/26/2023 1209   ALKPHOS 65 07/26/2023 1209   BILITOT 0.5 07/26/2023 1209      Component Value Date/Time   TSH 2.460 07/26/2023 1209   Nutritional Lab Results  Component Value Date   VD25OH 43.2 07/26/2023   VD25OH 29.8 (L) 01/15/2023   VD25OH 24.2 (L) 06/07/2022     ASSESSMENT AND PLAN  TREATMENT PLAN FOR OBESITY:  Recommended Dietary Goals  Julia Mccoy is currently in the action stage of change. As such, her goal is to continue weight management plan. She has agreed to keeping a food journal and adhering to recommended goals of 1600 calories and 100 grams protein-apps:  mynetdiary, myfitnesspal, lose it-will review at next visit.    Behavioral Intervention  We discussed the following Behavioral Modification Strategies today: increasing lean protein intake to established goals, decreasing simple carbohydrates , avoiding skipping meals, increasing water intake , work on meal planning and preparation, reading food labels , keeping healthy foods at home, continue to practice mindfulness when eating, planning for success, better snacking choices, and continue to work on maintaining a reduced calorie state, getting the recommended amount of protein, incorporating whole foods, making healthy choices, staying well hydrated and practicing mindfulness when eating..  Additional resources provided today: NA  Recommended Physical Activity Goals  Julia Mccoy has been advised to work up to 150 minutes of moderate intensity aerobic activity a week and strengthening exercises 2-3 times per week for cardiovascular health, weight loss maintenance and preservation of muscle mass.   She has agreed to Think about enjoyable ways to increase daily physical activity and overcoming barriers to exercise, Increase physical activity in their day and reduce  sedentary time (increase NEAT)., Increase the intensity, frequency or duration of strengthening exercises , and Increase the intensity, frequency or duration of aerobic exercises     Pharmacotherapy We discussed various medication options to help Julia Mccoy with her weight loss efforts and we both agreed to consider her options.  She is more than welcome to bring in [place] and I will be happy to help with the injection.  ASSOCIATED CONDITIONS ADDRESSED TODAY  Action/Plan  Essential hypertension Continue to follow up with PCP.  Take meds as directed  Morbid obesity (HCC)  BMI 45.0-49.9, adult [facility])      Discussed bariatric Mccoy.  To attend or watch seminar with Julia Mccoy.    Return in about 4 weeks (around 12/11/2023).Marland Kitchen She was informed of the importance of frequent follow up visits to maximize her success with intensive lifestyle modifications for  her multiple health conditions.   ATTESTASTION STATEMENTS:  Reviewed by clinician on day of visit: allergies, medications, problem list, medical history, surgical history, family history, social history, and previous encounter notes.   Time spent on visit including pre-visit chart review and post-visit care and charting was 30 minutes.    Theodis Sato. Lucella Pommier FNP-C

## 2023-11-13 NOTE — Patient Instructions (Signed)
Steps to starting your [facility]  The office staff will send a prior authorization request to your insurance company for approval. We will send you a mychart message once we hear back from your insurance with a decision.  This can take up to 7-10 business days.   Once your WegovyTis approved, you may then pick up [facility] pen from your pharmacy.    Learn how to do Wegovy injections on the [email or web address] website. There is a training video that will walk you through how to safely perform the injection. If you have questions for our clinical staff, please contact our  clinical staff. If you have any symptoms of allergic reaction to [facility] discontinue immediately and call 911.  1. What should I tell my provider before using WegovyT ? have or have had problems with your pancreas or kidneys. have type 2 diabetes and a history of diabetic retinopathy. have or have had depression, suicidal thoughts, or mental health issues. are pregnant or plan to become pregnant. Joesphine Bare may harm your unborn baby. You should stop using WegovyT 3 months before you plan to become pregnant or if you are breastfeeding or plan to breastfeed. It is not known if WegovyT passes into your breast milk.  2. What is Joesphine Bare and how does it work?  Joesphine Bare is an injectable prescription medication prescribed by your provider to help with your weight loss.  This medicine will be most effective when combined with a reduced calorie diet and physical activity.  Joesphine Bare is not for the treatment of type 2 diabetes mellitus. Joesphine Bare should not be used with other GLP-1 receptor agonist medicines. The addition of WegovyT in  patients treated with insulin has not been evaluated. When initiating WegovyT, consider reducing the dose of concomitantly administered insulin secretagogues (such as sulfonylureas) or insulin to reduce the risk of  hypoglycemia.  One role of GLP-1 is to send a signal to your brain to tell it you are full. It also slows down  stomach emptying which will make you feel full longer and may help with reducing cravings.   3.  How should I take WegovyT?  Administer WegovyT once weekly, on the same day each week, at any time of day, with or without meals Inject subcutaneously in the abdomen, thigh or upper arm Initiate at 0.25 mg once weekly for 4 weeks. In 4 week intervals, increase the dose until a dose of 2.4 mg is reached (we will discuss with you the dosage at each visit). The maintenance dose of WegovyT is 2.4 mg once weekly.  The dosing schedule of Wegovy is:  0.25 mg per week X 4 weeks 0.5 mg per week X 4 weeks 1.0 mg per week X 4 weeks 1.7 mg per week X 4 weeks 2.4 mg per week   Missed dose   If you miss your injection day, go ahead inject your current dose. You can go >7 days, but not <7 days between injections. You may change your injection day (It must be >7 days). If you miss >2 doses, you can still keep next injection dose the same or follow de-escalation schedule which may minimize GI symptoms.   In patients with type 2 diabetes, monitor blood glucose prior to starting and during WEGOVYT treatment.   Inject your dose of Wegovy under the skin (subcutaneous injection) in your stomach area (abdomen), upper leg (thigh) or upper arm. Do not inject into a vein or a muscle. The injection site should be rotated and not given in the  same spot each day. Hold the needle under the skin and count to "10". This will allow all of the medicine to be dispensed under the skin. Always wipe your skin with an alcohol prep pad before injection  Dispose of used pen in an approved sharps container. More practical options that can be put in the trash  to go to the landfill are milk jugs or plastic laundry detergent containers with a screw on lid.  What side effects may I notice from taking WegovyT?  Side effects that usually do not require medical attention (report to our office if they continue or are bothersome): Nausea  (most common but decreases over time in most people as their body gets used to the medicine) Diarrhea Constipation (you may take an over the counter laxative if needed) Headache Decreased appetite Upset stomach Tiredness Dizziness Feeling bloated Hair loss Belching Gas Heartburn  Side effects that you should call 911 as soon as possible Vomiting Stomach pain Fever Yellowing of your skin or eyes  Clay-colored stools Increased heart rate while at rest Low blood sugar  Sudden changes in mood, behaviors, thoughts, feelings, or thoughts of suicide If you get a lump or swelling in your neck, hoarseness, trouble swallowing, or shortness of breath. Allergic reaction such as skin rash, itching, hives, swelling of the face, tongue, or lips  Helpful tips for managing nausea Nausea is a common side effect when first starting WegovyT. If you experience nausea, be sure to connect with your health care provider. He or she will offer guidance on ways to manage it, which may include: Eat bland, low-fat foods, like crackers, toast and rice  Eat foods that contain water, like soups and gelatin  Avoid lying down after you eat  Go outdoors for fresh air  Eat more slowly    Other important information Do not drop your pen or knock it against hard surfaces  Do not expose your pen to any liquids  If you think that your pen may be damaged, do not try to fix it. Use a new one Keep the pen cap on until you are ready to inject. Your pen will no longer be sterile if you store an unused pen without the cap, if you pull the pen cap off and put it on again, or if the pen cap is missing. This could lead to an infection  Store the [place] pen in the refrigerator from 97F to 28F (2C to 8C) If needed, before removing the pen cap, WegovyT can be stored from 8C to 30C (28F to 85F) in the original carton for up to 28 days.  Keep WegovyT in the original carton to protect it from light  Do not freeze   Throw away pen if WegovyT has been frozen, has been exposed to light or temperatures above 85F (30C), or has been out of the refrigerator for 28 days or longer It's important to properly dispose of your used WegovyT pens. Do not throw the pen away in your household trash. Instead, use an FDA-cleared sharps disposable container or a sturdy household container with a tight-fitting lid, like a heavy duty plastic container.   UJWJXB pen training website: NastyThought.uy  Wegovy savings and support link: achegone.com

## 2023-11-13 NOTE — Progress Notes (Signed)
  Office: [phone]  /  Fax: [phone]    Date: November 13, 2023  Appointment Start Time: 11:04am Duration: 17 minutes Provider: Lawerance Cruel, Psy.D. Type of Session: Individual Therapy  Location of Patient: Home (private location) Location of Provider: Provider's Home (private office) Type of Contact: Telepsychological Visit via MyChart Video Visit  Session Content: This provider called Julia Mccoy at 11:03am as she did not present for today's appointment. A HIPAA compliant voicemail was left requesting a call back. She was observed joining shortly after. As such, today's appointment was initiated 4 minutes late. Julia Mccoy is a 48 y.o. female presenting for a follow-up appointment to address the previously established treatment goal of increasing coping skills.Today's appointment was a telepsychological visit. Julia Mccoy provided verbal consent for today's telepsychological appointment and she is aware she is responsible for securing confidentiality on her end of the session. Prior to proceeding with today's appointment, Julia Mccoy physical location at the time of this appointment was obtained as well a phone number she could be reached at in the event of technical difficulties. Julia Mccoy and this provider participated in today's telepsychological service.   This provider conducted a brief check-in. Julia Mccoy stated she is "trying to exercise more." She noted a reduction in engagement in emotional eating behaviors, adding "staying busy" helps. Psychoeducation provided regarding mindful eating strategies (sit down, slowly chew, savor, simplify, and smile). Overall, Julia Mccoy was receptive to today's appointment as evidenced by openness to sharing, responsiveness to feedback, and willingness to continue engaging in mindfulness exercises.  Mental Status Examination:  Appearance: neat Behavior: appropriate to circumstances Mood: neutral Affect: mood congruent Speech: WNL Eye Contact:  appropriate Psychomotor Activity: WNL Gait: unable to assess Thought Process: linear, logical, and goal directed and no evidence or endorsement of suicidal, homicidal, and self-harm ideation, plan and intent  Thought Content/Perception: no hallucinations, delusions, bizarre thinking or behavior endorsed or observed Orientation: AAOx4 Memory/Concentration: intact Insight: good Judgment: good  Interventions:  Conducted a brief chart review Provided empathic reflections and validation Reviewed content from the previous session Provided positive reinforcement Employed supportive psychotherapy interventions to facilitate reduced distress and to improve coping skills with identified stressors Psychoeducation provided regarding mindfulness  DSM-5 Diagnosis(es):  F50.89 Other Specified Feeding or Eating Disorder, Emotional Eating Behaviors, F41.1 Generalized Anxiety Disorder, and F33.0 Major Depressive Disorder, Recurrent Episode, Mild  Treatment Goal & Progress: During the initial appointment with this provider, the following treatment goal was established: increase coping skills. Julia Mccoy has demonstrated progress in her goal as evidenced by increased awareness of hunger patterns, increased awareness of triggers for emotional eating behaviors, and reduction in emotional eating behaviors . Julia Mccoy also continues to demonstrate willingness to engage in learned skill(s).  Plan: The next appointment is scheduled for 12/04/2023 at 11am, which will be via MyChart Video Visit. The next session will focus on working towards the established treatment goal. Julia Mccoy stated she will complete necessary paperwork to establish care with [facility].     Lawerance Cruel, PsyD

## 2023-12-04 ENCOUNTER — Telehealth (INDEPENDENT_AMBULATORY_CARE_PROVIDER_SITE_OTHER): Payer: BC Managed Care – PPO | Admitting: Psychology

## 2023-12-04 ENCOUNTER — Telehealth (INDEPENDENT_AMBULATORY_CARE_PROVIDER_SITE_OTHER): Payer: Self-pay | Admitting: Psychology

## 2023-12-04 NOTE — Progress Notes (Unsigned)
  Office: [phone]  /  Fax: [phone]    Date: December 04, 2023  Appointment Start Time: *** Duration: *** minutes Provider: Lawerance Cruel, Psy.D. Type of Session: Individual Therapy  Location of Patient: {gbptloc:23249} (private location) Location of Provider: Provider's Home (private office) Type of Contact: Telepsychological Visit via MyChart Video Visit  Session Content: This provider called Julia Mccoy at 11:04am as she did not present for today's appointment. A HIPAA compliant voicemail was left requesting a call back. She was observed joining shortly after. As such, today's appointment was initiated *** minutes late.Julia Mccoy is a 48 y.o. female presenting for a follow-up appointment to address the previously established treatment goal of increasing coping skills.Today's appointment was a telepsychological visit. Julia Mccoy provided verbal consent for today's telepsychological appointment and she is aware she is responsible for securing confidentiality on her end of the session. Prior to proceeding with today's appointment, Julia Mccoy's physical location at the time of this appointment was obtained as well a phone number she could be reached at in the event of technical difficulties. Julia Mccoy and this provider participated in today's telepsychological service.   This provider conducted a brief check-in. *** Julia Mccoy was receptive to today's appointment as evidenced by openness to sharing, responsiveness to feedback, and {gbreceptiveness:23401}.  Mental Status Examination:  Appearance: {Appearance:22431} Behavior: {Behavior:22445} Mood: {gbmood:21757} Affect: {Affect:22436} Speech: {Speech:22432} Eye Contact: {Eye Contact:22433} Psychomotor Activity: {Motor Activity:22434} Gait: {gbgait:23404} Thought Process: {thought process:22448}  Thought Content/Perception: {disturbances:22451} Orientation: {Orientation:22437} Memory/Concentration: {gbcognition:22449} Insight:  {Insight:22446} Judgment: {Insight:22446}  Interventions:  {Interventions for Progress Notes:23405}  DSM-5 Diagnosis(es):  F50.89 Other Specified Feeding or Eating Disorder, Emotional Eating Behaviors, F41.1 Generalized Anxiety Disorder, and F33.0 Major Depressive Disorder, Recurrent Episode, Mild  Treatment Goal & Progress: During the initial appointment with this provider, the following treatment goal was established: increase coping skills. [name] has demonstrated progress in her goal as evidenced by {gbtxprogress:22839}. [name] also {gbtxprogress2:22951}.  Plan: The next appointment is scheduled for *** at ***, which will be via MyChart Video Visit. The next session will focus on {Plan for Next Appointment:23400}.   Lawerance Cruel, PsyD

## 2023-12-04 NOTE — Telephone Encounter (Signed)
  Office: [phone]  /  Fax: [phone]  Date of Call: December 04, 2023  Time of Call: 11:04am Provider: Lawerance Cruel, PsyD  CONTENT: This provider called Julia Mccoy to check-in as she did not present for today's MyChart Video Visit appointment. A HIPAA compliant voicemail was left requesting a call back. Of note, this provider stayed on the MyChart Video Visit appointment for 5 minutes prior to signing off per the clinic's grace period policy.    PLAN: This provider will wait for [name] to call back. No further follow-up planned by this provider.

## 2023-12-17 ENCOUNTER — Ambulatory Visit: Payer: BC Managed Care – PPO | Admitting: Nurse Practitioner

## 2024-02-15 DIAGNOSIS — K5900 Constipation, unspecified: Secondary | ICD-10-CM | POA: Diagnosis not present

## 2024-02-15 DIAGNOSIS — R519 Headache, unspecified: Secondary | ICD-10-CM | POA: Diagnosis not present

## 2024-02-15 DIAGNOSIS — F411 Generalized anxiety disorder: Secondary | ICD-10-CM | POA: Diagnosis not present

## 2024-02-15 DIAGNOSIS — Z1330 Encounter for screening examination for mental health and behavioral disorders, unspecified: Secondary | ICD-10-CM | POA: Diagnosis not present

## 2024-03-03 DIAGNOSIS — G4733 Obstructive sleep apnea (adult) (pediatric): Secondary | ICD-10-CM | POA: Diagnosis not present

## 2024-04-02 ENCOUNTER — Ambulatory Visit: Admitting: Nurse Practitioner

## 2024-04-03 ENCOUNTER — Encounter: Payer: Self-pay | Admitting: Nurse Practitioner

## 2024-04-03 ENCOUNTER — Ambulatory Visit: Admitting: Nurse Practitioner

## 2024-04-03 VITALS — BP 146/82 | HR 80 | Temp 98.1°F | Ht 70.0 in | Wt 347.0 lb

## 2024-04-03 DIAGNOSIS — I10 Essential (primary) hypertension: Secondary | ICD-10-CM | POA: Diagnosis not present

## 2024-04-03 DIAGNOSIS — Z6841 Body Mass Index (BMI) 40.0 and over, adult: Secondary | ICD-10-CM

## 2024-04-03 DIAGNOSIS — E66813 Obesity, class 3: Secondary | ICD-10-CM

## 2024-04-03 DIAGNOSIS — G4733 Obstructive sleep apnea (adult) (pediatric): Secondary | ICD-10-CM | POA: Diagnosis not present

## 2024-04-03 NOTE — Progress Notes (Signed)
 Office: [phone]  /  Fax: [phone]  WEIGHT SUMMARY AND BIOMETRICS  Weight Lost Since Last Visit: 0lb  Weight Gained Since Last Visit: 7lb   Vitals Temp: 98.1 F (36.7 C) BP: (!) 146/82 Pulse Rate: 80 SpO2: 99 %   Anthropometric Measurements Height: 5' 10 (1.778 m) Weight: (!) 347 lb (157.4 kg) BMI (Calculated): 49.79 Weight at Last Visit: 340lb Weight Lost Since Last Visit: 0lb Weight Gained Since Last Visit: 7lb Starting Weight: 340lb Total Weight Loss (lbs): 0 lb (0 kg)   Body Composition  Body Fat %: 50.4 % Fat Mass (lbs): 175.2 lbs Muscle Mass (lbs): 163.8 lbs Total Body Water (lbs): 119.4 lbs Visceral Fat Rating : 18   Other Clinical Data Fasting: No Labs: No Today's Visit #: 25 Starting Date: 04/20/21     HPI  Chief Complaint: OBESITY  Julia Mccoy is here to discuss her progress with her obesity treatment plan. She is on the following a lower carbohydrate, vegetable and lean protein rich diet plan and states she is following her eating plan approximately 50 % of the time. She states she is exercising 20-30 minutes 2 days per week.   Interval History:  Since last office visit on 11/13/23 she has gained 7 lbs.  She saw Dr. Delaine Favorite last on 12/04/23.  She is trying to follow low cab and lean protein.  She is eating 2 meals and 1 snack daily. BF:  3 eggs with either deli meat or chicken hotdog or skips.  Lunch:   If skips breakfast will eat 3 eggs with either deli meat or chicken hotdog or skips. She is snacking on crackers,yogurt, cottage cheese or chicken nuggets.  She is drinking water, tea (1/2 & 1/2). She trying to eliminate sodas.   Her highest was 360lbs.   Pharmacotherapy for weight loss: She is not currently taking a medication for medical weight loss.    Previous pharmacotherapy for medical weight loss:  She has tried Topamax in the past for cravings and doesn't remember why she stopped taking it.    Bariatric surgery:  Patient has not had  bariatric surgery    Hypertension Hypertension BP is elevated today.  Medication(s): Norvasc 10mg , Hyzaar 50-12.5mg .  Denies side effects.   Denies chest pain, palpitations and SOB.  BP Readings from Last 3 Encounters:  04/03/24 (!) 146/82  11/13/23 132/87  09/20/23 132/81   Lab Results  Component Value Date   CREATININE 0.69 07/26/2023   CREATININE 0.78 01/15/2023   CREATININE 0.78 06/07/2022     PHYSICAL EXAM:  Blood pressure (!) 146/82, pulse 80, temperature 98.1 F (36.7 C), height 5' 10 (1.778 m), weight (!) 347 lb (157.4 kg), last menstrual period 04/03/2024, SpO2 99%. Body mass index is 49.79 kg/m.  General: She is overweight, cooperative, alert, well developed, and in no acute distress. PSYCH: Has normal mood, affect and thought process.   Extremities: No edema.  Neurologic: No gross sensory or motor deficits. No tremors or fasciculations noted.    DIAGNOSTIC DATA REVIEWED:  BMET    Component Value Date/Time   NA 140 07/26/2023 1209   K 3.9 07/26/2023 1209   CL 101 07/26/2023 1209   CO2 23 07/26/2023 1209   GLUCOSE 92 07/26/2023 1209   GLUCOSE 88 11/21/2021 2010   BUN 10 07/26/2023 1209   CREATININE 0.69 07/26/2023 1209   CALCIUM 9.1 07/26/2023 1209   GFRNONAA >60 11/21/2021 2010   GFRAA >60 02/03/2020 1853   Lab Results  Component Value Date  HGBA1C 5.5 01/15/2023   HGBA1C 5.6 05/03/2021   Lab Results  Component Value Date   INSULIN  15.0 01/15/2023   INSULIN  22.4 05/03/2021   Lab Results  Component Value Date   TSH 2.460 07/26/2023   CBC    Component Value Date/Time   WBC 4.5 01/15/2023 1156   WBC 4.9 11/21/2021 2010   RBC 4.40 01/15/2023 1156   RBC 4.50 11/21/2021 2010   HGB 12.1 01/15/2023 1156   HCT 39.5 01/15/2023 1156   PLT 178 01/15/2023 1156   MCV 90 01/15/2023 1156   MCH 27.5 01/15/2023 1156   MCH 27.8 11/21/2021 2010   MCHC 30.6 (L) 01/15/2023 1156   MCHC 32.1 11/21/2021 2010   RDW 13.1 01/15/2023 1156   Iron Studies     Component Value Date/Time   FERRITIN 120 01/15/2023 1156   Lipid Panel     Component Value Date/Time   CHOL 168 07/26/2023 1209   TRIG 72 07/26/2023 1209   HDL 45 07/26/2023 1209   LDLCALC 109 (H) 07/26/2023 1209   Hepatic Function Panel     Component Value Date/Time   PROT 7.8 07/26/2023 1209   ALBUMIN 4.2 07/26/2023 1209   AST 16 07/26/2023 1209   ALT 14 07/26/2023 1209   ALKPHOS 65 07/26/2023 1209   BILITOT 0.5 07/26/2023 1209      Component Value Date/Time   TSH 2.460 07/26/2023 1209   Nutritional Lab Results  Component Value Date   VD25OH 43.2 07/26/2023   VD25OH 29.8 (L) 01/15/2023   VD25OH 24.2 (L) 06/07/2022     ASSESSMENT AND PLAN  TREATMENT PLAN FOR OBESITY:  Recommended Dietary Goals  Julia Mccoy is currently in the action stage of change. As such, her goal is to continue weight management plan. She has agreed to keeping a food journal and adhering to recommended goals of 1700 calories and 100 grams of protein.  Behavioral Intervention  We discussed the following Behavioral Modification Strategies today: increasing lean protein intake to established goals, decreasing simple carbohydrates , increasing vegetables, increasing fiber rich foods, increasing water intake , and continue to work on maintaining a reduced calorie state, getting the recommended amount of protein, incorporating whole foods, making healthy choices, staying well hydrated and practicing mindfulness when eating..  Additional resources provided today: Cat 4 meal plan, protein content of foods, etc   Recommended Physical Activity Goals  Julia Mccoy has been advised to work up to 150 minutes of moderate intensity aerobic activity a week and strengthening exercises 2-3 times per week for cardiovascular health, weight loss maintenance and preservation of muscle mass.   She has agreed to Think about enjoyable ways to increase daily physical activity and overcoming barriers to exercise, Increase  physical activity in their day and reduce sedentary time (increase NEAT)., and continue to gradually increase the amount and intensity of exercise routine    ASSOCIATED CONDITIONS ADDRESSED TODAY  Action/Plan  Essential hypertension Will continue to monitor.  BP is elevated today.  Continue to follow up with PCP. Take meds as directed  Class 3 severe obesity due to excess calories with body mass index (BMI) of 45.0 to 49.9 in adult      Will obtain labs at next visit.    Return in about 3 weeks (around 04/24/2024).Aaron Aas She was informed of the importance of frequent follow up visits to maximize her success with intensive lifestyle modifications for her multiple health conditions.   ATTESTASTION STATEMENTS:  Reviewed by clinician on day of visit: allergies, medications,  problem list, medical history, surgical history, family history, social history, and previous encounter notes.   Time spent on visit including pre-visit chart review and post-visit care and charting was 30 minutes.    Crist Dominion. Jlyn Bracamonte FNP-C

## 2024-04-07 ENCOUNTER — Ambulatory Visit: Admitting: Nurse Practitioner

## 2024-04-25 DIAGNOSIS — E039 Hypothyroidism, unspecified: Secondary | ICD-10-CM | POA: Diagnosis not present

## 2024-04-25 DIAGNOSIS — I10 Essential (primary) hypertension: Secondary | ICD-10-CM | POA: Diagnosis not present

## 2024-04-25 DIAGNOSIS — K5900 Constipation, unspecified: Secondary | ICD-10-CM | POA: Diagnosis not present

## 2024-04-30 ENCOUNTER — Ambulatory Visit: Admitting: Nurse Practitioner

## 2024-04-30 ENCOUNTER — Encounter: Payer: Self-pay | Admitting: Nurse Practitioner

## 2024-04-30 VITALS — BP 138/83 | HR 76 | Temp 97.8°F | Ht 70.0 in | Wt 347.0 lb

## 2024-04-30 DIAGNOSIS — E66813 Obesity, class 3: Secondary | ICD-10-CM

## 2024-04-30 DIAGNOSIS — Z6841 Body Mass Index (BMI) 40.0 and over, adult: Secondary | ICD-10-CM

## 2024-04-30 DIAGNOSIS — I10 Essential (primary) hypertension: Secondary | ICD-10-CM

## 2024-04-30 NOTE — Progress Notes (Signed)
 Office: [phone]  /  Fax: [phone]  WEIGHT SUMMARY AND BIOMETRICS  Weight Lost Since Last Visit: 0lb  Weight Gained Since Last Visit: 0lb   Vitals Temp: 97.8 F (36.6 C) BP: 138/83 Pulse Rate: 76 SpO2: 98 %   Anthropometric Measurements Height: 5' 10 (1.778 m) Weight: (!) 347 lb (157.4 kg) BMI (Calculated): 49.79 Weight at Last Visit: 347lb Weight Lost Since Last Visit: 0lb Weight Gained Since Last Visit: 0lb Starting Weight: 340lb Total Weight Loss (lbs): 0 lb (0 kg)   Body Composition  Body Fat %: 51.5 % Fat Mass (lbs): 178.8 lbs Muscle Mass (lbs): 159.8 lbs Total Body Water (lbs): 121 lbs Visceral Fat Rating : 18   Other Clinical Data Fasting: No Labs: No Today's Visit #: 26 Starting Date: 04/20/21     HPI  Chief Complaint: OBESITY  Julia Mccoy is here to discuss her progress with her obesity treatment plan. She is on the following a lower carbohydrate, vegetable and lean protein rich diet plan and states she is following her eating plan approximately 70 % of the time. She states she is exercising 30 minutes 3 days per week.   Interval History:  Since last office visit she has maintained her weight. She is trying to manage my eating.  She is struggling with polyphagia and cravings.   She works from home 2 days per week and goes into the office 3 days per week. She is drinking water and tea (half and half).  She is going to the gym 3 days per week (increased days since last visit)-cardio 20 minutes, stretching and resistance training.   Her highest was 360lbs.    Pharmacotherapy for weight loss: She is not currently taking a medication for medical weight loss.    Previous pharmacotherapy for medical weight loss:  She has tried Topamax in the past for cravings and doesn't remember why she stopped taking it.    Bariatric surgery:  Patient has not had bariatric surgery   Hypertension Hypertension BP looks better today.  Medication(s): Hyzaar  50-12.5mg .  denies side effects.  Denies chest pain, palpitations and SOB.  BP Readings from Last 3 Encounters:  04/30/24 138/83  04/03/24 (!) 146/82  11/13/23 132/87   Lab Results  Component Value Date   CREATININE 0.69 07/26/2023   CREATININE 0.78 01/15/2023   CREATININE 0.78 06/07/2022       PHYSICAL EXAM:  Blood pressure 138/83, pulse 76, temperature 97.8 F (36.6 C), height 5' 10 (1.778 m), weight (!) 347 lb (157.4 kg), last menstrual period 04/03/2024, SpO2 98%. Body mass index is 49.79 kg/m.  General: She is overweight, cooperative, alert, well developed, and in no acute distress. PSYCH: Has normal mood, affect and thought process.   Extremities: No edema.  Neurologic: No gross sensory or motor deficits. No tremors or fasciculations noted.    DIAGNOSTIC DATA REVIEWED:  BMET    Component Value Date/Time   NA 140 07/26/2023 1209   K 3.9 07/26/2023 1209   CL 101 07/26/2023 1209   CO2 23 07/26/2023 1209   GLUCOSE 92 07/26/2023 1209   GLUCOSE 88 11/21/2021 2010   BUN 10 07/26/2023 1209   CREATININE 0.69 07/26/2023 1209   CALCIUM 9.1 07/26/2023 1209   GFRNONAA >60 11/21/2021 2010   GFRAA >60 02/03/2020 1853   Lab Results  Component Value Date   HGBA1C 5.5 01/15/2023   HGBA1C 5.6 05/03/2021   Lab Results  Component Value Date   INSULIN  15.0 01/15/2023   INSULIN   22.4 05/03/2021   Lab Results  Component Value Date   TSH 2.460 07/26/2023   CBC    Component Value Date/Time   WBC 4.5 01/15/2023 1156   WBC 4.9 11/21/2021 2010   RBC 4.40 01/15/2023 1156   RBC 4.50 11/21/2021 2010   HGB 12.1 01/15/2023 1156   HCT 39.5 01/15/2023 1156   PLT 178 01/15/2023 1156   MCV 90 01/15/2023 1156   MCH 27.5 01/15/2023 1156   MCH 27.8 11/21/2021 2010   MCHC 30.6 (L) 01/15/2023 1156   MCHC 32.1 11/21/2021 2010   RDW 13.1 01/15/2023 1156   Iron Studies    Component Value Date/Time   FERRITIN 120 01/15/2023 1156   Lipid Panel     Component Value Date/Time    CHOL 168 07/26/2023 1209   TRIG 72 07/26/2023 1209   HDL 45 07/26/2023 1209   LDLCALC 109 (H) 07/26/2023 1209   Hepatic Function Panel     Component Value Date/Time   PROT 7.8 07/26/2023 1209   ALBUMIN 4.2 07/26/2023 1209   AST 16 07/26/2023 1209   ALT 14 07/26/2023 1209   ALKPHOS 65 07/26/2023 1209   BILITOT 0.5 07/26/2023 1209      Component Value Date/Time   TSH 2.460 07/26/2023 1209   Nutritional Lab Results  Component Value Date   VD25OH 43.2 07/26/2023   VD25OH 29.8 (L) 01/15/2023   VD25OH 24.2 (L) 06/07/2022     ASSESSMENT AND PLAN  TREATMENT PLAN FOR OBESITY:  Recommended Dietary Goals  Jazmene is currently in the action stage of change. As such, her goal is to continue weight management plan. She has agreed to keeping a food journal and adhering to recommended goals of 1600-1700 calories and 100+ grams protein.  Mynetdiary app  Behavioral Intervention  We discussed the following Behavioral Modification Strategies today: increasing lean protein intake to established goals, decreasing simple carbohydrates , increasing vegetables, increasing fiber rich foods, increasing water intake , work on meal planning and preparation, work on tracking and journaling calories using tracking application, and continue to work on maintaining a reduced calorie state, getting the recommended amount of protein, incorporating whole foods, making healthy choices, staying well hydrated and practicing mindfulness when eating..  Additional resources provided today:  fruit choices  Recommended Physical Activity Goals  Maryagnes has been advised to work up to 150 minutes of moderate intensity aerobic activity a week and strengthening exercises 2-3 times per week for cardiovascular health, weight loss maintenance and preservation of muscle mass.   She has agreed to Think about enjoyable ways to increase daily physical activity and overcoming barriers to exercise, Increase physical activity in  their day and reduce sedentary time (increase NEAT)., and continue to gradually increase the amount and intensity of exercise routine  Recommended [employer] app  Pharmacotherapy We discussed various medication options to help Caidence with her weight loss efforts and we both agreed to consider her options.  She never started Wegovy .  Would like to start in the future.  Will see GI first. .  ASSOCIATED CONDITIONS ADDRESSED TODAY  Action/Plan  Essential hypertension Continue to follow up with PCP.  Continue meds as directed  Class 3 severe obesity due to excess calories with body mass index (BMI) of 45.0 to 49.9 in adult      Goals Consider Wegovy  after seeing GI Increase water intake Decrease sugary drinks   Will obtain labs in 1-2 months  Return in about 4 weeks (around 05/28/2024).SABRA She was informed of the  importance of frequent follow up visits to maximize her success with intensive lifestyle modifications for her multiple health conditions.   ATTESTASTION STATEMENTS:  Reviewed by clinician on day of visit: allergies, medications, problem list, medical history, surgical history, family history, social history, and previous encounter notes.   Time spent on visit including pre-visit chart review and post-visit care and charting was 30 minutes.    Corean SAUNDERS. Emely Fahy FNP-C

## 2024-05-03 DIAGNOSIS — G4733 Obstructive sleep apnea (adult) (pediatric): Secondary | ICD-10-CM | POA: Diagnosis not present

## 2024-05-28 DIAGNOSIS — Z83719 Family history of colon polyps, unspecified: Secondary | ICD-10-CM | POA: Diagnosis not present

## 2024-05-28 DIAGNOSIS — K581 Irritable bowel syndrome with constipation: Secondary | ICD-10-CM | POA: Diagnosis not present

## 2024-06-04 ENCOUNTER — Encounter: Payer: Self-pay | Admitting: Nurse Practitioner

## 2024-06-04 ENCOUNTER — Ambulatory Visit: Admitting: Nurse Practitioner

## 2024-06-04 VITALS — BP 138/81 | HR 77 | Temp 97.7°F | Ht 70.0 in | Wt 342.0 lb

## 2024-06-04 DIAGNOSIS — I10 Essential (primary) hypertension: Secondary | ICD-10-CM

## 2024-06-04 DIAGNOSIS — R5383 Other fatigue: Secondary | ICD-10-CM | POA: Diagnosis not present

## 2024-06-04 DIAGNOSIS — E038 Other specified hypothyroidism: Secondary | ICD-10-CM

## 2024-06-04 DIAGNOSIS — E559 Vitamin D deficiency, unspecified: Secondary | ICD-10-CM | POA: Diagnosis not present

## 2024-06-04 DIAGNOSIS — Z79899 Other long term (current) drug therapy: Secondary | ICD-10-CM | POA: Diagnosis not present

## 2024-06-04 DIAGNOSIS — Z87898 Personal history of other specified conditions: Secondary | ICD-10-CM

## 2024-06-04 DIAGNOSIS — E66813 Obesity, class 3: Secondary | ICD-10-CM

## 2024-06-04 DIAGNOSIS — Z6841 Body Mass Index (BMI) 40.0 and over, adult: Secondary | ICD-10-CM

## 2024-06-04 DIAGNOSIS — E7849 Other hyperlipidemia: Secondary | ICD-10-CM | POA: Diagnosis not present

## 2024-06-04 NOTE — Progress Notes (Signed)
 Office: [phone]  /  Fax: [phone]  WEIGHT SUMMARY AND BIOMETRICS  Weight Lost Since Last Visit: 5lb  Weight Gained Since Last Visit: 0lb   Vitals Temp: 97.7 F (36.5 C) BP: 138/81 Pulse Rate: 77 SpO2: 99 %   Anthropometric Measurements Height: 5' 10 (1.778 m) Weight: (!) 342 lb (155.1 kg) BMI (Calculated): 49.07 Weight at Last Visit: 347lb Weight Lost Since Last Visit: 5lb Weight Gained Since Last Visit: 0lb Starting Weight: 340lb Total Weight Loss (lbs): 0 lb (0 kg)   Body Composition  Body Fat %: 53.1 % Fat Mass (lbs): 182 lbs Muscle Mass (lbs): 152.8 lbs Total Body Water (lbs): 113.8 lbs Visceral Fat Rating : 19   Other Clinical Data Fasting: No Labs: No Today's Visit #: 27 Starting Date: 04/20/21     HPI  Chief Complaint: OBESITY  Julia Mccoy is here to discuss her progress with her obesity treatment plan. She is on the following a lower carbohydrate, vegetable and lean protein rich diet plan and states she is following her eating plan approximately 40 % of the time. She states she is exercising 30 minutes 2-3 days per week.   Interval History:  Since last office visit she has lost 5 pounds.  She saw GI last on 05/28/24.  She continues to struggle with constipation.  Has a follow up visit in November.  Was told to hold off on starting weight loss medications at this time and to follow a high fiber diet.  She has been drinking more water and is exercising 2-3 days per week.  She is walking for 20-30 minutes.    Her highest was 360lbs.    Pharmacotherapy for weight loss: She is not currently taking a medication for medical weight loss.    Previous pharmacotherapy for medical weight loss:   She has tried Topamax in the past for cravings and doesn't remember why she stopped taking it.    Bariatric surgery:  Patient has not had bariatric surgery   Hypertension Hypertension-BP looks better today.  Medication(s): Hyzaar, Norvasc.   Denies chest  pain, palpitations and SOB.  BP Readings from Last 3 Encounters:  06/04/24 138/81  04/30/24 138/83  04/03/24 (!) 146/82   Lab Results  Component Value Date   CREATININE 0.69 07/26/2023   CREATININE 0.78 01/15/2023   CREATININE 0.78 06/07/2022    Vit D deficiency  She is taking Vit D 50,000 IU weekly.  Denies side effects.  Denies nausea, vomiting or muscle weakness.    Lab Results  Component Value Date   VD25OH 43.2 07/26/2023   VD25OH 29.8 (L) 01/15/2023   VD25OH 24.2 (L) 06/07/2022     Hypothyroidism Stable.  Does not report symptoms associated with uncontrolled hypothyroidism. Medication(s): Levothyroxine 88 mcg daily.  Reports fatigue  Lab Results  Component Value Date   TSH 2.460 07/26/2023   Hyperlipidemia Medication(s): None.   Lab Results  Component Value Date   CHOL 168 07/26/2023   HDL 45 07/26/2023   LDLCALC 109 (H) 07/26/2023   TRIG 72 07/26/2023   Lab Results  Component Value Date   ALT 14 07/26/2023   AST 16 07/26/2023   ALKPHOS 65 07/26/2023   BILITOT 0.5 07/26/2023   The 10-year ASCVD risk score (Arnett DK, et al., 2019) is: 4.8%   Values used to calculate the score:     Age: 48 years     Clincally relevant sex: Female     Is Non-Hispanic African American: Yes  Diabetic: No     Tobacco smoker: No     Systolic Blood Pressure: 138 mmHg     Is BP treated: Yes     HDL Cholesterol: 45 mg/dL     Total Cholesterol: 168 mg/dL   PHYSICAL EXAM:  Blood pressure 138/81, pulse 77, temperature 97.7 F (36.5 C), height 5' 10 (1.778 m), weight (!) 342 lb (155.1 kg), last menstrual period 05/30/2024, SpO2 99%. Body mass index is 49.07 kg/m.  General: She is overweight, cooperative, alert, well developed, and in no acute distress. PSYCH: Has normal mood, affect and thought process.   Extremities: No edema.  Neurologic: No gross sensory or motor deficits. No tremors or fasciculations noted.    DIAGNOSTIC DATA REVIEWED:  BMET    Component  Value Date/Time   NA 140 07/26/2023 1209   K 3.9 07/26/2023 1209   CL 101 07/26/2023 1209   CO2 23 07/26/2023 1209   GLUCOSE 92 07/26/2023 1209   GLUCOSE 88 11/21/2021 2010   BUN 10 07/26/2023 1209   CREATININE 0.69 07/26/2023 1209   CALCIUM 9.1 07/26/2023 1209   GFRNONAA >60 11/21/2021 2010   GFRAA >60 02/03/2020 1853   Lab Results  Component Value Date   HGBA1C 5.5 01/15/2023   HGBA1C 5.6 05/03/2021   Lab Results  Component Value Date   INSULIN  15.0 01/15/2023   INSULIN  22.4 05/03/2021   Lab Results  Component Value Date   TSH 2.460 07/26/2023   CBC    Component Value Date/Time   WBC 4.5 01/15/2023 1156   WBC 4.9 11/21/2021 2010   RBC 4.40 01/15/2023 1156   RBC 4.50 11/21/2021 2010   HGB 12.1 01/15/2023 1156   HCT 39.5 01/15/2023 1156   PLT 178 01/15/2023 1156   MCV 90 01/15/2023 1156   MCH 27.5 01/15/2023 1156   MCH 27.8 11/21/2021 2010   MCHC 30.6 (L) 01/15/2023 1156   MCHC 32.1 11/21/2021 2010   RDW 13.1 01/15/2023 1156   Iron Studies    Component Value Date/Time   FERRITIN 120 01/15/2023 1156   Lipid Panel     Component Value Date/Time   CHOL 168 07/26/2023 1209   TRIG 72 07/26/2023 1209   HDL 45 07/26/2023 1209   LDLCALC 109 (H) 07/26/2023 1209   Hepatic Function Panel     Component Value Date/Time   PROT 7.8 07/26/2023 1209   ALBUMIN 4.2 07/26/2023 1209   AST 16 07/26/2023 1209   ALT 14 07/26/2023 1209   ALKPHOS 65 07/26/2023 1209   BILITOT 0.5 07/26/2023 1209      Component Value Date/Time   TSH 2.460 07/26/2023 1209   Nutritional Lab Results  Component Value Date   VD25OH 43.2 07/26/2023   VD25OH 29.8 (L) 01/15/2023   VD25OH 24.2 (L) 06/07/2022     ASSESSMENT AND PLAN  TREATMENT PLAN FOR OBESITY:  Recommended Dietary Goals  Julia Mccoy is currently in the action stage of change. As such, her goal is to continue weight management plan. She has agreed to practicing portion control and making smarter food choices, such as  increasing vegetables and decreasing simple carbohydrates.  Behavioral Intervention  We discussed the following Behavioral Modification Strategies today: increasing vegetables, increasing fiber rich foods, increasing water intake , and continue to work on maintaining a reduced calorie state, getting the recommended amount of protein, incorporating whole foods, making healthy choices, staying well hydrated and practicing mindfulness when eating..  Additional resources provided today: NA  Recommended Physical Activity Goals  Julia Mccoy  has been advised to work up to 150 minutes of moderate intensity aerobic activity a week and strengthening exercises 2-3 times per week for cardiovascular health, weight loss maintenance and preservation of muscle mass.   She has agreed to Think about enjoyable ways to increase daily physical activity and overcoming barriers to exercise, Increase physical activity in their day and reduce sedentary time (increase NEAT)., and continue to gradually increase the amount and intensity of exercise routine  Recommended [place] app  Pharmacotherapy Hold off on starting at this time per GI  ASSOCIATED CONDITIONS ADDRESSED TODAY  Action/Plan  Essential hypertension Continue to follow up with PCP.  Take meds as directed  Vitamin D  deficiency -     VITAMIN D  25 Hydroxy (Vit-D Deficiency, Fractures)  Other hyperlipidemia -     Lipid Panel With LDL/HDL Ratio  Other specified hypothyroidism -     TSH  Fatigue, unspecified type -     CBC with Differential/Platelet -     TSH -     Vitamin B12  History of prediabetes -     Hemoglobin A1c  Medication management -     Comprehensive metabolic panel with GFR  Class 3 severe obesity due to excess calories with body mass index (BMI) of 45.0 to 49.9 in adult      Continue to follow up with GI  Return in about 4 weeks (around 07/02/2024).Julia Mccoy She was informed of the importance of frequent follow up visits to maximize her  success with intensive lifestyle modifications for her multiple health conditions.   ATTESTASTION STATEMENTS:  Reviewed by clinician on day of visit: allergies, medications, problem list, medical history, surgical history, family history, social history, and previous encounter notes.     Corean SAUNDERS. Kymiah Araiza FNP-C

## 2024-06-05 LAB — COMPREHENSIVE METABOLIC PANEL WITH GFR
ALT: 16 IU/L (ref 0–32)
AST: 20 IU/L (ref 0–40)
Albumin: 4.2 g/dL (ref 3.9–4.9)
Alkaline Phosphatase: 68 IU/L (ref 44–121)
BUN/Creatinine Ratio: 15 (ref 9–23)
BUN: 12 mg/dL (ref 6–24)
Bilirubin Total: 0.4 mg/dL (ref 0.0–1.2)
CO2: 23 mmol/L (ref 20–29)
Calcium: 9.5 mg/dL (ref 8.7–10.2)
Chloride: 102 mmol/L (ref 96–106)
Creatinine, Ser: 0.78 mg/dL (ref 0.57–1.00)
Globulin, Total: 3.3 g/dL (ref 1.5–4.5)
Glucose: 86 mg/dL (ref 70–99)
Potassium: 4 mmol/L (ref 3.5–5.2)
Sodium: 137 mmol/L (ref 134–144)
Total Protein: 7.5 g/dL (ref 6.0–8.5)
eGFR: 94 mL/min/1.73 (ref >59)

## 2024-06-05 LAB — CBC WITH DIFFERENTIAL/PLATELET
Basophils Absolute: 0 x10E3/uL (ref 0.0–0.2)
Basos: 0 %
EOS (ABSOLUTE): 0.1 x10E3/uL (ref 0.0–0.4)
Eos: 3 %
Hematocrit: 39.1 % (ref 34.0–46.6)
Hemoglobin: 12 g/dL (ref 11.1–15.9)
Immature Grans (Abs): 0 x10E3/uL (ref 0.0–0.1)
Immature Granulocytes: 0 %
Lymphocytes Absolute: 2.1 x10E3/uL (ref 0.7–3.1)
Lymphs: 45 %
MCH: 27.7 pg (ref 26.6–33.0)
MCHC: 30.7 g/dL — ABNORMAL LOW (ref 31.5–35.7)
MCV: 90 fL (ref 79–97)
Monocytes Absolute: 0.5 x10E3/uL (ref 0.1–0.9)
Monocytes: 11 %
Neutrophils Absolute: 1.9 x10E3/uL (ref 1.4–7.0)
Neutrophils: 41 %
Platelets: 219 x10E3/uL (ref 150–450)
RBC: 4.33 x10E6/uL (ref 3.77–5.28)
RDW: 13.2 % (ref 11.7–15.4)
WBC: 4.6 x10E3/uL (ref 3.4–10.8)

## 2024-06-05 LAB — LIPID PANEL WITH LDL/HDL RATIO
Cholesterol, Total: 169 mg/dL (ref 100–199)
HDL: 42 mg/dL (ref >39)
LDL Chol Calc (NIH): 105 mg/dL — ABNORMAL HIGH (ref 0–99)
LDL/HDL Ratio: 2.5 ratio (ref 0.0–3.2)
Triglycerides: 121 mg/dL (ref 0–149)
VLDL Cholesterol Cal: 22 mg/dL (ref 5–40)

## 2024-06-05 LAB — VITAMIN D 25 HYDROXY (VIT D DEFICIENCY, FRACTURES): Vit D, 25-Hydroxy: 23.3 ng/mL — ABNORMAL LOW (ref 30.0–100.0)

## 2024-06-05 LAB — HEMOGLOBIN A1C
Est. average glucose Bld gHb Est-mCnc: 117 mg/dL
Hgb A1c MFr Bld: 5.7 % — ABNORMAL HIGH (ref 4.8–5.6)

## 2024-06-05 LAB — VITAMIN B12: Vitamin B-12: 520 pg/mL (ref 232–1245)

## 2024-06-05 LAB — TSH: TSH: 2.35 u[IU]/mL (ref 0.450–4.500)

## 2024-07-09 ENCOUNTER — Ambulatory Visit: Admitting: Nurse Practitioner

## 2024-07-10 ENCOUNTER — Ambulatory Visit (INDEPENDENT_AMBULATORY_CARE_PROVIDER_SITE_OTHER): Admitting: Nurse Practitioner

## 2024-07-10 ENCOUNTER — Encounter: Payer: Self-pay | Admitting: Nurse Practitioner

## 2024-07-10 VITALS — BP 116/78 | HR 80 | Temp 98.2°F | Ht 70.0 in | Wt 344.0 lb

## 2024-07-10 DIAGNOSIS — K5909 Other constipation: Secondary | ICD-10-CM

## 2024-07-10 DIAGNOSIS — R7303 Prediabetes: Secondary | ICD-10-CM | POA: Diagnosis not present

## 2024-07-10 DIAGNOSIS — E66813 Obesity, class 3: Secondary | ICD-10-CM | POA: Diagnosis not present

## 2024-07-10 DIAGNOSIS — Z6841 Body Mass Index (BMI) 40.0 and over, adult: Secondary | ICD-10-CM

## 2024-07-10 DIAGNOSIS — E559 Vitamin D deficiency, unspecified: Secondary | ICD-10-CM | POA: Diagnosis not present

## 2024-07-10 MED ORDER — VITAMIN D (ERGOCALCIFEROL) 1.25 MG (50000 UNIT) PO CAPS: 50000.0000 [IU] | ORAL_CAPSULE | ORAL | 0 refills | Status: DC

## 2024-07-10 NOTE — Progress Notes (Signed)
 Office: [phone]  /  Fax: [phone]  WEIGHT SUMMARY AND BIOMETRICS  Weight Lost Since Last Visit: 0lb  Weight Gained Since Last Visit: 2lb   Vitals Temp: 98.2 F (36.8 C) BP: 116/78 Pulse Rate: 80 SpO2: 97 %   Anthropometric Measurements Height: 5' 10 (1.778 m) Weight: (!) 344 lb (156 kg) BMI (Calculated): 49.36 Weight at Last Visit: 342lb Weight Lost Since Last Visit: 0lb Weight Gained Since Last Visit: 2lb Starting Weight: 340lb Total Weight Loss (lbs): 0 lb (0 kg)   Body Composition  Body Fat %: 49.3 % Fat Mass (lbs): 169.6 lbs Muscle Mass (lbs): 165.8 lbs Total Body Water (lbs): 118.6 lbs Visceral Fat Rating : 18   Other Clinical Data Fasting: No Labs: No Today's Visit #: 28 Starting Date: 04/20/21     HPI  Chief Complaint: OBESITY  Julia Mccoy is here to discuss her progress with her obesity treatment plan. She is on the following a lower carbohydrate, vegetable and lean protein rich diet plan and states she is following her eating plan approximately 0 % of the time. She states she is exercising 20 minutes 2-3 days per week.   Interval History:  Since last office visit she has gained 2 pounds.  She has tried to increase her fiber intake since seeing GI. She continues to struggle with constipation. She is taking miralax 3 days per week.   It was recommended to hold off on starting a weight loss medication due to her constipation. She has a follow up appt with GI 09/09/24.  She is drinking less < 48 oz and is drinking 16 oz tea daily.  She is walking on the treadmill 20 mins 2-3 days per week.    BF: yogurt or fruit or chicken or all 3 at [employer] Snack:  belvitas Lunch:  wrap with chicken salad from [employer] food or cucumbers with chicken salad with chips or sandwiches with deli meat and cheese Snack:  none Dinner:  protein with a vegetable or deviled eggs or brown rice Snack:  none  Her highest was 360lbs.    Pharmacotherapy for weight  loss: She is not currently taking a medication for medical weight loss.    Previous pharmacotherapy for medical weight loss:   She has tried Topamax in the past for cravings and doesn't remember why she stopped taking it.    Bariatric surgery:  Patient has not had bariatric surgery   Prediabetes Last A1c was 5.7  Medication(s): none Polyphagia:Yes Lab Results  Component Value Date   HGBA1C 5.7 (H) 06/04/2024   HGBA1C 5.5 01/15/2023   HGBA1C 5.5 06/07/2022   HGBA1C 5.6 05/03/2021   Lab Results  Component Value Date   INSULIN  15.0 01/15/2023   INSULIN  15.1 06/07/2022   INSULIN  22.4 05/03/2021   Vit D deficiency  She is not currently taking Vit D.  Denies side effects.  Denies nausea, vomiting or muscle weakness.    Lab Results  Component Value Date   VD25OH 23.3 (L) 06/04/2024   VD25OH 43.2 07/26/2023   VD25OH 29.8 (L) 01/15/2023     PHYSICAL EXAM:  Blood pressure 116/78, pulse 80, temperature 98.2 F (36.8 C), height 5' 10 (1.778 m), weight (!) 344 lb (156 kg), last menstrual period 06/30/2024, SpO2 97%. Body mass index is 49.36 kg/m.  General: She is overweight, cooperative, alert, well developed, and in no acute distress. PSYCH: Has normal mood, affect and thought process.   Extremities: No edema.  Neurologic: No gross sensory  or motor deficits. No tremors or fasciculations noted.    DIAGNOSTIC DATA REVIEWED:  BMET    Component Value Date/Time   NA 137 06/04/2024 1131   K 4.0 06/04/2024 1131   CL 102 06/04/2024 1131   CO2 23 06/04/2024 1131   GLUCOSE 86 06/04/2024 1131   GLUCOSE 88 11/21/2021 2010   BUN 12 06/04/2024 1131   CREATININE 0.78 06/04/2024 1131   CALCIUM 9.5 06/04/2024 1131   GFRNONAA >60 11/21/2021 2010   GFRAA >60 02/03/2020 1853   Lab Results  Component Value Date   HGBA1C 5.7 (H) 06/04/2024   HGBA1C 5.6 05/03/2021   Lab Results  Component Value Date   INSULIN  15.0 01/15/2023   INSULIN  22.4 05/03/2021   Lab Results  Component  Value Date   TSH 2.350 06/04/2024   CBC    Component Value Date/Time   WBC 4.6 06/04/2024 1131   WBC 4.9 11/21/2021 2010   RBC 4.33 06/04/2024 1131   RBC 4.50 11/21/2021 2010   HGB 12.0 06/04/2024 1131   HCT 39.1 06/04/2024 1131   PLT 219 06/04/2024 1131   MCV 90 06/04/2024 1131   MCH 27.7 06/04/2024 1131   MCH 27.8 11/21/2021 2010   MCHC 30.7 (L) 06/04/2024 1131   MCHC 32.1 11/21/2021 2010   RDW 13.2 06/04/2024 1131   Iron Studies    Component Value Date/Time   FERRITIN 120 01/15/2023 1156   Lipid Panel     Component Value Date/Time   CHOL 169 06/04/2024 1131   TRIG 121 06/04/2024 1131   HDL 42 06/04/2024 1131   LDLCALC 105 (H) 06/04/2024 1131   Hepatic Function Panel     Component Value Date/Time   PROT 7.5 06/04/2024 1131   ALBUMIN 4.2 06/04/2024 1131   AST 20 06/04/2024 1131   ALT 16 06/04/2024 1131   ALKPHOS 68 06/04/2024 1131   BILITOT 0.4 06/04/2024 1131      Component Value Date/Time   TSH 2.350 06/04/2024 1131   Nutritional Lab Results  Component Value Date   VD25OH 23.3 (L) 06/04/2024   VD25OH 43.2 07/26/2023   VD25OH 29.8 (L) 01/15/2023     ASSESSMENT AND PLAN  TREATMENT PLAN FOR OBESITY:  Recommended Dietary Goals  Julia Mccoy is currently in the action stage of change. As such, her goal is to continue weight management plan. She has agreed to practicing portion control and making smarter food choices, such as increasing vegetables and decreasing simple carbohydrates.  Behavioral Intervention  We discussed the following Behavioral Modification Strategies today: decreasing simple carbohydrates , increasing vegetables, increasing fiber rich foods, increasing water intake , continue to work on maintaining a reduced calorie state, getting the recommended amount of protein, incorporating whole foods, making healthy choices, staying well hydrated and practicing mindfulness when eating., and increase protein intake, fibrous foods (25 grams per day  for women, 30 grams for men) and water to improve satiety and decrease hunger signals. .  Additional resources provided today: NA  Recommended Physical Activity Goals  Julia Mccoy has been advised to work up to 150 minutes of moderate intensity aerobic activity a week and strengthening exercises 2-3 times per week for cardiovascular health, weight loss maintenance and preservation of muscle mass.   She has agreed to Think about enjoyable ways to increase daily physical activity and overcoming barriers to exercise, Increase physical activity in their day and reduce sedentary time (increase NEAT)., Start strengthening exercises with a goal of 2-3 sessions a week , Continue to gradually increase  the amount and intensity of exercise routine, Increase volume of physical activity to a goal of 240 minutes a week, and Combine aerobic and strengthening exercises for efficiency and improved cardiometabolic health.    ASSOCIATED CONDITIONS ADDRESSED TODAY  Action/Plan  Chronic constipation Continue follow-up with GI.  Prediabetes Information given on insulin  resistance, prediabetes and metformin.  Vitamin D  deficiency -     Vitamin D  (Ergocalciferol ); Take 1 capsule (50,000 Units total) by mouth every 7 (seven) days.  Dispense: 4 capsule; Refill: 0  Obesity, Class III, BMI 40-49.9 (morbid obesity)      Handouts given:  high fiber foods, prediabetes/IR, Metformin and protein content of foods.     Return in about 4 weeks (around 08/07/2024).Julia Mccoy She was informed of the importance of frequent follow up visits to maximize her success with intensive lifestyle modifications for her multiple health conditions.   ATTESTASTION STATEMENTS:  Reviewed by clinician on day of visit: allergies, medications, problem list, medical history, surgical history, family history, social history, and previous encounter notes.     Julia Mccoy. Ameah Chanda FNP-C

## 2024-07-14 ENCOUNTER — Ambulatory Visit: Admitting: Nurse Practitioner

## 2024-07-16 ENCOUNTER — Emergency Department (HOSPITAL_BASED_OUTPATIENT_CLINIC_OR_DEPARTMENT_OTHER)

## 2024-07-16 ENCOUNTER — Emergency Department (HOSPITAL_BASED_OUTPATIENT_CLINIC_OR_DEPARTMENT_OTHER): Admission: EM | Admit: 2024-07-16 | Discharge: 2024-07-16 | Disposition: A | Discharge status: Home / Self Care

## 2024-07-16 ENCOUNTER — Other Ambulatory Visit: Payer: Self-pay

## 2024-07-16 ENCOUNTER — Encounter (HOSPITAL_BASED_OUTPATIENT_CLINIC_OR_DEPARTMENT_OTHER): Payer: Self-pay

## 2024-07-16 DIAGNOSIS — R1011 Right upper quadrant pain: Secondary | ICD-10-CM | POA: Insufficient documentation

## 2024-07-16 DIAGNOSIS — K5900 Constipation, unspecified: Secondary | ICD-10-CM | POA: Insufficient documentation

## 2024-07-16 DIAGNOSIS — R109 Unspecified abdominal pain: Secondary | ICD-10-CM

## 2024-07-16 LAB — CBC
HCT: 37.9 % (ref 36.0–46.0)
Hemoglobin: 12.1 g/dL (ref 12.0–15.0)
MCH: 27.3 pg (ref 26.0–34.0)
MCHC: 31.9 g/dL (ref 30.0–36.0)
MCV: 85.6 fL (ref 80.0–100.0)
Platelets: 226 K/uL (ref 150–400)
RBC: 4.43 MIL/uL (ref 3.87–5.11)
RDW: 14.6 % (ref 11.5–15.5)
WBC: 5.7 K/uL (ref 4.0–10.5)
nRBC: 0 % (ref 0.0–0.2)

## 2024-07-16 LAB — URINALYSIS, ROUTINE W REFLEX MICROSCOPIC
Bilirubin Urine: NEGATIVE
Glucose, UA: NEGATIVE mg/dL
Hgb urine dipstick: NEGATIVE
Ketones, ur: NEGATIVE mg/dL
Leukocytes,Ua: NEGATIVE
Nitrite: NEGATIVE
Protein, ur: NEGATIVE mg/dL
Specific Gravity, Urine: 1.02 (ref 1.005–1.030)
pH: 7 (ref 5.0–8.0)

## 2024-07-16 LAB — COMPREHENSIVE METABOLIC PANEL WITH GFR
ALT: 15 U/L (ref 0–44)
AST: 23 U/L (ref 15–41)
Albumin: 4.2 g/dL (ref 3.5–5.0)
Alkaline Phosphatase: 63 U/L (ref 38–126)
Anion gap: 11 (ref 5–15)
BUN: 11 mg/dL (ref 6–20)
CO2: 24 mmol/L (ref 22–32)
Calcium: 9.1 mg/dL (ref 8.9–10.3)
Chloride: 103 mmol/L (ref 98–111)
Creatinine, Ser: 0.69 mg/dL (ref 0.44–1.00)
GFR, Estimated: >60 mL/min (ref >60)
Glucose, Bld: 93 mg/dL (ref 70–99)
Potassium: 3.6 mmol/L (ref 3.5–5.1)
Sodium: 139 mmol/L (ref 135–145)
Total Bilirubin: 0.3 mg/dL (ref 0.0–1.2)
Total Protein: 8.1 g/dL (ref 6.5–8.1)

## 2024-07-16 LAB — LIPASE, BLOOD: Lipase: 27 U/L (ref 11–51)

## 2024-07-16 LAB — PREGNANCY, URINE: Preg Test, Ur: NEGATIVE

## 2024-07-16 MED ORDER — SIMETHICONE 80 MG PO TABS: 80.0000 mg | ORAL_TABLET | Freq: Every day | ORAL | 0 refills | Status: AC

## 2024-07-16 MED ORDER — ALUM & MAG HYDROXIDE-SIMETH 200-200-20 MG/5ML PO SUSP
30.0000 mL | Freq: Once | ORAL | Status: AC
  Administered 2024-07-16: 30 mL via ORAL
  Filled 2024-07-16: qty 30

## 2024-07-16 MED ORDER — PANTOPRAZOLE SODIUM 40 MG PO TBEC
40.0000 mg | DELAYED_RELEASE_TABLET | Freq: Once | ORAL | Status: AC
  Administered 2024-07-16: 40 mg via ORAL
  Filled 2024-07-16: qty 1

## 2024-07-16 MED ORDER — PANTOPRAZOLE SODIUM 20 MG PO TBEC: 40.0000 mg | DELAYED_RELEASE_TABLET | Freq: Every day | ORAL | 0 refills | Status: AC

## 2024-07-16 MED ORDER — SENNA 8.6 MG PO TABS
1.0000 | ORAL_TABLET | Freq: Every day | ORAL | 0 refills | Status: AC | PRN
Start: 2024-07-16 — End: 2024-08-15

## 2024-07-16 NOTE — ED Triage Notes (Signed)
 Pt reports stomach pain, gas, constipation that has been going on for a couple of weeks. States that she got worse after eating [employer] for breakfast today.

## 2024-07-16 NOTE — Discharge Instructions (Addendum)
 Follow-up with your primary care doctor and specialist in 1 to 2 weeks.  You can continue your medications as prescribed but start using senna daily as needed for constipation and you can also use simethicone  as needed for constipation.  Take Protonix  daily.

## 2024-07-16 NOTE — ED Provider Notes (Signed)
 [place] [facility] AT [facility] Provider Note   CSN: [id number] Arrival date & time: 07/16/24  1225     Patient presents with: Abdominal Pain   Julia Mccoy is a 48 y.o. female.   48 year old female presents for evaluation of abdominal pain and constipation as well as gas and bloating.  States has been going for few weeks but got worse today after she had [employer].  States it does get worse after she eats heavy meals.  States pain is mostly in the right upper quadrant but also generalized.  States she has small bowel movements daily but usually has to take MiraLAX.  States she has been more constipated this week and has taken Dulcolax as well.  Denies any other symptoms or concerns.   Abdominal Pain Associated symptoms: constipation   Associated symptoms: no chest pain, no chills, no cough, no dysuria, no fever, no hematuria, no shortness of breath, no sore throat and no vomiting        Prior to Admission medications   Medication Sig Start Date End Date Taking? Authorizing Provider  pantoprazole  (PROTONIX ) 20 MG tablet Take 2 tablets (40 mg total) by mouth daily. 07/16/24 08/15/24 Yes Lonzie Simmer L, DO  senna (SENOKOT) 8.6 MG TABS tablet Take 1 tablet (8.6 mg total) by mouth daily as needed for mild constipation. 07/16/24 08/15/24 Yes Loris Winrow L, DO  Simethicone  80 MG TABS Take 1 tablet (80 mg total) by mouth daily. 07/16/24 08/15/24 Yes Keryl Gholson L, DO  acetaZOLAMIDE  ER (DIAMOX ) 500 MG capsule Take 1 capsule (500 mg total) by mouth 2 (two) times daily. 06/28/22 07/10/24  Camara, Amadou, MD  amLODipine (NORVASC) 10 MG tablet Take 10 mg by mouth daily.    [provider]  busPIRone (BUSPAR) 30 MG tablet Take 30 mg by mouth 2 (two) times daily. 08/04/22   [provider]  levothyroxine (SYNTHROID, LEVOTHROID) 88 MCG tablet Take 88 mcg by mouth daily.    [provider]  losartan-hydrochlorothiazide (HYZAAR) 50-12.5  MG per tablet Take 1 tablet by mouth daily.    [provider]  senna-docusate (SENOKOT-S) 8.6-50 MG tablet Take 1 tablet by mouth daily. 05/07/21   Bero, Michael M, MD  Vitamin D , Ergocalciferol , (DRISDOL ) 1.25 MG (50000 UNIT) CAPS capsule Take 1 capsule (50,000 Units total) by mouth every 7 (seven) days. 07/10/24   Becki Krabbe, FNP    Allergies: Patient has no known allergies.    Review of Systems  Constitutional:  Negative for chills and fever.  HENT:  Negative for ear pain and sore throat.   Eyes:  Negative for pain and visual disturbance.  Respiratory:  Negative for cough and shortness of breath.   Cardiovascular:  Negative for chest pain and palpitations.  Gastrointestinal:  Positive for abdominal pain and constipation. Negative for vomiting.  Genitourinary:  Negative for dysuria and hematuria.  Musculoskeletal:  Negative for arthralgias and back pain.  Skin:  Negative for color change and rash.  Neurological:  Negative for seizures and syncope.  All other systems reviewed and are negative.   Updated Vital Signs BP 127/66   Pulse 73   Temp 97.8 F (36.6 C) (Oral)   Resp 18   Ht 5' 11 (1.803 m)   Wt (!) 156 kg   LMP 06/30/2024 (Exact Date)   SpO2 98%   BMI 47.98 kg/m   Physical Exam Vitals and nursing note reviewed.  Constitutional:      General: She is not  in acute distress.    Appearance: She is well-developed. She is not ill-appearing.  HENT:     Head: Normocephalic and atraumatic.  Eyes:     Conjunctiva/sclera: Conjunctivae normal.  Cardiovascular:     Rate and Rhythm: Normal rate and regular rhythm.     Heart sounds: No murmur heard. Pulmonary:     Effort: Pulmonary effort is normal. No respiratory distress.     Breath sounds: Normal breath sounds.  Abdominal:     Palpations: Abdomen is soft.     Tenderness: There is abdominal tenderness in the right upper quadrant.  Musculoskeletal:        General: No swelling.     Cervical back: Neck  supple.  Skin:    General: Skin is warm and dry.     Capillary Refill: Capillary refill takes less than 2 seconds.  Neurological:     Mental Status: She is alert.  Psychiatric:        Mood and Affect: Mood normal.     (all labs ordered are listed, but only abnormal results are displayed) Labs Reviewed  LIPASE, BLOOD  COMPREHENSIVE METABOLIC PANEL WITH GFR  CBC  URINALYSIS, ROUTINE W REFLEX MICROSCOPIC  PREGNANCY, URINE    EKG: None  Radiology: US  Abdomen Limited RUQ (LIVER/GB) Result Date: 07/16/2024 CLINICAL DATA:  Right upper quadrant abdominal pain. EXAM: ULTRASOUND ABDOMEN LIMITED RIGHT UPPER QUADRANT COMPARISON:  None Available. FINDINGS: Gallbladder: No gallstones or wall thickening visualized. No sonographic Murphy sign noted by sonographer. Common bile duct: Diameter: 2 mm which is within normal limits. Liver: No focal lesion identified. Within normal limits in parenchymal echogenicity. Portal vein is patent on color Doppler imaging with normal direction of blood flow towards the liver. Other: None. IMPRESSION: No definite abnormality seen in the right upper quadrant of the abdomen. Electronically Signed   By: Lynwood Landy Raddle M.D.   On: 07/16/2024 14:20     Procedures   Medications Ordered in the ED  alum & mag hydroxide-simeth (MAALOX/MYLANTA) 200-200-20 MG/5ML suspension 30 mL (30 mLs Oral Given 07/16/24 1348)  pantoprazole  (PROTONIX ) EC tablet 40 mg (40 mg Oral Given 07/16/24 1348)                                    Medical Decision Making Patient here for evaluation of abdominal pain and bloating that has been going on for a long time.  Seem to be worse after eating [employer] today.  I gave her a GI cocktail Protonix  she is feeling much better.  Lab workup reviewed by me and unremarkable and ultrasound of the gallbladder is negative.  I will give prescription for simethicone  as well as add senna to her bowel regimen.  Also advised taking tonics daily and I will  start her on this.  Advised to stop with her digestive specialist care doctor.  She is feeling much better on reevaluation is stable vitals.  Advised return to the ER for new or worsening symptoms.  She feels comfortable plan be discharged home.  Problems Addressed: Abdominal pain, non-surgical: acute illness or injury Constipation, unspecified constipation type: chronic illness or injury with exacerbation, progression, or side effects of treatment  Amount and/or Complexity of Data Reviewed External Data Reviewed: notes.    Details: Prior outpatient records reviewed and patient follows up in the office for chronic constipation Labs: ordered. Decision-making details documented in ED Course.    Details: Ordered  and reviewed and unremarkable Radiology: ordered and independent interpretation performed. Decision-making details documented in ED Course.    Details: Ordered and reviewed by me Right upper quadrant ultrasound: Shows no acute abnormality  Risk OTC drugs. Prescription drug management.     Final diagnoses:  Constipation, unspecified constipation type  Abdominal pain, non-surgical    ED Discharge Orders          Ordered    pantoprazole  (PROTONIX ) 20 MG tablet  Daily        07/16/24 1459    senna (SENOKOT) 8.6 MG TABS tablet  Daily PRN        07/16/24 1459    Simethicone  80 MG TABS  Daily        07/16/24 1459               Nanea Jared L, DO 07/16/24 1520

## 2024-07-21 ENCOUNTER — Ambulatory Visit: Admitting: Neurology

## 2024-07-22 ENCOUNTER — Encounter: Payer: Self-pay | Admitting: Neurology

## 2024-07-22 ENCOUNTER — Telehealth: Payer: Self-pay | Admitting: Neurology

## 2024-07-22 ENCOUNTER — Ambulatory Visit: Admitting: Neurology

## 2024-07-22 VITALS — BP 127/69 | HR 72 | Ht 71.0 in | Wt 347.4 lb

## 2024-07-22 DIAGNOSIS — G932 Benign intracranial hypertension: Secondary | ICD-10-CM | POA: Diagnosis not present

## 2024-07-22 DIAGNOSIS — R519 Headache, unspecified: Secondary | ICD-10-CM | POA: Diagnosis not present

## 2024-07-22 MED ORDER — ACETAZOLAMIDE ER 500 MG PO CP12: 500.0000 mg | ORAL_CAPSULE | Freq: Every day | ORAL | 1 refills | Status: AC

## 2024-07-22 NOTE — Progress Notes (Addendum)
 [facility]  PATIENT: Julia Mccoy DOB: 1976-08-16  REFERRING CLINICIAN: Valma Lannie LABOR, PA-C HISTORY FROM: Patient  REASON FOR VISIT: Headaches   HISTORICAL  CHIEF COMPLAINT:  Chief Complaint  Patient presents with   Follow-up    Pt in room 15. Alone. Here for IIH follow up.   Update 07/22/24 SS: Here today to seen for headache. No headaches this week. 1 month ago, had return of headache, not sure if similar to IIH? Pain to different spots at each time. No migraine features, no changes to vision. Went to see eye doctor, [facility], was not told of any papilledema 1 month ago. Changed reading glasses. With this headache, triggered by how she slept. Mentions brain fog, forgetfulness. 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 Compliant with CPAP. Her weight is up 20 lbs since last year, constipation is issue, worries about GLP making worse.   INTERVAL HISTORY 02/15/2023:  Patient presents today for follow-up, she is alone.  She is doing well.  Last visit was in November and since then she has been doing very well no headaches.  Actually she self discontinued Diamox  and has not had any headaches.  Currently no complaints or concerns. She is compliant with her CPAP machine.   INTERVAL HISTORY 08/17/2022:  Patient presents today for follow-up, last visit was in May, at that time we discussed compliance with medication and CPAP and also goal of weight loss.  She reported that she is still not adherent to the Diamox  because she does have paresthesia in the bilateral hands.  She does use her CPAP consistently but reports some difficulty with weight loss.  In terms of the headaches she reports the headaches are stable, she knows when she is consistent with the Diamox  the headaches will stay at bay.  No additional concerns.   INTERVAL HISTORY 02/14/2022:  Patient presents today for follow-up, last visit was in November, at that time plan was to start Diamox  500 mg twice  daily.  Patient has been taking the medication inconsistently.  Once taking the medication her headaches will resolve, she will stop the medication and developed headaches later, then restart the medication and after 5 to 7-days headache will resolve.  She did report following with an eye doctor and was told that everything was normal there was no evidence of pressure behind her eyes.  She does also report using her CPAP machine inconsistently.  She is working towards losing weight but she has been challenging for her.     HISTORY OF PRESENT ILLNESS:  This is a 48 year old woman with past medical history of hypertension, hypothyroidism, sleep apnea on CPAP< anxiety and obesity who is presenting for headache.  Patient stated headache started last year on April, she has pressure type headache on the left side of her forehead, sometimes behind eyes, and sometimes on the right side.  She presented to the ED in April 2021, had a CTA head and neck which was normal and her CT head showed evidence of empty sella.  She was not started on any medication but was told to follow-up with her primary care doctor.  Patient followed-up with her primary care, was started on Effexor but did not take the medication due to concern of side effect.  She still complains of occasional headache lasting 30 minutes to 1 hour, she takes acetaminophen which seemed to resolve the headache.  Last episode was 2 weeks ago.  There are no associated nausea, no vomiting, no migrainous  features.  She had a family history of migraine with her mother.     Headache History and Characteristics: Onset: April 2021, went to the ED  Location: All over  Quality: Pressure, aching   Intensity:7 /10.  Duration: last for about 1 hour Migrainous Features: No Photophobia, no phonophobia, no nausea/vomiting.  Aura: No  History of brain injury or tumor: No  Family history: Mother with migraines  Motion sickness: no Cardiac history: no  OTC:  tylenol Sleep: Sleep is pretty good, will get about 6 hrs, has CPAP Mood/ Stress: ok  Prior prophylaxis: Propranolol: No  Verapamil:No TCA: No Topamax: No Depakote: No Effexor: No Cymbalta: No Neurontin:No  Prior abortives: Triptan: No Anti-emetic: No Steroids: No Ergotamine suppository: No  OTHER MEDICAL CONDITIONS: HTN, Hypothyroidism, Sleep apnea, Anxiety    REVIEW OF SYSTEMS: Full 14 system review of systems performed and negative with exception of: as noted in the HPI   ALLERGIES: No Known Allergies  HOME MEDICATIONS: Outpatient Medications Prior to Visit  Medication Sig Dispense Refill   amLODipine (NORVASC) 10 MG tablet Take 10 mg by mouth daily.     busPIRone (BUSPAR) 30 MG tablet Take 30 mg by mouth 2 (two) times daily.     levothyroxine (SYNTHROID, LEVOTHROID) 88 MCG tablet Take 88 mcg by mouth daily.     losartan-hydrochlorothiazide (HYZAAR) 50-12.5 MG per tablet Take 1 tablet by mouth daily.     pantoprazole  (PROTONIX ) 20 MG tablet Take 2 tablets (40 mg total) by mouth daily. 60 tablet 0   Vitamin D , Ergocalciferol , (DRISDOL ) 1.25 MG (50000 UNIT) CAPS capsule Take 1 capsule (50,000 Units total) by mouth every 7 (seven) days. 4 capsule 0   senna (SENOKOT) 8.6 MG TABS tablet Take 1 tablet (8.6 mg total) by mouth daily as needed for mild constipation. (Patient not taking: Reported on 07/22/2024) 30 tablet 0   senna-docusate (SENOKOT-S) 8.6-50 MG tablet Take 1 tablet by mouth daily. (Patient not taking: Reported on 07/22/2024) 30 tablet 0   Simethicone  80 MG TABS Take 1 tablet (80 mg total) by mouth daily. (Patient not taking: Reported on 07/22/2024) 30 tablet 0   acetaZOLAMIDE  ER (DIAMOX ) 500 MG capsule Take 1 capsule (500 mg total) by mouth 2 (two) times daily. (Patient not taking: Reported on 07/22/2024) 180 capsule 1   No facility-administered medications prior to visit.    PAST MEDICAL HISTORY: Past Medical History:  Diagnosis Date   Anxiety    Back pain     Chest pain    Constipation    Depression    Edema of both lower extremities    GERD (gastroesophageal reflux disease)    Hypertension    Hypothyroidism    Joint pain    Obesity    Palpitations    Pre-diabetes    Sleep apnea     PAST SURGICAL HISTORY: Past Surgical History:  Procedure Laterality Date   KELOID EXCISION      FAMILY HISTORY: Family History  Problem Relation Age of Onset   Hypertension Mother    Hyperlipidemia Mother    Depression Mother    Hypertension Father    Hyperlipidemia Father    Diabetes Father    Cancer Father    Heart attack Cousin     SOCIAL HISTORY: Social History   Socioeconomic History   Marital status: Single    Spouse name: Not on file   Number of children: 2   Years of education: Not on file   Highest education level: Not  on file  Occupational History   Occupation: Therapist Mental Health  Tobacco Use   Smoking status: Never   Smokeless tobacco: Never  Vaping Use   Vaping status: Never Used  Substance and Sexual Activity   Alcohol use: Yes    Comment: occ   Drug use: No   Sexual activity: Not Currently    Birth control/protection: None  Other Topics Concern   Not on file  Social History Narrative   Not on file   Social Drivers of Health   Financial Resource Strain: Low Risk  (02/15/2024)   Received from [facility]   Overall Financial Resource Strain (CARDIA)    Difficulty of Paying Living Expenses: Not hard at all  Food Insecurity: No Food Insecurity (02/15/2024)   [facility]   Hunger Vital Sign    Within the past 12 months, you worried that your food would run out before you got the money to buy more.: Never true    Within the past 12 months, the food you bought just didn't last and you didn't have money to get more.: Never true  Transportation Needs: No Transportation Needs (02/15/2024)   Received from [facility] - Transportation    Lack of Transportation (Medical): No    Lack of  Transportation (Non-Medical): No  Physical Activity: Inactive (08/18/2022)   Received from [facility]   Exercise Vital Sign    On average, how many days per week do you engage in moderate to strenuous exercise (like a brisk walk)?: 0 days    On average, how many minutes do you engage in exercise at this level?: 10 min  Stress: No Stress Concern Present (08/18/2022)   Received from [facility] Occupational [facility] - Occupational Stress Questionnaire    Feeling of Stress : Only a little  Social Connections: Unknown (08/19/2023)   Received from [facility]   Social Network    Social Network: Not on file  Intimate Partner Violence: Not At Risk (08/17/2023)   Received from [facility]   HITS    Over the last 12 months how often did your partner physically hurt you?: Never    Over the last 12 months how often did your partner insult you or talk down to you?: Never    Over the last 12 months how often did your partner threaten you with physical harm?: Never    Over the last 12 months how often did your partner scream or curse at you?: Never    PHYSICAL EXAM  GENERAL EXAM/CONSTITUTIONAL: Vitals:  Vitals:   07/22/24 1408  BP: 127/69  Pulse: 72  Weight: (!) 347 lb 6.4 oz (157.6 kg)  Height: 5' 11 (1.803 m)   Body mass index is 48.45 kg/m. Wt Readings from Last 3 Encounters:  07/22/24 (!) 347 lb 6.4 oz (157.6 kg)  07/16/24 (!) 344 lb (156 kg)  07/10/24 (!) 344 lb (156 kg)   Physical Exam  General: The patient is alert and cooperative at the time of the examination.  Skin: No significant peripheral edema is noted.  Neurologic Exam  Mental status: The patient is alert and oriented x 3 at the time of the examination. The patient has apparent normal recent and remote memory, with an apparently normal attention span and concentration ability.  Cranial nerves: Facial symmetry is present. Speech is normal, no aphasia or dysarthria is noted. Extraocular  movements are full. Visual fields are full.  Motor: The  patient has good strength in all 4 extremities.  Sensory examination: Soft touch sensation is symmetric on the face, arms, and legs.  Coordination: The patient has good finger-nose-finger and heel-to-shin bilaterally.  Gait and station: The patient has a normal gait.  Reflexes: Deep tendon reflexes are symmetric.  DIAGNOSTIC DATA (LABS, IMAGING, TESTING) - I reviewed patient records, labs, notes, testing and imaging myself where available.  Lab Results  Component Value Date   WBC 5.7 07/16/2024   HGB 12.1 07/16/2024   HCT 37.9 07/16/2024   MCV 85.6 07/16/2024   PLT 226 07/16/2024      Component Value Date/Time   NA 139 07/16/2024 1232   NA 137 06/04/2024 1131   K 3.6 07/16/2024 1232   CL 103 07/16/2024 1232   CO2 24 07/16/2024 1232   GLUCOSE 93 07/16/2024 1232   BUN 11 07/16/2024 1232   BUN 12 06/04/2024 1131   CREATININE 0.69 07/16/2024 1232   CALCIUM 9.1 07/16/2024 1232   PROT 8.1 07/16/2024 1232   PROT 7.5 06/04/2024 1131   ALBUMIN 4.2 07/16/2024 1232   ALBUMIN 4.2 06/04/2024 1131   AST 23 07/16/2024 1232   ALT 15 07/16/2024 1232   ALKPHOS 63 07/16/2024 1232   BILITOT 0.3 07/16/2024 1232   BILITOT 0.4 06/04/2024 1131   GFRNONAA >60 07/16/2024 1232   GFRAA >60 02/03/2020 1853   Lab Results  Component Value Date   CHOL 169 06/04/2024   HDL 42 06/04/2024   LDLCALC 105 (H) 06/04/2024   TRIG 121 06/04/2024   Lab Results  Component Value Date   HGBA1C 5.7 (H) 06/04/2024   Lab Results  Component Value Date   VITAMINB12 520 06/04/2024   Lab Results  Component Value Date   TSH 2.350 06/04/2024    CT HEAD IMPRESSION: 1. No acute intracranial abnormality. 2. Empty sella. While this finding is often incidental in nature and of no clinical significance, this can also be seen in the setting of idiopathic intracranial hypertension. 3. Otherwise unremarkable head CT.   CTA HEAD AND NECK  IMPRESSION: Normal CTA of the head and neck. No large vessel occlusion, hemodynamically significant stenosis, or other acute vascular abnormality. No aneurysm.  ASSESSMENT AND PLAN  48 y.o. year old female with past medical history of hypertension, anxiety, sleep apnea on CPAP.  Has been seen at our office for IIH.  She has been off Diamox  since at least May 2024 and doing well.  About 1 month ago she had return of headache, is a little bit different than typical IIH headache, without migraine features.  She has gained 20 pounds since last seen with BMI of 48. Has not had an LP.   - Check MRI of the brain with and without contrast given new type of headache - I will get the records from recent eye exam to ensure no mention of papilledema - I will reorder Diamox  ER, we will start at 500 mg daily vs 1000 mg for now, as higher dosing resulted in paraesthesia to extremities  - Continue to discuss weight loss options with primary care - I will follow-up with her once we get the results of above, if headaches worsen she is to contact me, follow-up in 6 months with me or Dr. Gregg  Meds ordered this encounter  Medications   acetaZOLAMIDE  ER (DIAMOX ) 500 MG capsule    Sig: Take 1 capsule (500 mg total) by mouth at bedtime.    Dispense:  90 capsule    Refill:  1   Addendum 08/01/24 SS: I received notes from [facility]. Normal ocular health without much elaboration. I called the patient. Claims has not started the Diamox . Not having any headaches right now. Given her history and concern for IIH, I am going to refer her to an ophthalmologist to have a thorough eye exam for evaluation of papilledema.  Lauraine Gayland MANDES, DNP  [facility] [address] [place], [place] [address] [phone]

## 2024-07-22 NOTE — Patient Instructions (Signed)
 I will get eye records Order MRI of the brain If headaches increase let me know and we can restart the Diamox  Follow up in 6 months

## 2024-07-22 NOTE — Telephone Encounter (Signed)
 Can we please request eye records from [facility] from last visit? Thanks

## 2024-07-24 ENCOUNTER — Telehealth: Payer: Self-pay | Admitting: Neurology

## 2024-07-24 NOTE — Telephone Encounter (Signed)
 [employer] shara: [phone] exp. 07/24/24-08/22/24 sent to GI for her weight. [phone]

## 2024-07-30 NOTE — Telephone Encounter (Signed)
 Adrien, can you see if you can get eye exam records. I haven't received anything yet. Thanks

## 2024-08-01 NOTE — Addendum Note (Signed)
 Addended by: GAYLAND LAURAINE PARAS on: 08/01/2024 11:48 AM   Modules accepted: Orders

## 2024-08-04 ENCOUNTER — Telehealth: Payer: Self-pay | Admitting: Neurology

## 2024-08-04 NOTE — Telephone Encounter (Signed)
 Referral for ophthalmology fax to [facility]. Phone: [phone], Fax: [phone]

## 2024-08-05 ENCOUNTER — Other Ambulatory Visit: Payer: Self-pay | Admitting: Nurse Practitioner

## 2024-08-05 DIAGNOSIS — E559 Vitamin D deficiency, unspecified: Secondary | ICD-10-CM

## 2024-08-06 ENCOUNTER — Ambulatory Visit: Admitting: Nurse Practitioner

## 2024-08-14 ENCOUNTER — Ambulatory Visit: Admitting: Nurse Practitioner

## 2024-08-15 DIAGNOSIS — R519 Headache, unspecified: Secondary | ICD-10-CM | POA: Diagnosis not present

## 2024-08-15 DIAGNOSIS — E782 Mixed hyperlipidemia: Secondary | ICD-10-CM | POA: Diagnosis not present

## 2024-08-15 DIAGNOSIS — H35033 Hypertensive retinopathy, bilateral: Secondary | ICD-10-CM | POA: Diagnosis not present

## 2024-08-15 DIAGNOSIS — I10 Essential (primary) hypertension: Secondary | ICD-10-CM | POA: Diagnosis not present

## 2024-08-15 DIAGNOSIS — H4710 Unspecified papilledema: Secondary | ICD-10-CM | POA: Diagnosis not present

## 2024-08-15 DIAGNOSIS — Z23 Encounter for immunization: Secondary | ICD-10-CM | POA: Diagnosis not present

## 2024-08-15 DIAGNOSIS — Z0000 Encounter for general adult medical examination without abnormal findings: Secondary | ICD-10-CM | POA: Diagnosis not present

## 2024-08-15 DIAGNOSIS — H02831 Dermatochalasis of right upper eyelid: Secondary | ICD-10-CM | POA: Diagnosis not present

## 2024-08-19 ENCOUNTER — Telehealth: Payer: Self-pay | Admitting: Neurology

## 2024-08-19 NOTE — Telephone Encounter (Signed)
 I got notes from Dr. Octavia 08/15/24 saw Clem, [place].  No ocular etiology for headache noted.  No papilledema on OCT or dilated exam.  No evidence of IIH.  Follow-up in 2 months.

## 2024-08-25 ENCOUNTER — Encounter: Payer: Self-pay | Admitting: Neurology

## 2024-08-27 ENCOUNTER — Ambulatory Visit: Admitting: Nurse Practitioner

## 2024-08-30 ENCOUNTER — Ambulatory Visit
Admission: RE | Admit: 2024-08-30 | Discharge: 2024-08-30 | Disposition: A | Discharge status: Home / Self Care | Source: Ambulatory Visit | Attending: Neurology | Admitting: Neurology

## 2024-08-30 DIAGNOSIS — R519 Headache, unspecified: Secondary | ICD-10-CM | POA: Diagnosis not present

## 2024-08-30 MED ORDER — GADOPICLENOL 0.5 MMOL/ML IV SOLN
10.0000 mL | Freq: Once | INTRAVENOUS | Status: AC | PRN
  Administered 2024-08-30: 10 mL via INTRAVENOUS

## 2024-09-01 ENCOUNTER — Telehealth: Payer: Self-pay | Admitting: Neurology

## 2024-09-01 ENCOUNTER — Ambulatory Visit: Payer: Self-pay | Admitting: Neurology

## 2024-09-01 DIAGNOSIS — G932 Benign intracranial hypertension: Secondary | ICD-10-CM

## 2024-09-01 DIAGNOSIS — R519 Headache, unspecified: Secondary | ICD-10-CM

## 2024-09-01 NOTE — Telephone Encounter (Signed)
 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     IMPRESSION: This MRI of the brain with and without contrast shows the following: The brain appears normal before and after contrast. 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  This was not present on the CT scan from 02/03/2020.  Large mucous retention cysts in the sphenoid sinus can lead to chronic headache. No acute findings.

## 2024-09-01 NOTE — Telephone Encounter (Signed)
Referral for ENT fax to [facility]. Phone: [phone], Fax: [phone].

## 2024-09-03 ENCOUNTER — Ambulatory Visit: Admitting: Nurse Practitioner

## 2024-09-03 ENCOUNTER — Encounter: Payer: Self-pay | Admitting: Nurse Practitioner

## 2024-09-03 VITALS — BP 137/83 | HR 86 | Temp 98.0°F | Ht 70.0 in | Wt 344.0 lb

## 2024-09-03 DIAGNOSIS — E559 Vitamin D deficiency, unspecified: Secondary | ICD-10-CM

## 2024-09-03 DIAGNOSIS — Z6841 Body Mass Index (BMI) 40.0 and over, adult: Secondary | ICD-10-CM | POA: Diagnosis not present

## 2024-09-03 DIAGNOSIS — E66813 Obesity, class 3: Secondary | ICD-10-CM | POA: Diagnosis not present

## 2024-09-03 DIAGNOSIS — R7303 Prediabetes: Secondary | ICD-10-CM

## 2024-09-03 NOTE — Progress Notes (Signed)
 Office: [phone]  /  Fax: [phone]  WEIGHT SUMMARY AND BIOMETRICS  Weight Lost Since Last Visit: 0lb  Weight Gained Since Last Visit: 0lb   Vitals Temp: 98 F (36.7 C) BP: 137/83 Pulse Rate: 86 SpO2: 100 %   Anthropometric Measurements Height: 5' 10 (1.778 m) Weight: (!) 344 lb (156 kg) BMI (Calculated): 49.36 Weight at Last Visit: 344lb Weight Lost Since Last Visit: 0lb Weight Gained Since Last Visit: 0lb Starting Weight: 340lb Total Weight Loss (lbs): 0 lb (0 kg)   Body Composition  Body Fat %: 53.2 % Fat Mass (lbs): 183.4 lbs Muscle Mass (lbs): 153.2 lbs Total Body Water (lbs): 111.6 lbs Visceral Fat Rating : 19   Other Clinical Data Fasting: No Labs: No Today's Visit #: 29 Starting Date: 04/20/21     HPI  Chief Complaint: OBESITY  Braelynne is here to discuss her progress with her obesity treatment plan. She is on the following a lower carbohydrate, vegetable and lean protein rich diet plan and states she is following her eating plan approximately 0 % of the time. She states she is exercising 30 minutes 2-3 days per week.   Interval History:  Since last office visit she has maintained her weight. She has gained 4 lbs since her initial visit.  She is trying to avoid baked goods and sweets.  She is drinking tea, more water with flavoring.  She rarely drinks juice or sodas.  She is exercising 2-3 days per week. She is walking on the treadmill with a 2% incline for 18 minutes and upper body weights 2-3 days per week.    Her highest was 360lbs.   BF:  chick fil a egg white chicken sandwich with an extra egg and tater tots-half and half tea Snack:  sometimes crackers-water and or tea Lunch:  skipping Snack:  crackers or cottage cheese with blueberries-water or tea Dinner:  protein, vegetables, carb-tea Snack:  none   Pharmacotherapy for weight loss: She is not currently taking a medication for medical weight loss.    Previous pharmacotherapy  for medical weight loss:   She has tried Topamax in the past for cravings and doesn't remember why she stopped taking it.   She didn't start GLP-1 or another weight loss medication per GI due to history of constipation-she has an appt with GI to discuss next week   Bariatric surgery:  Patient has not had bariatric surgery   Prediabetes Last A1c was 5.7  Medication(s): none Polyphagia:Yes Lab Results  Component Value Date   HGBA1C 5.7 (H) 06/04/2024   HGBA1C 5.5 01/15/2023   HGBA1C 5.5 06/07/2022   HGBA1C 5.6 05/03/2021   Lab Results  Component Value Date   INSULIN  15.0 01/15/2023   INSULIN  15.1 06/07/2022   INSULIN  22.4 05/03/2021   Vit D deficiency  She is not currently taking Vit D 50,000 IU weekly.  Took last dose in Oct.    Lab Results  Component Value Date   VD25OH 23.3 (L) 06/04/2024   VD25OH 43.2 07/26/2023   VD25OH 29.8 (L) 01/15/2023     PHYSICAL EXAM:  Blood pressure 137/83, pulse 86, temperature 98 F (36.7 C), height 5' 10 (1.778 m), weight (!) 344 lb (156 kg), last menstrual period 08/25/2024, SpO2 100%. Body mass index is 49.36 kg/m.  General: She is overweight, cooperative, alert, well developed, and in no acute distress. PSYCH: Has normal mood, affect and thought process.   Extremities: No edema.  Neurologic: No gross sensory or motor deficits.  No tremors or fasciculations noted.    DIAGNOSTIC DATA REVIEWED:  BMET    Component Value Date/Time   NA 139 07/16/2024 1232   NA 137 06/04/2024 1131   K 3.6 07/16/2024 1232   CL 103 07/16/2024 1232   CO2 24 07/16/2024 1232   GLUCOSE 93 07/16/2024 1232   BUN 11 07/16/2024 1232   BUN 12 06/04/2024 1131   CREATININE 0.69 07/16/2024 1232   CALCIUM 9.1 07/16/2024 1232   GFRNONAA >60 07/16/2024 1232   GFRAA >60 02/03/2020 1853   Lab Results  Component Value Date   HGBA1C 5.7 (H) 06/04/2024   HGBA1C 5.6 05/03/2021   Lab Results  Component Value Date   INSULIN  15.0 01/15/2023   INSULIN  22.4  05/03/2021   Lab Results  Component Value Date   TSH 2.350 06/04/2024   CBC    Component Value Date/Time   WBC 5.7 07/16/2024 1232   RBC 4.43 07/16/2024 1232   HGB 12.1 07/16/2024 1232   HGB 12.0 06/04/2024 1131   HCT 37.9 07/16/2024 1232   HCT 39.1 06/04/2024 1131   PLT 226 07/16/2024 1232   PLT 219 06/04/2024 1131   MCV 85.6 07/16/2024 1232   MCV 90 06/04/2024 1131   MCH 27.3 07/16/2024 1232   MCHC 31.9 07/16/2024 1232   RDW 14.6 07/16/2024 1232   RDW 13.2 06/04/2024 1131   Iron Studies    Component Value Date/Time   FERRITIN 120 01/15/2023 1156   Lipid Panel     Component Value Date/Time   CHOL 169 06/04/2024 1131   TRIG 121 06/04/2024 1131   HDL 42 06/04/2024 1131   LDLCALC 105 (H) 06/04/2024 1131   Hepatic Function Panel     Component Value Date/Time   PROT 8.1 07/16/2024 1232   PROT 7.5 06/04/2024 1131   ALBUMIN 4.2 07/16/2024 1232   ALBUMIN 4.2 06/04/2024 1131   AST 23 07/16/2024 1232   ALT 15 07/16/2024 1232   ALKPHOS 63 07/16/2024 1232   BILITOT 0.3 07/16/2024 1232   BILITOT 0.4 06/04/2024 1131      Component Value Date/Time   TSH 2.350 06/04/2024 1131   Nutritional Lab Results  Component Value Date   VD25OH 23.3 (L) 06/04/2024   VD25OH 43.2 07/26/2023   VD25OH 29.8 (L) 01/15/2023     ASSESSMENT AND PLAN  TREATMENT PLAN FOR OBESITY:  Recommended Dietary Goals  Meghana is currently in the action stage of change. As such, her goal is to continue weight management plan. She has agreed to practicing portion control and making smarter food choices, such as increasing vegetables and decreasing simple carbohydrates.  She is not eating enough protein and is eating too many carbs.  She would benefit from tracking.    Behavioral Intervention  We discussed the following Behavioral Modification Strategies today: increasing lean protein intake to established goals, decreasing simple carbohydrates , increasing vegetables, increasing fiber rich  foods, avoiding skipping meals, increasing water intake , work on meal planning and preparation, reading food labels , keeping healthy foods at home, planning for success, better snacking choices, celebration eating strategies, continue to work on maintaining a reduced calorie state, getting the recommended amount of protein, incorporating whole foods, making healthy choices, staying well hydrated and practicing mindfulness when eating., and increase protein intake, fibrous foods (25 grams per day for women, 30 grams for men) and water to improve satiety and decrease hunger signals. .  Additional resources provided today: NA  Recommended Physical Activity Goals  Dyanna has  been advised to work up to 150 minutes of moderate intensity aerobic activity a week and strengthening exercises 2-3 times per week for cardiovascular health, weight loss maintenance and preservation of muscle mass.   She has agreed to Think about enjoyable ways to increase daily physical activity and overcoming barriers to exercise, Increase physical activity in their day and reduce sedentary time (increase NEAT)., Continue to gradually increase the amount and intensity of exercise routine, Increase volume of physical activity to a goal of 240 minutes a week, and Combine aerobic and strengthening exercises for efficiency and improved cardiometabolic health.   Pharmacotherapy To discuss options with GI  ASSOCIATED CONDITIONS ADDRESSED TODAY  Action/Plan  Prediabetes -     Hemoglobin A1c  Vitamin D  deficiency -     VITAMIN D  25 Hydroxy (Vit-D Deficiency, Fractures)  Obesity, Class III, BMI 40-49.9 (morbid obesity) (HCC)   We discussed weight loss medications and bariatric surgery today. She has gained 4 lbs since her initial visit.  Patient to discuss weight loss meds with GI and will consider her options.       Return in about 4 weeks (around 10/01/2024).SABRA She was informed of the importance of frequent follow up  visits to maximize her success with intensive lifestyle modifications for her multiple health conditions.   ATTESTASTION STATEMENTS:  Reviewed by clinician on day of visit: allergies, medications, problem list, medical history, surgical history, family history, social history, and previous encounter notes.   I personally spent a total of 32 minutes in the care of the patient today including preparing to see the patient, getting/reviewing separately obtained history, performing a medically appropriate exam/evaluation, counseling and educating, and referring and communicating with other health care professionals.    Corean SAUNDERS. Maurie Olesen FNP-C

## 2024-09-04 LAB — HEMOGLOBIN A1C
Est. average glucose Bld gHb Est-mCnc: 114 mg/dL
Hgb A1c MFr Bld: 5.6 % (ref 4.8–5.6)

## 2024-09-04 LAB — VITAMIN D 25 HYDROXY (VIT D DEFICIENCY, FRACTURES): Vit D, 25-Hydroxy: 30.3 ng/mL (ref 30.0–100.0)

## 2024-09-09 DIAGNOSIS — K5904 Chronic idiopathic constipation: Secondary | ICD-10-CM | POA: Diagnosis not present

## 2024-09-19 ENCOUNTER — Ambulatory Visit: Admitting: Neurology

## 2024-10-01 ENCOUNTER — Encounter: Payer: Self-pay | Admitting: Nurse Practitioner

## 2024-10-01 ENCOUNTER — Ambulatory Visit: Admitting: Nurse Practitioner

## 2024-10-01 VITALS — BP 124/84 | HR 78 | Temp 98.4°F | Ht 70.0 in | Wt 341.0 lb

## 2024-10-01 DIAGNOSIS — K5909 Other constipation: Secondary | ICD-10-CM | POA: Diagnosis not present

## 2024-10-01 DIAGNOSIS — Z87898 Personal history of other specified conditions: Secondary | ICD-10-CM

## 2024-10-01 DIAGNOSIS — E66813 Obesity, class 3: Secondary | ICD-10-CM

## 2024-10-01 DIAGNOSIS — Z8639 Personal history of other endocrine, nutritional and metabolic disease: Secondary | ICD-10-CM

## 2024-10-01 DIAGNOSIS — Z6841 Body Mass Index (BMI) 40.0 and over, adult: Secondary | ICD-10-CM

## 2024-10-01 DIAGNOSIS — E559 Vitamin D deficiency, unspecified: Secondary | ICD-10-CM

## 2024-10-01 MED ORDER — VITAMIN D (ERGOCALCIFEROL) 1.25 MG (50000 UNIT) PO CAPS: 50000.0000 [IU] | ORAL_CAPSULE | ORAL | 0 refills | Status: AC

## 2024-10-01 MED ORDER — METFORMIN HCL 500 MG PO TABS: 500.0000 mg | ORAL_TABLET | Freq: Every day | ORAL | 0 refills | Status: DC

## 2024-10-01 NOTE — Progress Notes (Signed)
 Office: [phone]  /  Fax: [phone]  WEIGHT SUMMARY AND BIOMETRICS  Weight Lost Since Last Visit: 3lb  Weight Gained Since Last Visit: 0lb   Vitals Temp: 98.4 F (36.9 C) BP: 124/84 Pulse Rate: 78 SpO2: 99 %   Anthropometric Measurements Height: 5' 10 (1.778 m) Weight: (!) 341 lb (154.7 kg) BMI (Calculated): 48.93 Weight at Last Visit: 344lb Weight Lost Since Last Visit: 3lb Weight Gained Since Last Visit: 0lb Starting Weight: 340lb Total Weight Loss (lbs): 0 lb (0 kg)   Body Composition  Body Fat %: 52.4 % Fat Mass (lbs): 179 lbs Muscle Mass (lbs): 154.4 lbs Total Body Water (lbs): 107.6 lbs Visceral Fat Rating : 19   Other Clinical Data Fasting: No Labs: No Today's Visit #: 30 Starting Date: 04/20/21     HPI  Chief Complaint: OBESITY  Julia Mccoy is here to discuss her progress with her obesity treatment plan. She is on the following a lower carbohydrate, vegetable and lean protein rich diet plan and states she is following her eating plan approximately 50 % of the time. She states she is exercising 20 minutes 2-3 days per week.   Interval History:  Since last office visit she has lost 3 pounds.  She hasn't been able to follow her meal plan due to having dental work over the past 2 weeks. She is drinking water with lemon flavoring, hot tea and is trying to drink more unsweetened tea instead of sweetened tea.  She is walking her dog and walking on the treadmill 2-3 days per week.  She is doing resistance training 2-3 days per week.    Her highest was 360lbs.   Pharmacotherapy for weight loss: She is not currently taking a medication for medical weight loss.    Previous pharmacotherapy for medical weight loss:   She has tried Topamax in the past for cravings and doesn't remember why she stopped taking it.    She didn't start GLP-1 or another weight loss medication per GI due to history of chronic constipation. She saw GI on 09/09/24.  Per Dr.  Lindaann I do not see any contraindication to her starting a GLP agonist, and I have encouraged her to move forward with this. If she suffers from worsening reflux or bowel issues, she was encouraged to contact me and I will work with her to eliminate these.  Copied directly from his note.     Bariatric surgery:  Patient has not had bariatric surgery   Vit D deficiency  She is not taking Vit D 50,000 IU weekly-stopped taking 1 month ago.  Denies side effects.  Denies nausea, vomiting or muscle weakness.    Lab Results  Component Value Date   VD25OH 30.3 09/03/2024   VD25OH 23.3 (L) 06/04/2024   VD25OH 43.2 07/26/2023    Prediabetes Last A1c was 5.6-improved Medication(s): None Polyphagia:Yes, notes also cravings Lab Results  Component Value Date   HGBA1C 5.6 09/03/2024   HGBA1C 5.7 (H) 06/04/2024   HGBA1C 5.5 01/15/2023   HGBA1C 5.5 06/07/2022   HGBA1C 5.6 05/03/2021   Lab Results  Component Value Date   INSULIN  15.0 01/15/2023   INSULIN  15.1 06/07/2022   INSULIN  22.4 05/03/2021    PHYSICAL EXAM:  Blood pressure 124/84, pulse 78, temperature 98.4 F (36.9 C), height 5' 10 (1.778 m), weight (!) 341 lb (154.7 kg), last menstrual period 09/24/2024, SpO2 99%. Body mass index is 48.93 kg/m.  General: She is overweight, cooperative, alert, well developed, and in no  acute distress. PSYCH: Has normal mood, affect and thought process.   Extremities: No edema.  Neurologic: No gross sensory or motor deficits. No tremors or fasciculations noted.    DIAGNOSTIC DATA REVIEWED:  BMET    Component Value Date/Time   NA 139 07/16/2024 1232   NA 137 06/04/2024 1131   K 3.6 07/16/2024 1232   CL 103 07/16/2024 1232   CO2 24 07/16/2024 1232   GLUCOSE 93 07/16/2024 1232   BUN 11 07/16/2024 1232   BUN 12 06/04/2024 1131   CREATININE 0.69 07/16/2024 1232   CALCIUM 9.1 07/16/2024 1232   GFRNONAA >60 07/16/2024 1232   GFRAA >60 02/03/2020 1853   Lab Results  Component Value Date    HGBA1C 5.6 09/03/2024   HGBA1C 5.6 05/03/2021   Lab Results  Component Value Date   INSULIN  15.0 01/15/2023   INSULIN  22.4 05/03/2021   Lab Results  Component Value Date   TSH 2.350 06/04/2024   CBC    Component Value Date/Time   WBC 5.7 07/16/2024 1232   RBC 4.43 07/16/2024 1232   HGB 12.1 07/16/2024 1232   HGB 12.0 06/04/2024 1131   HCT 37.9 07/16/2024 1232   HCT 39.1 06/04/2024 1131   PLT 226 07/16/2024 1232   PLT 219 06/04/2024 1131   MCV 85.6 07/16/2024 1232   MCV 90 06/04/2024 1131   MCH 27.3 07/16/2024 1232   MCHC 31.9 07/16/2024 1232   RDW 14.6 07/16/2024 1232   RDW 13.2 06/04/2024 1131   Iron Studies    Component Value Date/Time   FERRITIN 120 01/15/2023 1156   Lipid Panel     Component Value Date/Time   CHOL 169 06/04/2024 1131   TRIG 121 06/04/2024 1131   HDL 42 06/04/2024 1131   LDLCALC 105 (H) 06/04/2024 1131   Hepatic Function Panel     Component Value Date/Time   PROT 8.1 07/16/2024 1232   PROT 7.5 06/04/2024 1131   ALBUMIN 4.2 07/16/2024 1232   ALBUMIN 4.2 06/04/2024 1131   AST 23 07/16/2024 1232   ALT 15 07/16/2024 1232   ALKPHOS 63 07/16/2024 1232   BILITOT 0.3 07/16/2024 1232   BILITOT 0.4 06/04/2024 1131      Component Value Date/Time   TSH 2.350 06/04/2024 1131   Nutritional Lab Results  Component Value Date   VD25OH 30.3 09/03/2024   VD25OH 23.3 (L) 06/04/2024   VD25OH 43.2 07/26/2023     ASSESSMENT AND PLAN  TREATMENT PLAN FOR OBESITY:  Recommended Dietary Goals  Latona is currently in the action stage of change. As such, her goal is to continue weight management plan. She has agreed to practicing portion control and making smarter food choices, such as increasing vegetables and decreasing simple carbohydrates.  Behavioral Intervention  We discussed the following Behavioral Modification Strategies today: increasing lean protein intake to established goals, decreasing simple carbohydrates , increasing  vegetables, increasing fiber rich foods, increasing water intake , work on meal planning and preparation, reading food labels , keeping healthy foods at home, planning for success, celebration eating strategies, continue to work on maintaining a reduced calorie state, getting the recommended amount of protein, incorporating whole foods, making healthy choices, staying well hydrated and practicing mindfulness when eating., and increase protein intake, fibrous foods (25 grams per day for women, 30 grams for men) and water to improve satiety and decrease hunger signals. .  Additional resources provided today: NA  Recommended Physical Activity Goals  Lakeva has been advised to work up to  150 minutes of moderate intensity aerobic activity a week and strengthening exercises 2-3 times per week for cardiovascular health, weight loss maintenance and preservation of muscle mass.   She has agreed to Think about enjoyable ways to increase daily physical activity and overcoming barriers to exercise, Increase physical activity in their day and reduce sedentary time (increase NEAT)., Continue to gradually increase the amount and intensity of exercise routine, Increase volume of physical activity to a goal of 240 minutes a week, and Combine aerobic and strengthening exercises for efficiency and improved cardiometabolic health.   Pharmacotherapy We discussed various medication options to help Suanne with her weight loss efforts and she has elected to start Metformin .  Will consider GLP 1 in the future.  Will discuss at next visit.    ASSOCIATED CONDITIONS ADDRESSED TODAY  Action/Plan  Chronic constipation Continue to follow up with GI  Vitamin D  deficiency -     Start Vitamin D  (Ergocalciferol ); Take 1 capsule (50,000 Units total) by mouth every 7 (seven) days.  Dispense: 4 capsule; Refill: 0. Side effects discussed   History of prediabetes -     Start metFORMIN  HCl; Take 1 tablet (500 mg total) by mouth  daily with breakfast.  Dispense: 30 tablet; Refill: 0.  Side effects discussed   Obesity, Class III, BMI 40-49.9 (morbid obesity) (HCC)      Labs reviewed in chart with patient from 09/03/24.  All questions answered    Return in about 4 weeks (around 10/29/2024).SABRA She was informed of the importance of frequent follow up visits to maximize her success with intensive lifestyle modifications for her multiple health conditions.   ATTESTASTION STATEMENTS:  Reviewed by clinician on day of visit: allergies, medications, problem list, medical history, surgical history, family history, social history, and previous encounter notes.    Corean SAUNDERS. Lashaunta Sicard FNP-C

## 2024-10-15 DIAGNOSIS — R519 Headache, unspecified: Secondary | ICD-10-CM | POA: Diagnosis not present

## 2024-10-15 DIAGNOSIS — H02831 Dermatochalasis of right upper eyelid: Secondary | ICD-10-CM | POA: Diagnosis not present

## 2024-10-15 DIAGNOSIS — H02834 Dermatochalasis of left upper eyelid: Secondary | ICD-10-CM | POA: Diagnosis not present

## 2024-10-15 DIAGNOSIS — H4710 Unspecified papilledema: Secondary | ICD-10-CM | POA: Diagnosis not present

## 2024-10-22 ENCOUNTER — Telehealth: Payer: Self-pay | Admitting: Neurology

## 2024-10-22 NOTE — Telephone Encounter (Signed)
 Note from [facility] received. Visit dated 10/15/24. Note placed in Sarah's office for review.

## 2024-10-22 NOTE — Telephone Encounter (Signed)
 I received ophthalmology evaluation from [facility] 10/15/24 from [place] [facility].  No evidence of IIH.  Take Diamox  when headaches occur, HVF full bilaterally.  No ocular etiology found for headache.

## 2024-10-29 ENCOUNTER — Other Ambulatory Visit: Payer: Self-pay | Admitting: Nurse Practitioner

## 2024-10-29 DIAGNOSIS — Z87898 Personal history of other specified conditions: Secondary | ICD-10-CM

## 2024-11-05 ENCOUNTER — Ambulatory Visit: Admitting: Nurse Practitioner

## 2024-11-05 ENCOUNTER — Encounter: Payer: Self-pay | Admitting: Nurse Practitioner

## 2024-11-05 VITALS — BP 127/82 | HR 67 | Temp 98.1°F | Ht 70.0 in | Wt 343.0 lb

## 2024-11-05 DIAGNOSIS — R7303 Prediabetes: Secondary | ICD-10-CM

## 2024-11-05 DIAGNOSIS — Z6841 Body Mass Index (BMI) 40.0 and over, adult: Secondary | ICD-10-CM

## 2024-11-05 DIAGNOSIS — E66813 Obesity, class 3: Secondary | ICD-10-CM

## 2024-11-05 NOTE — Progress Notes (Addendum)
 "  Office: [phone]  /  Fax: [phone]  WEIGHT SUMMARY AND BIOMETRICS  Weight Lost Since Last Visit: 0lb  Weight Gained Since Last Visit: 2lb   Vitals Temp: 98.1 F (36.7 C) BP: 127/82 Pulse Rate: 67 SpO2: 100 %   Anthropometric Measurements Height: 5' 10 (1.778 m) Weight: (!) 343 lb (155.6 kg) BMI (Calculated): 49.22 Weight at Last Visit: 341lb Weight Lost Since Last Visit: 0lb Weight Gained Since Last Visit: 2lb Starting Weight: 340lb Total Weight Loss (lbs): 0 lb (0 kg)   Body Composition  Body Fat %: 52.7 % Fat Mass (lbs): 180.8 lbs Muscle Mass (lbs): 154 lbs Total Body Water (lbs): 107.2 lbs Visceral Fat Rating : 19   Other Clinical Data Fasting: No Labs: No Today's Visit #: 31 Starting Date: 04/20/21     HPI  Chief Complaint: OBESITY  Julia Mccoy is here to discuss her progress with her obesity treatment plan. She is on the following a lower carbohydrate, vegetable and lean protein rich diet plan and states she is following her eating plan approximately 70 % of the time. She states she is exercising 20-30 minutes 3-4 days per week.   Interval History:  Since last office visit she has gained 2 pounds.  She notes she is not consistant such as following a meal plan or exercising.  She has had a net weight gain of 3 lbs since her initial visit.   Her highest was 360lbs.    Pharmacotherapy for weight loss: She is not currently taking a medication for medical weight loss.    Previous pharmacotherapy for medical weight loss:   She has tried Topamax in the past for cravings and doesn't remember why she stopped taking it.    She didn't start GLP-1 or another weight loss medication per GI due to history of chronic constipation. She saw GI on 09/09/24.  Per Dr. Lindaann I do not see any contraindication to her starting a GLP agonist, and I have encouraged her to move forward with this. If she suffers from worsening reflux or bowel issues, she was  encouraged to contact me and I will work with her to eliminate these.  Copied directly from his note.     Bariatric surgery:  Patient has not had bariatric surgery   Prediabetes Last A1c was 5.6  Medication(s): started taking Metformin  3 days ago.  Reports side effects of headaches.   Polyphagia:Yes Lab Results  Component Value Date   HGBA1C 5.6 09/03/2024   HGBA1C 5.7 (H) 06/04/2024   HGBA1C 5.5 01/15/2023   HGBA1C 5.5 06/07/2022   HGBA1C 5.6 05/03/2021   Lab Results  Component Value Date   INSULIN  15.0 01/15/2023   INSULIN  15.1 06/07/2022   INSULIN  22.4 05/03/2021     PHYSICAL EXAM:  Blood pressure 127/82, pulse 67, temperature 98.1 F (36.7 C), height 5' 10 (1.778 m), weight (!) 343 lb (155.6 kg), last menstrual period 10/23/2024, SpO2 100%. Body mass index is 49.22 kg/m.  General: She is overweight, cooperative, alert, well developed, and in no acute distress. PSYCH: Has normal mood, affect and thought process.   Extremities: No edema.  Neurologic: No gross sensory or motor deficits. No tremors or fasciculations noted.    DIAGNOSTIC DATA REVIEWED:  BMET    Component Value Date/Time   NA 139 07/16/2024 1232   NA 137 06/04/2024 1131   K 3.6 07/16/2024 1232   CL 103 07/16/2024 1232   CO2 24 07/16/2024 1232   GLUCOSE 93 07/16/2024 1232  BUN 11 07/16/2024 1232   BUN 12 06/04/2024 1131   CREATININE 0.69 07/16/2024 1232   CALCIUM 9.1 07/16/2024 1232   GFRNONAA >60 07/16/2024 1232   GFRAA >60 02/03/2020 1853   Lab Results  Component Value Date   HGBA1C 5.6 09/03/2024   HGBA1C 5.6 05/03/2021   Lab Results  Component Value Date   INSULIN  15.0 01/15/2023   INSULIN  22.4 05/03/2021   Lab Results  Component Value Date   TSH 2.350 06/04/2024   CBC    Component Value Date/Time   WBC 5.7 07/16/2024 1232   RBC 4.43 07/16/2024 1232   HGB 12.1 07/16/2024 1232   HGB 12.0 06/04/2024 1131   HCT 37.9 07/16/2024 1232   HCT 39.1 06/04/2024 1131   PLT 226  07/16/2024 1232   PLT 219 06/04/2024 1131   MCV 85.6 07/16/2024 1232   MCV 90 06/04/2024 1131   MCH 27.3 07/16/2024 1232   MCHC 31.9 07/16/2024 1232   RDW 14.6 07/16/2024 1232   RDW 13.2 06/04/2024 1131   Iron Studies    Component Value Date/Time   FERRITIN 120 01/15/2023 1156   Lipid Panel     Component Value Date/Time   CHOL 169 06/04/2024 1131   TRIG 121 06/04/2024 1131   HDL 42 06/04/2024 1131   LDLCALC 105 (H) 06/04/2024 1131   Hepatic Function Panel     Component Value Date/Time   PROT 8.1 07/16/2024 1232   PROT 7.5 06/04/2024 1131   ALBUMIN 4.2 07/16/2024 1232   ALBUMIN 4.2 06/04/2024 1131   AST 23 07/16/2024 1232   ALT 15 07/16/2024 1232   ALKPHOS 63 07/16/2024 1232   BILITOT 0.3 07/16/2024 1232   BILITOT 0.4 06/04/2024 1131      Component Value Date/Time   TSH 2.350 06/04/2024 1131   Nutritional Lab Results  Component Value Date   VD25OH 30.3 09/03/2024   VD25OH 23.3 (L) 06/04/2024   VD25OH 43.2 07/26/2023     ASSESSMENT AND PLAN  TREATMENT PLAN FOR OBESITY:  Recommended Dietary Goals  Julia Mccoy is currently in the action stage of change. As such, her goal is to continue weight management plan. She has agreed to keeping a food journal and adhering to recommended goals of 1500-1700 calories and 90+ grams of protein.  Behavioral Intervention  We discussed the following Behavioral Modification Strategies today: increasing lean protein intake to established goals, increasing vegetables, increasing fiber rich foods, increasing water intake , work on tracking and journaling calories using tracking application, reading food labels , keeping healthy foods at home, continue to work on maintaining a reduced calorie state, getting the recommended amount of protein, incorporating whole foods, making healthy choices, staying well hydrated and practicing mindfulness when eating., and increase protein intake, fibrous foods (25 grams per day for women, 30 grams for  men) and water to improve satiety and decrease hunger signals. .  Additional resources provided today: NA  Recommended Physical Activity Goals  Julia Mccoy has been advised to work up to 150 minutes of moderate intensity aerobic activity a week and strengthening exercises 2-3 times per week for cardiovascular health, weight loss maintenance and preservation of muscle mass.   She has agreed to Think about enjoyable ways to increase daily physical activity and overcoming barriers to exercise, Increase physical activity in their day and reduce sedentary time (increase NEAT)., Work on scheduling and tracking physical activity. , Continue to gradually increase the amount and intensity of exercise routine, Increase volume of physical activity to a goal  of 240 minutes a week, and Combine aerobic and strengthening exercises for efficiency and improved cardiometabolic health.   Pharmacotherapy We discussed various medication options to help Julia Mccoy with her weight loss efforts and we both agreed to consider her options.  ASSOCIATED CONDITIONS ADDRESSED TODAY  Action/Plan  Pre-diabetes Continue Metformin  daily.  Side effects discussed.   Obesity, Class III, BMI 40-49.9 (morbid obesity) (HCC)      Goals: Take Metformin  on a regular basis Will consider GLP1 at next visit if not losing weight  Discussed bariatric surgery  We had a long discussion about her next best plan of care.  We discussed her barriers to weight loss.  Discussed referral to RD.     Return in about 4 weeks (around 12/03/2024).Julia Mccoy She was informed of the importance of frequent follow up visits to maximize her success with intensive lifestyle modifications for her multiple health conditions.   ATTESTASTION STATEMENTS:  Reviewed by clinician on day of visit: allergies, medications, problem list, medical history, surgical history, family history, social history, and previous encounter notes.    I personally spent a total of 41  minutes in the care of the patient today including preparing to see the patient, getting/reviewing separately obtained history, counseling and educating, and documenting clinical information in the EHR.   Julia Mccoy. Julia Plouffe FNP-C "

## 2024-12-10 ENCOUNTER — Ambulatory Visit: Admitting: Nurse Practitioner

## 2025-02-16 ENCOUNTER — Ambulatory Visit: Admitting: Neurology
